# Patient Record
Sex: Female | Born: 1995
Health system: Southern US, Community
[De-identification: ages and names within clinical notes are randomized; demographics above are authoritative.]

## PROBLEM LIST (undated history)

## (undated) DIAGNOSIS — A749 Chlamydial infection, unspecified: Secondary | ICD-10-CM

## (undated) DIAGNOSIS — B009 Herpesviral infection, unspecified: Secondary | ICD-10-CM

## (undated) DIAGNOSIS — B3731 Acute candidiasis of vulva and vagina: Secondary | ICD-10-CM

## (undated) DIAGNOSIS — Z8616 Personal history of COVID-19: Secondary | ICD-10-CM

## (undated) DIAGNOSIS — Z973 Presence of spectacles and contact lenses: Secondary | ICD-10-CM

## (undated) DIAGNOSIS — Z8619 Personal history of other infectious and parasitic diseases: Secondary | ICD-10-CM

## (undated) DIAGNOSIS — A549 Gonococcal infection, unspecified: Secondary | ICD-10-CM

## (undated) DIAGNOSIS — B373 Candidiasis of vulva and vagina: Secondary | ICD-10-CM

## (undated) HISTORY — DX: Candidiasis of vulva and vagina: B37.3

## (undated) HISTORY — DX: Acute candidiasis of vulva and vagina: B37.31

## (undated) HISTORY — DX: Herpesviral infection, unspecified: B00.9

## (undated) HISTORY — DX: Personal history of COVID-19: Z86.16

## (undated) HISTORY — DX: Chlamydial infection, unspecified: A74.9

## (undated) HISTORY — DX: Gonococcal infection, unspecified: A54.9

---
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
## 2017-01-06 DIAGNOSIS — Z3200 Encounter for pregnancy test, result unknown: Secondary | ICD-10-CM | POA: Diagnosis not present

## 2017-01-06 DIAGNOSIS — A084 Viral intestinal infection, unspecified: Secondary | ICD-10-CM | POA: Diagnosis not present

## 2017-01-06 DIAGNOSIS — Z3009 Encounter for other general counseling and advice on contraception: Secondary | ICD-10-CM | POA: Diagnosis not present

## 2017-01-06 DIAGNOSIS — Z1389 Encounter for screening for other disorder: Secondary | ICD-10-CM | POA: Diagnosis not present

## 2017-01-06 DIAGNOSIS — R1084 Generalized abdominal pain: Secondary | ICD-10-CM | POA: Diagnosis not present

## 2017-01-30 DIAGNOSIS — N760 Acute vaginitis: Secondary | ICD-10-CM | POA: Diagnosis not present

## 2017-01-31 DIAGNOSIS — N760 Acute vaginitis: Secondary | ICD-10-CM | POA: Diagnosis not present

## 2017-03-01 ENCOUNTER — Emergency Department (HOSPITAL_COMMUNITY)
Admission: EM | Admit: 2017-03-01 | Discharge: 2017-03-02 | Disposition: A | Discharge status: Home / Self Care | Payer: Federal, State, Local not specified - PPO | Attending: Emergency Medicine | Admitting: Emergency Medicine

## 2017-03-01 ENCOUNTER — Encounter (HOSPITAL_COMMUNITY): Payer: Self-pay | Admitting: Emergency Medicine

## 2017-03-01 DIAGNOSIS — F1010 Alcohol abuse, uncomplicated: Secondary | ICD-10-CM | POA: Diagnosis not present

## 2017-03-01 DIAGNOSIS — E876 Hypokalemia: Secondary | ICD-10-CM | POA: Insufficient documentation

## 2017-03-01 DIAGNOSIS — E860 Dehydration: Secondary | ICD-10-CM | POA: Insufficient documentation

## 2017-03-01 DIAGNOSIS — R1110 Vomiting, unspecified: Secondary | ICD-10-CM | POA: Diagnosis present

## 2017-03-01 MED ORDER — LORAZEPAM 2 MG/ML IJ SOLN: 1.0000 mg | Freq: Once | INTRAMUSCULAR | Status: DC

## 2017-03-01 MED ORDER — SODIUM CHLORIDE 0.9 % IV BOLUS (SEPSIS)
2000.0000 mL | Freq: Once | INTRAVENOUS | Status: AC
  Administered 2017-03-02: 2000 mL via INTRAVENOUS

## 2017-03-01 MED ORDER — ONDANSETRON HCL 4 MG/2ML IJ SOLN: 4.0000 mg | Freq: Once | INTRAMUSCULAR | Status: DC

## 2017-03-01 NOTE — ED Triage Notes (Signed)
Pt presents to ER with vomiting, etoh intoxication; pt states she took 6 shots of hennessy; states she made her own drinks and does not believe anyone tainted with drinks; pt vomiting in triage; pt states she is having CP and cannot breathe, pt hyperventilating;  Sister here to help answer questions and comfort patient

## 2017-03-02 LAB — BASIC METABOLIC PANEL
Anion gap: 17 — ABNORMAL HIGH (ref 5–15)
BUN: 11 mg/dL (ref 6–20)
CHLORIDE: 104 mmol/L (ref 101–111)
CO2: 16 mmol/L — ABNORMAL LOW (ref 22–32)
Calcium: 9.1 mg/dL (ref 8.9–10.3)
Creatinine, Ser: 0.72 mg/dL (ref 0.44–1.00)
GFR calc Af Amer: >60 mL/min (ref >60)
GFR calc non Af Amer: >60 mL/min (ref >60)
GLUCOSE: 127 mg/dL — AB (ref 65–99)
POTASSIUM: 2.6 mmol/L — AB (ref 3.5–5.1)
SODIUM: 137 mmol/L (ref 135–145)

## 2017-03-02 LAB — I-STAT CHEM 8, ED
BUN: 6 mg/dL (ref 6–20)
CALCIUM ION: 1.04 mmol/L — AB (ref 1.15–1.40)
CHLORIDE: 113 mmol/L — AB (ref 101–111)
CREATININE: 0.6 mg/dL (ref 0.44–1.00)
GLUCOSE: 106 mg/dL — AB (ref 65–99)
HCT: 36 % (ref 36.0–46.0)
HEMOGLOBIN: 12.2 g/dL (ref 12.0–15.0)
POTASSIUM: 4.6 mmol/L (ref 3.5–5.1)
Sodium: 143 mmol/L (ref 135–145)
TCO2: 20 mmol/L (ref 0–100)

## 2017-03-02 LAB — CBC
HCT: 40 % (ref 36.0–46.0)
Hemoglobin: 13.9 g/dL (ref 12.0–15.0)
MCH: 30.9 pg (ref 26.0–34.0)
MCHC: 34.8 g/dL (ref 30.0–36.0)
MCV: 88.9 fL (ref 78.0–100.0)
Platelets: 240 10*3/uL (ref 150–400)
RBC: 4.5 MIL/uL (ref 3.87–5.11)
RDW: 12.2 % (ref 11.5–15.5)
WBC: 13.7 10*3/uL — AB (ref 4.0–10.5)

## 2017-03-02 LAB — I-STAT TROPONIN, ED: Troponin i, poc: 0 ng/mL (ref 0.00–0.08)

## 2017-03-02 LAB — ETHANOL: ALCOHOL ETHYL (B): 217 mg/dL — AB (ref <5)

## 2017-03-02 MED ORDER — PROMETHAZINE HCL 25 MG/ML IJ SOLN
25.0000 mg | Freq: Once | INTRAMUSCULAR | Status: AC
  Administered 2017-03-02: 25 mg via INTRAVENOUS
  Filled 2017-03-02: qty 1

## 2017-03-02 MED ORDER — POTASSIUM CHLORIDE 2 MEQ/ML IV SOLN
30.0000 meq | Freq: Once | INTRAVENOUS | Status: AC
  Administered 2017-03-02: 30 meq via INTRAVENOUS
  Filled 2017-03-02: qty 15

## 2017-03-02 MED ORDER — POTASSIUM CHLORIDE 2 MEQ/ML IV SOLN
Freq: Once | INTRAVENOUS | Status: DC
  Filled 2017-03-02: qty 1000

## 2017-03-02 MED ORDER — MAGNESIUM SULFATE 2 GM/50ML IV SOLN
2.0000 g | Freq: Once | INTRAVENOUS | Status: AC
  Administered 2017-03-02: 2 g via INTRAVENOUS
  Filled 2017-03-02: qty 50

## 2017-03-02 NOTE — ED Provider Notes (Signed)
BP 111/67   Pulse 88   Temp 98 F (36.7 C) (Oral)   Resp 17   LMP 01/29/2017   SpO2 100%  Pt stable She is improved Her potassium has normalized She is awake/alert Her father is here to take her home   EKG Interpretation  Date/Time:  Sunday March 02 2017 01:20:02 EDT Ventricular Rate:  100 PR Interval:  142 QRS Duration: 81 QT Interval:  336 QTC Calculation: 434 R Axis:   85 Text Interpretation:  Sinus tachycardia Borderline T abnormalities, anterior leads Confirmed by Christy Gentles  MD, Tyreonna Czaplicki ([id number]) on 03/02/2017 1:27:29 AM         Ripley Fraise, MD 03/02/17 [phone]

## 2017-03-02 NOTE — ED Provider Notes (Signed)
[place] DEPT Provider Note   CSN: [id number] Arrival date & time: 03/01/17  2339     History   Chief Complaint Chief Complaint  Patient presents with  . Alcohol Intoxication  . Emesis  . Chest Pain    HPI Samantha Ramsey is a 21 y.o. female.  HPI Patient presents to the emergency department with alcohol intoxication.  The patient went out with friends tonight and had at least 6 shots of Hennessy.  Patient started vomiting and family brought her to the emergency department.  The patient started hyperventilating on the way to the hospital.  The patient states that she cannot stand and she was carried into the emergency department by family.  I did witness the patient stand up off the wheelchair and get in the bed.  Patient started having pain in her chest, describes it as tightness and she is breathing fast . The patient denies chest pain, shortness of breath, headache,blurred vision, neck pain, fever, cough, weakness, numbness, dizziness, anorexia, edema, abdominal pain, nausea, vomiting, diarrhea, rash, back pain, dysuria, hematemesis, bloody stool, near syncope, or syncope.  The patient made her own shots and no one else came in contact with them History reviewed. No pertinent past medical history.  There are no active problems to display for this patient.   History reviewed. No pertinent surgical history.  OB History    No data available       Home Medications    Prior to Admission medications   Not on File    Family History History reviewed. No pertinent family history.  Social History Social History  Substance Use Topics  . Smoking status: Never Smoker  . Smokeless tobacco: Never Used  . Alcohol use 3.6 oz/week    6 Shots of liquor per week     Comment: hennessy     Allergies   Patient has no known allergies.   Review of Systems Review of Systems Level V caveat applies due to alcohol intoxication  Physical Exam Updated Vital Signs LMP  01/29/2017   Physical Exam  Constitutional: She is oriented to person, place, and time. She appears well-developed and well-nourished. No distress.  HENT:  Head: Normocephalic and atraumatic.  Mouth/Throat: Oropharynx is clear and moist.  Eyes: Pupils are equal, round, and reactive to light.  Neck: Normal range of motion. Neck supple.  Cardiovascular: Normal rate, regular rhythm and normal heart sounds.  Exam reveals no gallop and no friction rub.   No murmur heard. Pulmonary/Chest: Effort normal and breath sounds normal. Tachypnea noted. No respiratory distress. She has no wheezes.  Abdominal: Soft. Bowel sounds are normal. She exhibits no distension. There is no tenderness.  Neurological: She is alert and oriented to person, place, and time. She exhibits normal muscle tone. Coordination normal.  Skin: Skin is warm and dry. Capillary refill takes less than 2 seconds. No rash noted. No erythema.  Psychiatric: Her behavior is normal. Her mood appears anxious.  Nursing note and vitals reviewed.    ED Treatments / Results  Labs (all labs ordered are listed, but only abnormal results are displayed) [place], ED    EKG  EKG Interpretation None       Radiology No results found.  Procedures Procedures (including critical care time)  Medications Ordered in ED Medications  sodium chloride 0.9 % bolus 2,000 mL (not administered)  promethazine (PHENERGAN) injection 25 mg (not administered)  Initial Impression / Assessment and Plan / ED Course  I have reviewed the triage vital signs and the nursing notes.  Pertinent labs & imaging results that were available during my care of the patient were reviewed by me and considered in my medical decision making (see chart for details).     The patient does not appear in any significant distress.  She is in no respiratory distress.  She is crying and apologizing for  getting drunk. Patient be monitored here in the emergency department.  She will have repletion of her potassium.  She is also given IV fluids and antiemetics.  Dr. Christy Gentles will follow along with rest of her care  Final Clinical Impressions(s) / ED Diagnoses   Final diagnoses:  None    New Prescriptions New Prescriptions   No medications on file     Dalia Heading, PA-C 03/03/17 0104    Ripley Fraise, MD 03/04/17 [phone]

## 2017-03-28 DIAGNOSIS — Z113 Encounter for screening for infections with a predominantly sexual mode of transmission: Secondary | ICD-10-CM | POA: Diagnosis not present

## 2017-03-28 DIAGNOSIS — Z1389 Encounter for screening for other disorder: Secondary | ICD-10-CM | POA: Diagnosis not present

## 2017-03-28 DIAGNOSIS — N760 Acute vaginitis: Secondary | ICD-10-CM | POA: Diagnosis not present

## 2017-05-09 DIAGNOSIS — J0100 Acute maxillary sinusitis, unspecified: Secondary | ICD-10-CM | POA: Diagnosis not present

## 2017-05-09 DIAGNOSIS — J309 Allergic rhinitis, unspecified: Secondary | ICD-10-CM | POA: Diagnosis not present

## 2017-05-29 DIAGNOSIS — K080 Exfoliation of teeth due to systemic causes: Secondary | ICD-10-CM | POA: Diagnosis not present

## 2017-05-29 DIAGNOSIS — Z1281 Encounter for screening for malignant neoplasm of oral cavity: Secondary | ICD-10-CM | POA: Diagnosis not present

## 2017-05-29 DIAGNOSIS — K05329 Chronic periodontitis, generalized, unspecified severity: Secondary | ICD-10-CM | POA: Diagnosis not present

## 2017-05-29 DIAGNOSIS — K029 Dental caries, unspecified: Secondary | ICD-10-CM | POA: Diagnosis not present

## 2017-07-09 DIAGNOSIS — B373 Candidiasis of vulva and vagina: Secondary | ICD-10-CM | POA: Diagnosis not present

## 2017-08-04 DIAGNOSIS — N899 Noninflammatory disorder of vagina, unspecified: Secondary | ICD-10-CM | POA: Diagnosis not present

## 2017-08-11 DIAGNOSIS — K080 Exfoliation of teeth due to systemic causes: Secondary | ICD-10-CM | POA: Diagnosis not present

## 2017-08-22 DIAGNOSIS — K080 Exfoliation of teeth due to systemic causes: Secondary | ICD-10-CM | POA: Diagnosis not present

## 2017-08-27 DIAGNOSIS — Z113 Encounter for screening for infections with a predominantly sexual mode of transmission: Secondary | ICD-10-CM | POA: Diagnosis not present

## 2017-09-11 DIAGNOSIS — R102 Pelvic and perineal pain: Secondary | ICD-10-CM | POA: Diagnosis not present

## 2017-09-11 DIAGNOSIS — S3140XA Unspecified open wound of vagina and vulva, initial encounter: Secondary | ICD-10-CM | POA: Diagnosis not present

## 2017-09-11 DIAGNOSIS — Z1389 Encounter for screening for other disorder: Secondary | ICD-10-CM | POA: Diagnosis not present

## 2017-09-23 ENCOUNTER — Encounter: Payer: Self-pay | Admitting: Maternal Newborn

## 2017-09-23 ENCOUNTER — Ambulatory Visit (INDEPENDENT_AMBULATORY_CARE_PROVIDER_SITE_OTHER): Payer: Federal, State, Local not specified - PPO | Admitting: Maternal Newborn

## 2017-09-23 VITALS — BP 100/60 | HR 78 | Ht 59.0 in | Wt 115.0 lb

## 2017-09-23 DIAGNOSIS — Z3041 Encounter for surveillance of contraceptive pills: Secondary | ICD-10-CM | POA: Diagnosis not present

## 2017-09-23 DIAGNOSIS — B373 Candidiasis of vulva and vagina: Secondary | ICD-10-CM

## 2017-09-23 DIAGNOSIS — Z01419 Encounter for gynecological examination (general) (routine) without abnormal findings: Secondary | ICD-10-CM | POA: Insufficient documentation

## 2017-09-23 DIAGNOSIS — Z113 Encounter for screening for infections with a predominantly sexual mode of transmission: Secondary | ICD-10-CM

## 2017-09-23 DIAGNOSIS — B3731 Acute candidiasis of vulva and vagina: Secondary | ICD-10-CM

## 2017-09-23 MED ORDER — LO LOESTRIN FE 1 MG-10 MCG / 10 MCG PO TABS: 1.0000 | ORAL_TABLET | Freq: Every day | ORAL | 3 refills | Status: DC

## 2017-09-23 MED ORDER — FLUCONAZOLE 150 MG PO TABS: ORAL_TABLET | ORAL | 0 refills | Status: DC

## 2017-09-23 NOTE — Progress Notes (Signed)
Gynecology Annual Exam  PCP: Patient, No Pcp Per  Chief Complaint: Needs annual exam, yeast infections, vaginal wall injury.  History of Present Illness: Patient is a 21 y.o. G0P0 presenting for an annual exam. The patient has had recurrent yeast infections for the past eight months, usually following her menstrual cycle, with symptoms of burning, itching, and abnormal thick white or yellow discharge. She recently was told by another provider that she has a fissure in her posterior vaginal wall. She says that this occurred during intercourse.   LMP: Patient's last menstrual period was 09/16/2017 (exact date). Average Interval: regular, 28 days Duration of flow: a few days Heavy Menses: no Clots: no Intermenstrual Bleeding: no Postcoital Bleeding: no Dysmenorrhea: no  The patient is sexually active. She currently uses OCP (estrogen/progesterone) for contraception. She admits to dyspareunia.  The patient does not perform self breast exams.  There is no notable family history of breast or ovarian cancer in her family.  The patient wears seatbelts: yes.  The patient has regular exercise: yes.    The patient denies current symptoms of depression.    Review of Systems  Constitutional: Negative for fever, malaise/fatigue and weight loss.  HENT: Negative for congestion, hearing loss, sinus pain and sore throat.   Eyes: Negative for blurred vision, discharge and redness.  Respiratory: Negative for cough, shortness of breath and wheezing.   Cardiovascular: Negative for chest pain and palpitations.  Gastrointestinal: Positive for constipation and nausea. Negative for abdominal pain, heartburn and vomiting.       Nausea with OCPs  Genitourinary: Negative for dysuria, flank pain, frequency and urgency.       Abnormal discharge, vaginal pain, vulvar burning, itching  Musculoskeletal: Negative.   Skin: Negative for itching and rash.  Neurological: Negative.   Endo/Heme/Allergies: Negative.    Psychiatric/Behavioral: Negative.   All other systems reviewed and are negative.   Past Medical History:  History reviewed. No pertinent past medical history.  Past Surgical History:  Past Surgical History:  Procedure Laterality Date  . WISDOM TOOTH EXTRACTION  10/2016   all four;     Gynecologic History:  Patient's last menstrual period was 09/16/2017 (exact date). Contraception: OCP (estrogen/progesterone) Last Pap: N/A, not indicated due to age  Obstetric History: No obstetric history on file.  Family History:  History reviewed. No pertinent family history.  Social History:  Social History   Socioeconomic History  . Marital status: Single    Spouse name: Not on file  . Number of children: Not on file  . Years of education: Not on file  . Highest education level: Not on file  Social Needs  . Financial resource strain: Not on file  . Food insecurity - worry: Not on file  . Food insecurity - inability: Not on file  . Transportation needs - medical: Not on file  . Transportation needs - non-medical: Not on file  Occupational History  . Not on file  Tobacco Use  . Smoking status: Never Smoker  . Smokeless tobacco: Never Used  Substance and Sexual Activity  . Alcohol use: Yes    Alcohol/week: 3.6 oz    Types: 6 Shots of liquor per week    Comment: hennessy  . Drug use: No  . Sexual activity: Yes    Birth control/protection: Pill  Other Topics Concern  . Not on file  Social History Narrative  . Not on file    Allergies:  No Known Allergies  Medications: Junel Fe 1/20  Physical Exam Vitals: Blood pressure 100/60, pulse 78, height 4\' 11"  (1.499 m), weight 115 lb (52.2 kg), last menstrual period 09/16/2017.  General: NAD HEENT: normocephalic, anicteric Thyroid: no enlargement, no palpable nodules Pulmonary: No increased work of breathing, CTAB Cardiovascular: RRR, distal pulses 2+ Breast: Breasts symmetrical, no tenderness, no palpable nodules or  masses, no skin or nipple retraction present, no nipple discharge. Bilateral nipple piercings without redness or signs of infection. No axillary or supraclavicular lymphadenopathy. Abdomen: NABS, soft, non-tender, non-distended.  Umbilicus without lesions.  No hepatomegaly, splenomegaly or masses palpable. No evidence of hernia  Genitourinary:  External: Normal external female genitalia.  Normal urethral meatus, normal Bartholin's and Skene's  glands.    Vagina: Erythematous vaginal mucosa, approximately 2 cm long healing fissure on posterior vaginal  wall, thick yellowish white discharge present,  no evidence of prolapse.    Cervix: Grossly normal in appearance, no bleeding  Uterus: Non-enlarged, mobile, normal contour.  No CMT  Adnexa: ovaries non-enlarged, no adnexal masses  Rectal: deferred  Lymphatic: no evidence of inguinal lymphadenopathy Extremities: no edema, erythema, or tenderness Neurologic: Grossly intact Psychiatric: mood appropriate, affect full  Assessment: 21 y.o. No obstetric history on file. routine annual exam with recurrent yeast infections and vaginal wall fissure.  Plan: Problem List Items Addressed This Visit    Encounter for annual routine gynecological examination    Other Visit Diagnoses    Screen for STD (sexually transmitted disease)    -  Primary   Relevant Orders   NuSwab VG+, Candida 6sp   Yeast infection involving the vagina and surrounding area       Relevant Medications   fluconazole (DIFLUCAN) 150 MG tablet   Other Relevant Orders   NuSwab VG+, Candida 6sp      1) STI screening was offered and accepted.  2) Cervical cancer screening is not indicated as patient is less than 11 years old.  3) Contraception - patient asks if there are oral contraceptives available with a reduced dose of hormones as she is currently experiencing nausea when she takes her pills. Rx sent for Lo LoEstrin and sample pack given.  4)  Fluconazole maintenance therapy  prescribed for recurrent yeast infections and NuSwab sent. Will advise patient if any change in therapy is indicated based on results.  5) Discussed abstaining from intercourse to give her vaginal fissure time to heal, at least until she has completed the first week of treatment and/or her vaginal pain has decreased to the point where she can tolerate intercourse without pain.  Follow up 1 year for routine annual exam, or sooner if symptoms worsen or fail to improve.  Avel Sensor, CNM 09/23/2017  9:33 PM

## 2017-10-02 LAB — NUSWAB VG+, CANDIDA 6SP
CANDIDA PARAPSILOSIS, NAA: NEGATIVE
CHLAMYDIA TRACHOMATIS, NAA: NEGATIVE
Candida albicans, NAA: POSITIVE — AB
Candida glabrata, NAA: NEGATIVE
Candida krusei, NAA: NEGATIVE
Candida lusitaniae, NAA: NEGATIVE
Candida tropicalis, NAA: NEGATIVE
Neisseria gonorrhoeae, NAA: NEGATIVE
TRICH VAG BY NAA: NEGATIVE

## 2017-11-20 DIAGNOSIS — K080 Exfoliation of teeth due to systemic causes: Secondary | ICD-10-CM | POA: Diagnosis not present

## 2017-12-08 ENCOUNTER — Other Ambulatory Visit: Payer: Self-pay | Admitting: Maternal Newborn

## 2017-12-09 ENCOUNTER — Ambulatory Visit (HOSPITAL_COMMUNITY)
Admission: EM | Admit: 2017-12-09 | Discharge: 2017-12-09 | Disposition: A | Discharge status: Home / Self Care | Payer: Federal, State, Local not specified - PPO | Attending: Family Medicine | Admitting: Family Medicine

## 2017-12-09 ENCOUNTER — Encounter (HOSPITAL_COMMUNITY): Payer: Self-pay | Admitting: Emergency Medicine

## 2017-12-09 DIAGNOSIS — J029 Acute pharyngitis, unspecified: Secondary | ICD-10-CM

## 2017-12-09 MED ORDER — CROMOLYN SODIUM 5.2 MG/ACT NA AERS: 1.0000 | INHALATION_SPRAY | Freq: Four times a day (QID) | NASAL | 12 refills | Status: DC

## 2017-12-09 NOTE — ED Triage Notes (Signed)
PT C/O: sore throat onset 1 week associated w/cough and nasal drainage and HA  DENIES: fevers  TAKING MEDS: acetaminophen and Nyquil  A&O x4... NAD... Ambulatory

## 2017-12-09 NOTE — ED Provider Notes (Signed)
[facility]    CSN: [id number] Arrival date & time: 12/09/17  1527     History   Chief Complaint Chief Complaint  Patient presents with  . Sore Throat    HPI Samantha Ramsey is a 22 y.o. female.   Samantha Ramsey presents with complaints of sore throat, cough, congestion and bilateral ear pain which started in the past week but has worsened in the past 3 days. Denies gi/gu complaints. Has taken tylenol and nyquil which have not helped. No known ill contacts. No known fevers. Pain is with swallowing. Rates pain 5/10. Takes birth control but otherwise no other medications regularly, no other medical history.    ROS per HPI.       History reviewed. No pertinent past medical history.  Patient Active Problem List   Diagnosis Date Noted  . Encounter for annual routine gynecological examination 09/23/2017    Past Surgical History:  Procedure Laterality Date  . WISDOM TOOTH EXTRACTION  10/2016   all four;     OB History    Gravida Para Term Preterm AB Living   0 0 0 0 0 0   SAB TAB Ectopic Multiple Live Births   0 0 0 0 0       Home Medications    Prior to Admission medications   Medication Sig Start Date End Date Taking? Authorizing Provider  LO LOESTRIN FE 1 MG-10 MCG / 10 MCG tablet Take 1 tablet daily by mouth. 09/23/17 12/16/17 Yes Rexene Agent, CNM  cromolyn (NASALCROM) 5.2 MG/ACT nasal spray Place 1 spray into both nostrils 4 (four) times daily. 12/09/17   Zigmund Gottron, NP  fluconazole (DIFLUCAN) 150 MG tablet Take one tablet by mouth (day 1) then one tablet on day 4 and one tablet on day 7. Then take one tablet by mouth weekly for six months. 09/23/17   Rexene Agent, CNM    Family History History reviewed. No pertinent family history.  Social History Social History   Tobacco Use  . Smoking status: Never Smoker  . Smokeless tobacco: Never Used  Substance Use Topics  . Alcohol use: Yes    Alcohol/week: 3.6 oz    Types: 6 Shots of  liquor per week    Comment: hennessy  . Drug use: No     Allergies   Patient has no known allergies.   Review of Systems Review of Systems   Physical Exam Triage Vital Signs ED Triage Vitals  Enc Vitals Group     BP 12/09/17 1555 (!) 106/55     Pulse Rate 12/09/17 1555 88     Resp 12/09/17 1555 20     Temp 12/09/17 1555 98.6 F (37 C)     Temp Source 12/09/17 1555 Oral     SpO2 12/09/17 1555 100 %     Weight --      Height --      Head Circumference --      Peak Flow --      Pain Score 12/09/17 1554 5     Pain Loc --      Pain Edu? --      Excl. in [place]? --    No data found.  Updated Vital Signs BP (!) 106/55 (BP Location: Left Arm)   Pulse 88   Temp 98.6 F (37 C) (Oral)   Resp 20   LMP 12/09/2017   SpO2 100%   Visual Acuity Right Eye Distance:   Left Eye Distance:  Bilateral Distance:    Right Eye Near:   Left Eye Near:    Bilateral Near:     Physical Exam  Constitutional: She is oriented to person, place, and time. She appears well-developed and well-nourished. No distress.  HENT:  Head: Normocephalic and atraumatic.  Right Ear: Tympanic membrane, external ear and ear canal normal.  Left Ear: Tympanic membrane, external ear and ear canal normal.  Nose: Nose normal.  Mouth/Throat: Uvula is midline, oropharynx is clear and moist and mucous membranes are normal. No tonsillar exudate.  Eyes: Conjunctivae and EOM are normal. Pupils are equal, round, and reactive to light.  Cardiovascular: Normal rate, regular rhythm and normal heart sounds.  Pulmonary/Chest: Effort normal and breath sounds normal.  Lymphadenopathy:    She has no cervical adenopathy.  Neurological: She is alert and oriented to person, place, and time.  Skin: Skin is warm and dry.     UC Treatments / Results  Labs (all labs ordered are listed, but only abnormal results are displayed) Labs Reviewed - No data to display  EKG  EKG Interpretation None       Radiology No  results found.  Procedures Procedures (including critical care time)  Medications Ordered in UC Medications - No data to display   Initial Impression / Assessment and Plan / UC Course  I have reviewed the triage vital signs and the nursing notes.  Pertinent labs & imaging results that were available during my care of the patient were reviewed by me and considered in my medical decision making (see chart for details).     Without acute findings on physical exam. Non toxic in appearance. Vitals stable. Consistent with viral illness. Supportive cares recommended. Push fluids to ensure adequate hydration and keep secretions thin. Tylenol and/or ibuprofen as needed for pain or fevers.  If symptoms worsen or do not improve in the next week to return to be seen or to follow up with PCP.  Patient verbalized understanding and agreeable to plan.    Final Clinical Impressions(s) / UC Diagnoses   Final diagnoses:  Viral pharyngitis    ED Discharge Orders        Ordered    cromolyn (NASALCROM) 5.2 MG/ACT nasal spray  4 times daily     12/09/17 1604       Controlled Substance Prescriptions [place] Controlled Substance Registry consulted? Not Applicable   Zigmund Gottron, NP 12/09/17 [phone]

## 2017-12-09 NOTE — Discharge Instructions (Signed)
Push fluids to ensure adequate hydration and keep secretions thin.  Tylenol and/or ibuprofen as needed for pain or fevers.  Nasal spray to help with congestion and sore throat. May continue with nyquil as needed. Throat lozenges, sprays, honey tea, or cold beverages to help with sore throat.  If symptoms worsen or do not improve in the next week to return to be seen or to follow up with your PCP.

## 2018-02-04 DIAGNOSIS — K080 Exfoliation of teeth due to systemic causes: Secondary | ICD-10-CM | POA: Diagnosis not present

## 2018-02-09 DIAGNOSIS — Z113 Encounter for screening for infections with a predominantly sexual mode of transmission: Secondary | ICD-10-CM | POA: Diagnosis not present

## 2018-02-12 ENCOUNTER — Emergency Department (HOSPITAL_COMMUNITY)
Admission: EM | Admit: 2018-02-12 | Discharge: 2018-02-12 | Disposition: A | Discharge status: Home / Self Care | Payer: Federal, State, Local not specified - PPO | Attending: Emergency Medicine | Admitting: Emergency Medicine

## 2018-02-12 ENCOUNTER — Encounter (HOSPITAL_COMMUNITY): Payer: Self-pay | Admitting: Emergency Medicine

## 2018-02-12 ENCOUNTER — Emergency Department
Admission: EM | Admit: 2018-02-12 | Discharge: 2018-02-12 | Disposition: A | Discharge status: Home / Self Care | Payer: Federal, State, Local not specified - PPO | Attending: Emergency Medicine | Admitting: Emergency Medicine

## 2018-02-12 ENCOUNTER — Other Ambulatory Visit: Payer: Self-pay

## 2018-02-12 DIAGNOSIS — R1031 Right lower quadrant pain: Secondary | ICD-10-CM | POA: Diagnosis not present

## 2018-02-12 DIAGNOSIS — R112 Nausea with vomiting, unspecified: Secondary | ICD-10-CM | POA: Diagnosis not present

## 2018-02-12 DIAGNOSIS — Z5321 Procedure and treatment not carried out due to patient leaving prior to being seen by health care provider: Secondary | ICD-10-CM | POA: Diagnosis not present

## 2018-02-12 DIAGNOSIS — K2900 Acute gastritis without bleeding: Secondary | ICD-10-CM | POA: Insufficient documentation

## 2018-02-12 DIAGNOSIS — Z79899 Other long term (current) drug therapy: Secondary | ICD-10-CM | POA: Diagnosis not present

## 2018-02-12 DIAGNOSIS — R1010 Upper abdominal pain, unspecified: Secondary | ICD-10-CM | POA: Diagnosis not present

## 2018-02-12 LAB — COMPREHENSIVE METABOLIC PANEL
ALBUMIN: 3.9 g/dL (ref 3.5–5.0)
ALT: 16 U/L (ref 14–54)
AST: 25 U/L (ref 15–41)
Alkaline Phosphatase: 54 U/L (ref 38–126)
Anion gap: 8 (ref 5–15)
BUN: 12 mg/dL (ref 6–20)
CHLORIDE: 104 mmol/L (ref 101–111)
CO2: 24 mmol/L (ref 22–32)
Calcium: 9 mg/dL (ref 8.9–10.3)
Creatinine, Ser: 0.83 mg/dL (ref 0.44–1.00)
GFR calc Af Amer: >60 mL/min (ref >60)
GFR calc non Af Amer: >60 mL/min (ref >60)
GLUCOSE: 126 mg/dL — AB (ref 65–99)
POTASSIUM: 3.3 mmol/L — AB (ref 3.5–5.1)
Sodium: 136 mmol/L (ref 135–145)
Total Bilirubin: 0.4 mg/dL (ref 0.3–1.2)
Total Protein: 7 g/dL (ref 6.5–8.1)

## 2018-02-12 LAB — I-STAT BETA HCG BLOOD, ED (MC, WL, AP ONLY)

## 2018-02-12 LAB — URINALYSIS, ROUTINE W REFLEX MICROSCOPIC
BILIRUBIN URINE: NEGATIVE
Glucose, UA: NEGATIVE mg/dL
Ketones, ur: 5 mg/dL — AB
Nitrite: NEGATIVE
PROTEIN: NEGATIVE mg/dL
SPECIFIC GRAVITY, URINE: 1.02 (ref 1.005–1.030)
pH: 5 (ref 5.0–8.0)

## 2018-02-12 LAB — CBC
HEMATOCRIT: 38.6 % (ref 36.0–46.0)
Hemoglobin: 12.9 g/dL (ref 12.0–15.0)
MCH: 29.9 pg (ref 26.0–34.0)
MCHC: 33.4 g/dL (ref 30.0–36.0)
MCV: 89.4 fL (ref 78.0–100.0)
Platelets: 214 10*3/uL (ref 150–400)
RBC: 4.32 MIL/uL (ref 3.87–5.11)
RDW: 12.3 % (ref 11.5–15.5)
WBC: 8.6 10*3/uL (ref 4.0–10.5)

## 2018-02-12 LAB — LIPASE, BLOOD: LIPASE: 29 U/L (ref 11–51)

## 2018-02-12 MED ORDER — SODIUM CHLORIDE 0.9 % IV SOLN
1000.0000 mL | Freq: Once | INTRAVENOUS | Status: AC
  Administered 2018-02-12: 1000 mL via INTRAVENOUS

## 2018-02-12 MED ORDER — CLINDAMYCIN HCL 300 MG PO CAPS: 300.0000 mg | ORAL_CAPSULE | Freq: Two times a day (BID) | ORAL | 0 refills | Status: AC

## 2018-02-12 MED ORDER — GI COCKTAIL ~~LOC~~
30.0000 mL | Freq: Once | ORAL | Status: AC
  Administered 2018-02-12: 30 mL via ORAL
  Filled 2018-02-12: qty 30

## 2018-02-12 MED ORDER — ONDANSETRON HCL 4 MG/2ML IJ SOLN
4.0000 mg | Freq: Once | INTRAMUSCULAR | Status: AC
  Administered 2018-02-12: 4 mg via INTRAVENOUS
  Filled 2018-02-12: qty 2

## 2018-02-12 NOTE — ED Triage Notes (Signed)
Reports lower abdominal pain for about a week with nausea.  Endorses with loose stool yesterday.

## 2018-02-12 NOTE — ED Notes (Signed)
Call from [facility] that pt is there trying to check in

## 2018-02-12 NOTE — ED Provider Notes (Signed)
[facility] Emergency Department Provider Note   ____________________________________________    I have reviewed the triage vital signs and the nursing notes.   HISTORY  Chief Complaint Emesis     HPI Samantha Ramsey is a 22 y.o. female who presents with complaints of primarily nausea vomiting and upper abdominal cramping.  She does report that she had diarrhea yesterday however that is resolved.  She reports eating seems to bring on the discomfort and nausea.  She describes moderate cramping after eating.  Currently has mild cramping in the upper abdomen.  No history of abdominal surgeries.  Recently started metronidazole 2 days ago for bacterial vaginosis.  No recent travel.  No blood in her vomitus.  Has not taken anything for her nausea   No past medical history on file.  Patient Active Problem List   Diagnosis Date Noted  . Encounter for annual routine gynecological examination 09/23/2017    Past Surgical History:  Procedure Laterality Date  . WISDOM TOOTH EXTRACTION  10/2016   all four;     Prior to Admission medications   Medication Sig Start Date End Date Taking? Authorizing Provider  LO LOESTRIN FE 1 MG-10 MCG / 10 MCG tablet Take 1 tablet daily by mouth. 09/23/17 02/12/18 Yes Rexene Agent, CNM  clindamycin (CLEOCIN) 300 MG capsule Take 1 capsule (300 mg total) by mouth 2 (two) times daily for 7 days. 02/12/18 02/19/18  Lavonia Drafts, MD  cromolyn (NASALCROM) 5.2 MG/ACT nasal spray Place 1 spray into both nostrils 4 (four) times daily. Patient not taking: Reported on 02/12/2018 12/09/17   Augusto Gamble B, NP  fluconazole (DIFLUCAN) 150 MG tablet TAKE 1 TABLET BY MOUTH ONCE ON DAY 1, THEN 1 TABLET ON DAY 4, AND 1 ON DAY 7. THEN TAKE 1 TABLET WEEKLY FOR SIX MONTHS Patient not taking: Reported on 02/12/2018 12/10/17   Rexene Agent, CNM     Allergies Patient has no known allergies.  No family history on file.  Social  History Social History   Tobacco Use  . Smoking status: Never Smoker  . Smokeless tobacco: Never Used  Substance Use Topics  . Alcohol use: Yes    Alcohol/week: 3.6 oz    Types: 6 Shots of liquor per week    Comment: hennessy  . Drug use: No    Review of Systems  Constitutional: Subjective fever Eyes: No visual changes.  ENT: No sore throat. Cardiovascular: Denies chest pain. Respiratory: Denies shortness of breath. Gastrointestinal: As above Genitourinary: Negative for dysuria.  Currently being treated for bacterial vaginosis Musculoskeletal: Negative for joint swelling. Skin: Negative for rash. Neurological: Negative for headaches   ____________________________________________   PHYSICAL EXAM:  VITAL SIGNS: ED Triage Vitals [02/12/18 0627]  Enc Vitals Group     BP 110/61     Pulse Rate 90     Resp 18     Temp 98.4 F (36.9 C)     Temp Source Oral     SpO2 97 %     Weight 53.1 kg (117 lb)     Height 1.499 m (4\' 11" )     Head Circumference      Peak Flow      Pain Score 9     Pain Loc      Pain Edu?      Excl. in [place]?     Constitutional: Alert and oriented. Pleasant and interactive Eyes: Conjunctivae are normal.   Nose: No congestion/rhinnorhea. Mouth/Throat: Mucous  membranes are moist.   Neck:  Painless ROM Cardiovascular: Normal rate, regular rhythm. Grossly normal heart sounds.  Good peripheral circulation. Respiratory: Normal respiratory effort.  No retractions.  Gastrointestinal: Mild tenderness in the upper abdomen. No distention.  No CVA tenderness. Genitourinary: deferred Musculoskeletal:  Warm and well perfused Neurologic:  Normal speech and language. No gross focal neurologic deficits are appreciated.  Skin:  Skin is warm, dry and intact. No rash noted. Psychiatric: Mood and affect are normal. Speech and behavior are normal.  ____________________________________________   LABS (all labs ordered are listed, but only abnormal results are  displayed)  Labs Reviewed - No data to display ____________________________________________  EKG  None ____________________________________________  RADIOLOGY   ____________________________________________   PROCEDURES  Procedure(s) performed: No  Procedures   Critical Care performed: No ____________________________________________   INITIAL IMPRESSION / ASSESSMENT AND PLAN / ED COURSE  Pertinent labs & imaging results that were available during my care of the patient were reviewed by me and considered in my medical decision making (see chart for details).  Patient presents with gastritis type symptoms, this may be due to metronidazole or possibly viral.  Lab work performed at outside hospital is reassuring.  Abdominal exam is overall unremarkable, will treat with IV fluids, IV Zofran, GI cocktail and reevaluate.  Patient had complete resolution of discomfort with GI cocktail.  Suspect this is related to metronidazole, will DC metronidazole and start clindamycin for bacterial vaginosis    ____________________________________________   FINAL CLINICAL IMPRESSION(S) / ED DIAGNOSES  Final diagnoses:  Acute gastritis without hemorrhage, unspecified gastritis type        Note:  This document was prepared using Dragon voice recognition software and may include unintentional dictation errors.    Lavonia Drafts, MD 02/12/18 [phone]

## 2018-02-12 NOTE — ED Notes (Signed)
Patient reports pain has improved to a 1 out of 10, and she is no longer nauseous post medication administration, MD made aware.

## 2018-02-12 NOTE — ED Triage Notes (Signed)
Pt in with co generalized abd pain that started at 0000 with 4 episodes of vomiting. Had some diarrhea yesterday but none today. Was at [facility] ED prior to coming in but eloped due to wait times. Did have protocols done while there.

## 2018-03-05 ENCOUNTER — Encounter (HOSPITAL_COMMUNITY): Payer: Self-pay | Admitting: Family Medicine

## 2018-03-05 ENCOUNTER — Ambulatory Visit (HOSPITAL_COMMUNITY)
Admission: EM | Admit: 2018-03-05 | Discharge: 2018-03-05 | Disposition: A | Discharge status: Home / Self Care | Payer: Federal, State, Local not specified - PPO | Attending: Family Medicine | Admitting: Family Medicine

## 2018-03-05 DIAGNOSIS — R110 Nausea: Secondary | ICD-10-CM

## 2018-03-05 DIAGNOSIS — R197 Diarrhea, unspecified: Secondary | ICD-10-CM

## 2018-03-05 DIAGNOSIS — R1031 Right lower quadrant pain: Secondary | ICD-10-CM

## 2018-03-05 LAB — POCT URINALYSIS DIP (DEVICE)
Bilirubin Urine: NEGATIVE
GLUCOSE, UA: NEGATIVE mg/dL
Leukocytes, UA: NEGATIVE
Nitrite: NEGATIVE
PROTEIN: NEGATIVE mg/dL
Specific Gravity, Urine: >=1.03 (ref 1.005–1.030)
UROBILINOGEN UA: 0.2 mg/dL (ref 0.0–1.0)
pH: 5.5 (ref 5.0–8.0)

## 2018-03-05 LAB — POCT PREGNANCY, URINE: PREG TEST UR: NEGATIVE

## 2018-03-05 MED ORDER — ONDANSETRON 4 MG PO TBDP: 4.0000 mg | ORAL_TABLET | Freq: Three times a day (TID) | ORAL | 0 refills | Status: DC | PRN

## 2018-03-05 NOTE — ED Provider Notes (Signed)
Samantha Ramsey    CSN: [id number] Arrival date & time: 03/05/18  1420     History   Chief Complaint Chief Complaint  Patient presents with  . Abdominal Pain    HPI Samantha Ramsey is a 22 y.o. female.   22 year old female comes in with mother for 4-day history of right-sided abdominal pain, diarrhea, nausea.  Patient was seen 02/12/2018 at [facility] ED, at that time was diagnosed with gastritis, possibly due to Flagyl, and was switched to clindamycin for BV.  She finished the course of clindamycin without problems 2 weeks ago.  Starting 4 days ago she has had 3-4 episodes of watery/mucousy diarrhea that is foul-smelling.  She has had nausea without vomiting.  Denies fever, chills, night sweats.  Right-sided abdominal pain that is intermittent.  Denies any aggravating or alleviating factor.  Has been eating and drinking without problems.  She has had some urinary symptoms such as frequency, dysuria, denies hematuria.  Denies vaginal discharge.  Currently on her cycle.     History reviewed. No pertinent past medical history.  Patient Active Problem List   Diagnosis Date Noted  . Encounter for annual routine gynecological examination 09/23/2017    Past Surgical History:  Procedure Laterality Date  . WISDOM TOOTH EXTRACTION  10/2016   all four;     OB History    Gravida  0   Para  0   Term  0   Preterm  0   AB  0   Living  0     SAB  0   TAB  0   Ectopic  0   Multiple  0   Live Births  0            Home Medications    Prior to Admission medications   Medication Sig Start Date End Date Taking? Authorizing Provider  cromolyn (NASALCROM) 5.2 MG/ACT nasal spray Place 1 spray into both nostrils 4 (four) times daily. Patient not taking: Reported on 02/12/2018 12/09/17   Augusto Gamble B, NP  fluconazole (DIFLUCAN) 150 MG tablet TAKE 1 TABLET BY MOUTH ONCE ON DAY 1, THEN 1 TABLET ON DAY 4, AND 1 ON DAY 7. THEN TAKE 1 TABLET WEEKLY FOR SIX MONTHS Patient  not taking: Reported on 02/12/2018 12/10/17   Rexene Agent, CNM  LO LOESTRIN FE 1 MG-10 MCG / 10 MCG tablet Take 1 tablet daily by mouth. 09/23/17 02/12/18  Rexene Agent, CNM  ondansetron (ZOFRAN ODT) 4 MG disintegrating tablet Take 1 tablet (4 mg total) by mouth every 8 (eight) hours as needed for nausea or vomiting. 03/05/18   Ok Edwards, PA-C    Family History History reviewed. No pertinent family history.  Social History Social History   Tobacco Use  . Smoking status: Never Smoker  . Smokeless tobacco: Never Used  Substance Use Topics  . Alcohol use: Yes    Alcohol/week: 3.6 oz    Types: 6 Shots of liquor per week    Comment: hennessy  . Drug use: No     Allergies   Patient has no known allergies.   Review of Systems Review of Systems  Reason unable to perform ROS: See HPI as above.     Physical Exam Triage Vital Signs ED Triage Vitals  Enc Vitals Group     BP --      Pulse Rate 03/05/18 1436 86     Resp 03/05/18 1436 18     Temp 03/05/18 1436  98.4 F (36.9 C)     Temp src --      SpO2 03/05/18 1436 96 %     Weight --      Height --      Head Circumference --      Peak Flow --      Pain Score 03/05/18 1432 5     Pain Loc --      Pain Edu? --      Excl. in [place]? --    No data found.  Updated Vital Signs Pulse 86   Temp 98.4 F (36.9 C)   Resp 18   LMP 03/05/2018   SpO2 96%   Physical Exam  Constitutional: She is oriented to person, place, and time. She appears well-developed and well-nourished.  Non-toxic appearance. She does not appear ill. No distress.  HENT:  Head: Normocephalic and atraumatic.  Eyes: Pupils are equal, round, and reactive to light. Conjunctivae are normal.  Cardiovascular: Normal rate, regular rhythm and normal heart sounds. Exam reveals no gallop and no friction rub.  No murmur heard. Pulmonary/Chest: Effort normal and breath sounds normal. She has no wheezes. She has no rales.  Abdominal: Soft. Normal appearance and  bowel sounds are normal. She exhibits no mass. There is no tenderness. There is no rigidity, no rebound, no guarding, no CVA tenderness and no tenderness at McBurney's point.  Negative obturator, psoas sign.  Neurological: She is alert and oriented to person, place, and time.  Skin: Skin is warm and dry.  Psychiatric: She has a normal mood and affect. Her behavior is normal. Judgment normal.     UC Treatments / Results  Labs (all labs ordered are listed, but only abnormal results are displayed) Labs Reviewed  POCT URINALYSIS DIP (DEVICE) - Abnormal; Notable for the following components:      Result Value   Ketones, ur TRACE (*)    Hgb urine dipstick MODERATE (*)    All other components within normal limits  POCT PREGNANCY, URINE    EKG None Radiology No results found.  Procedures Procedures (including critical care time)  Medications Ordered in UC Medications - No data to display   Initial Impression / Assessment and Plan / UC Course  I have reviewed the triage vital signs and the nursing notes.  Pertinent labs & imaging results that were available during my care of the patient were reviewed by me and considered in my medical decision making (see chart for details).    Patient recently finished course of clindamycin, and has had 4-day history of 3-4 loose stools per day.  Obtain stool sample for C. Difficile  testing, appearance does not qualify for testing.  Patient complains of right lower quadrant pain.  Not appreciated on exam.  No tenderness to McBurney's point.  Negative obturator, psoas sign. Given patient has been on multiple antibiotic recently, will monitor symptoms for now. Will provide Zofran for nausea.  Push fluids.  Bland diet, advance as tolerated.  Strict return precautions given.  Mother and patient expresses understanding and agrees to plan.  Final Clinical Impressions(s) / UC Diagnoses   Final diagnoses:  Right lower quadrant abdominal pain  Nausea  without vomiting  Diarrhea, unspecified type    ED Discharge Orders        Ordered    ondansetron (ZOFRAN ODT) 4 MG disintegrating tablet  Every 8 hours PRN     03/05/18 1527        Ok Edwards, PA-C 03/05/18 1538

## 2018-03-05 NOTE — ED Triage Notes (Signed)
Pt here for right sided abd pain and pressure with clear mucous in with BM. She hasn't had a normal BM since last week. Some nausea. Denies blood in mucous.

## 2018-03-05 NOTE — Discharge Instructions (Signed)
As discussed, stool appearance does not qualify for C. difficile testing.  Changes in bowel habits could be due to multiple antibiotic use recently.  Zofran for nausea/vomiting.  Bland diet, advance as tolerated.  Monitor for any worsening symptoms, worsening right lower quadrant pain, nausea/vomiting not controlled by medication, fever, unable to jump up and down due to pain, go to the emergency department for further evaluation needed.

## 2018-03-17 ENCOUNTER — Ambulatory Visit (INDEPENDENT_AMBULATORY_CARE_PROVIDER_SITE_OTHER): Payer: Federal, State, Local not specified - PPO | Admitting: Obstetrics and Gynecology

## 2018-03-17 ENCOUNTER — Encounter: Payer: Self-pay | Admitting: Obstetrics and Gynecology

## 2018-03-17 VITALS — BP 120/80 | Ht 59.0 in | Wt 119.0 lb

## 2018-03-17 DIAGNOSIS — B3731 Acute candidiasis of vulva and vagina: Secondary | ICD-10-CM | POA: Insufficient documentation

## 2018-03-17 DIAGNOSIS — B373 Candidiasis of vulva and vagina: Secondary | ICD-10-CM | POA: Diagnosis not present

## 2018-03-17 LAB — POCT WET PREP WITH KOH
Clue Cells Wet Prep HPF POC: NEGATIVE
KOH PREP POC: NEGATIVE
Trichomonas, UA: NEGATIVE
Yeast Wet Prep HPF POC: POSITIVE

## 2018-03-17 MED ORDER — TERCONAZOLE 0.8 % VA CREA: 1.0000 | TOPICAL_CREAM | Freq: Every day | VAGINAL | 0 refills | Status: AC

## 2018-03-17 NOTE — Progress Notes (Signed)
Samantha Merles, MD   Chief Complaint  Patient presents with  . Vaginitis  . Dysuria    HPI:      Samantha Ramsey is a 22 y.o. G0P0000 who LMP was Patient's last menstrual period was 03/05/2018., presents today for yeast vag sx of increased d/c, irritation, dysuria without odor. Sx for the past 4 days. No urin sx, LBP, belly pain, fevers. She is sex active, no new partners. S/p recent abx use. Hx of recurrent yeast vag last yr; s/p candida albicans on nuswab panel. Treated with diflucan wkly through 12/18 when pt states her Rx ran out. No yeast vag sx since, however. She uses water to wash (no soaps), uses dryer sheets and takes probiotics.   History reviewed. No pertinent past medical history.  Past Surgical History:  Procedure Laterality Date  . WISDOM TOOTH EXTRACTION  10/2016   all four;     History reviewed. No pertinent family history.  Social History   Socioeconomic History  . Marital status: Single    Spouse name: Not on file  . Number of children: Not on file  . Years of education: Not on file  . Highest education level: Not on file  Occupational History  . Not on file  Social Needs  . Financial resource strain: Not on file  . Food insecurity:    Worry: Not on file    Inability: Not on file  . Transportation needs:    Medical: Not on file    Non-medical: Not on file  Tobacco Use  . Smoking status: Never Smoker  . Smokeless tobacco: Never Used  Substance and Sexual Activity  . Alcohol use: Yes    Alcohol/week: 3.6 oz    Types: 6 Shots of liquor per week    Comment: hennessy  . Drug use: No  . Sexual activity: Yes    Birth control/protection: Pill  Lifestyle  . Physical activity:    Days per week: Not on file    Minutes per session: Not on file  . Stress: Not on file  Relationships  . Social connections:    Talks on phone: Not on file    Gets together: Not on file    Attends religious service: Not on file    Active member of club or  organization: Not on file    Attends meetings of clubs or organizations: Not on file    Relationship status: Not on file  . Intimate partner violence:    Fear of current or ex partner: Not on file    Emotionally abused: Not on file    Physically abused: Not on file    Forced sexual activity: Not on file  Other Topics Concern  . Not on file  Social History Narrative  . Not on file    Outpatient Medications Prior to Visit  Medication Sig Dispense Refill  . cromolyn (NASALCROM) 5.2 MG/ACT nasal spray Place 1 spray into both nostrils 4 (four) times daily. (Patient not taking: Reported on 02/12/2018) 26 mL 12  . fluconazole (DIFLUCAN) 150 MG tablet TAKE 1 TABLET BY MOUTH ONCE ON DAY 1, THEN 1 TABLET ON DAY 4, AND 1 ON DAY 7. THEN TAKE 1 TABLET WEEKLY FOR SIX MONTHS (Patient not taking: Reported on 02/12/2018) 9 tablet 0  . JUNEL FE 1/20 1-20 MG-MCG tablet     . LO LOESTRIN FE 1 MG-10 MCG / 10 MCG tablet Take 1 tablet daily by mouth. 84 tablet 3  .  ondansetron (ZOFRAN ODT) 4 MG disintegrating tablet Take 1 tablet (4 mg total) by mouth every 8 (eight) hours as needed for nausea or vomiting. (Patient not taking: Reported on 03/17/2018) 15 tablet 0   No facility-administered medications prior to visit.     ROS:  Review of Systems  Constitutional: Negative for fever.  Gastrointestinal: Negative for blood in stool, constipation, diarrhea, nausea and vomiting.  Genitourinary: Positive for dysuria and vaginal discharge. Negative for dyspareunia, flank pain, frequency, hematuria, urgency, vaginal bleeding and vaginal pain.  Musculoskeletal: Negative for back pain.  Skin: Negative for rash.      OBJECTIVE:   Vitals:  BP 120/80   Ht 4\' 11"  (1.499 m)   Wt 119 lb (54 kg)   LMP 03/05/2018   BMI 24.04 kg/m   Physical Exam  Constitutional: She is oriented to person, place, and time. Vital signs are normal. She appears well-developed.  Pulmonary/Chest: Effort normal.  Genitourinary: Uterus  normal. There is rash on the right labia. There is no tenderness or lesion on the right labia. There is rash on the left labia. There is no tenderness or lesion on the left labia. Uterus is not enlarged and not tender. Cervix exhibits no motion tenderness. Right adnexum displays no mass and no tenderness. Left adnexum displays no mass and no tenderness. No erythema or tenderness in the vagina. Vaginal discharge found.  Musculoskeletal: Normal range of motion.  Neurological: She is alert and oriented to person, place, and time.  Psychiatric: She has a normal mood and affect. Her behavior is normal. Thought content normal.  Vitals reviewed.   Results: Results for orders placed or performed in visit on 03/17/18 (from the past 24 hour(s))  POCT Wet Prep with KOH     Status: Abnormal   Collection Time: 03/17/18  9:14 AM  Result Value Ref Range   Trichomonas, UA Negative    Clue Cells Wet Prep HPF POC neg    Epithelial Wet Prep HPF POC  Few, Moderate, Many, Too numerous to count   Yeast Wet Prep HPF POC pos    Bacteria Wet Prep HPF POC  Few   RBC Wet Prep HPF POC     WBC Wet Prep HPF POC     KOH Prep POC Negative Negative     Assessment/Plan: Candidal vaginitis - After abx use. Pos wet prep/exam. Rx terazol-3. Cont probiotics/line dry underwear. See if recurrent yeast vag sx resume. If so, will RF wkly diflucan tx.  - Plan: POCT Wet Prep with KOH, terconazole (TERAZOL 3) 0.8 % vaginal cream    Meds ordered this encounter  Medications  . terconazole (TERAZOL 3) 0.8 % vaginal cream    Sig: Place 1 applicator vaginally at bedtime for 3 days.    Dispense:  20 g    Refill:  0    Order Specific Question:   Supervising Provider    Answer:   Gae Dry [[id number]]      Return if symptoms worsen or fail to improve.  Samantha Rubey B. Nilani Hugill, PA-C 03/17/2018 9:16 AM

## 2018-03-17 NOTE — Patient Instructions (Signed)
I value your feedback and entrusting us with your care. If you get a [facility] patient survey, I would appreciate you taking the time to let us know about your experience today. Thank you! 

## 2018-03-21 ENCOUNTER — Other Ambulatory Visit: Payer: Self-pay | Admitting: Maternal Newborn

## 2018-03-24 ENCOUNTER — Other Ambulatory Visit: Payer: Self-pay

## 2018-03-24 MED ORDER — LO LOESTRIN FE 1 MG-10 MCG / 10 MCG PO TABS: 1.0000 | ORAL_TABLET | Freq: Every day | ORAL | 3 refills | Status: DC

## 2018-04-18 ENCOUNTER — Other Ambulatory Visit: Payer: Self-pay | Admitting: Maternal Newborn

## 2018-04-20 MED ORDER — FLUCONAZOLE 150 MG PO TABS: ORAL_TABLET | ORAL | 0 refills | Status: DC

## 2018-07-19 DIAGNOSIS — A749 Chlamydial infection, unspecified: Secondary | ICD-10-CM

## 2018-07-19 HISTORY — DX: Chlamydial infection, unspecified: A74.9

## 2018-08-03 ENCOUNTER — Encounter: Payer: Self-pay | Admitting: Obstetrics and Gynecology

## 2018-08-03 ENCOUNTER — Ambulatory Visit (INDEPENDENT_AMBULATORY_CARE_PROVIDER_SITE_OTHER): Payer: Federal, State, Local not specified - PPO | Admitting: Obstetrics and Gynecology

## 2018-08-03 VITALS — BP 98/60 | Ht 59.0 in | Wt 116.0 lb

## 2018-08-03 DIAGNOSIS — Z113 Encounter for screening for infections with a predominantly sexual mode of transmission: Secondary | ICD-10-CM

## 2018-08-03 DIAGNOSIS — N898 Other specified noninflammatory disorders of vagina: Secondary | ICD-10-CM | POA: Diagnosis not present

## 2018-08-03 LAB — POCT WET PREP WITH KOH
CLUE CELLS WET PREP PER HPF POC: NEGATIVE
KOH Prep POC: NEGATIVE
Trichomonas, UA: NEGATIVE
Yeast Wet Prep HPF POC: NEGATIVE

## 2018-08-03 MED ORDER — NORETHIN ACE-ETH ESTRAD-FE 1-20 MG-MCG PO TABS: 1.0000 | ORAL_TABLET | Freq: Every day | ORAL | 0 refills | Status: DC

## 2018-08-03 NOTE — Progress Notes (Signed)
Marguerita Merles, MD   Chief Complaint  Patient presents with  . STD screening    HPI:      [name] is a 22 y.o. G0P0000 who LMP was Patient's last menstrual period was 07/28/2018., presents today for STD testing. No known exposures/sx. Had Neg STD testing 11/18. No hx of STDs. Has had increased vag d/c, with irritation, no odor. Sx usually before menses. Treated for yeast vag several times in the past. Treated with terazol 4/19 and diflucan about a month ago. No dryer sheets. Stopped taking probiotics. Annual due 11/19. Needs OCP RF till then.   Past Medical History:  Diagnosis Date  . Yeast vaginitis     Past Surgical History:  Procedure Laterality Date  . WISDOM TOOTH EXTRACTION  10/2016   all four;     History reviewed. No pertinent family history.  Social History   Socioeconomic History  . Marital status: Single    Spouse name: Not on file  . Number of children: Not on file  . Years of education: Not on file  . Highest education level: Not on file  Occupational History  . Not on file  Social Needs  . Financial resource strain: Not on file  . Food insecurity:    Worry: Not on file    Inability: Not on file  . Transportation needs:    Medical: Not on file    Non-medical: Not on file  Tobacco Use  . Smoking status: Never Smoker  . Smokeless tobacco: Never Used  Substance and Sexual Activity  . Alcohol use: Yes    Alcohol/week: 6.0 standard drinks    Types: 6 Shots of liquor per week    Comment: hennessy  . Drug use: No  . Sexual activity: Yes    Birth control/protection: Pill  Lifestyle  . Physical activity:    Days per week: Not on file    Minutes per session: Not on file  . Stress: Not on file  Relationships  . Social connections:    Talks on phone: Not on file    Gets together: Not on file    Attends religious service: Not on file    Active member of club or organization: Not on file    Attends meetings of clubs or organizations:  Not on file    Relationship status: Not on file  . Intimate partner violence:    Fear of current or ex partner: Not on file    Emotionally abused: Not on file    Physically abused: Not on file    Forced sexual activity: Not on file  Other Topics Concern  . Not on file  Social History Narrative  . Not on file    Outpatient Medications Prior to Visit  Medication Sig Dispense Refill  . JUNEL FE 1/20 1-20 MG-MCG tablet Take 1 tablet by mouth daily. as directed  0  . fluconazole (DIFLUCAN) 150 MG tablet TAKE 1 TABLET BY MOUTH ONCE ON DAY 1, THEN 1 TABLET ON DAY 4, AND 1 ON DAY 7. THEN TAKE 1 TABLET WEEKLY FOR SIX MONTHS (Patient not taking: Reported on 08/03/2018) 9 tablet 0  . cromolyn (NASALCROM) 5.2 MG/ACT nasal spray Place 1 spray into both nostrils 4 (four) times daily. (Patient not taking: Reported on 02/12/2018) 26 mL 12  . LO LOESTRIN FE 1 MG-10 MCG / 10 MCG tablet Take 1 tablet by mouth daily. 84 tablet 3  . ondansetron (ZOFRAN ODT) 4 MG disintegrating  tablet Take 1 tablet (4 mg total) by mouth every 8 (eight) hours as needed for nausea or vomiting. (Patient not taking: Reported on 03/17/2018) 15 tablet 0   No facility-administered medications prior to visit.     ROS:  Review of Systems  Constitutional: Negative for fever.  Gastrointestinal: Negative for blood in stool, constipation, diarrhea, nausea and vomiting.  Genitourinary: Positive for vaginal discharge. Negative for dyspareunia, dysuria, flank pain, frequency, hematuria, urgency, vaginal bleeding and vaginal pain.  Musculoskeletal: Negative for back pain.  Skin: Negative for rash.     OBJECTIVE:   Vitals:  BP 98/60   Ht 4\' 11"  (1.499 m)   Wt 116 lb (52.6 kg)   LMP 07/28/2018   BMI 23.43 kg/m   Physical Exam  Constitutional: She is oriented to person, place, and time. Vital signs are normal. She appears well-developed.  Pulmonary/Chest: Effort normal.  Genitourinary: Vagina normal and uterus normal. There is no  rash, tenderness or lesion on the right labia. There is no rash, tenderness or lesion on the left labia. Uterus is not enlarged and not tender. Cervix exhibits no motion tenderness. Right adnexum displays no mass and no tenderness. Left adnexum displays no mass and no tenderness. No erythema or tenderness in the vagina. No vaginal discharge found.  Musculoskeletal: Normal range of motion.  Neurological: She is alert and oriented to person, place, and time.  Psychiatric: She has a normal mood and affect. Her behavior is normal. Thought content normal.  Vitals reviewed.   Results: Results for orders placed or performed in visit on 08/03/18 (from the past 24 hour(s))  POCT Wet Prep with KOH     Status: Normal   Collection Time: 08/03/18 11:49 AM  Result Value Ref Range   Trichomonas, UA Negative    Clue Cells Wet Prep HPF POC neg    Epithelial Wet Prep HPF POC     Yeast Wet Prep HPF POC neg    Bacteria Wet Prep HPF POC     RBC Wet Prep HPF POC     WBC Wet Prep HPF POC     KOH Prep POC Negative Negative     Assessment/Plan: Screening for STD (sexually transmitted disease) - Nuswab today. Will call with results.  - Plan: NuSwab Vaginitis Plus (VG+)  Vaginal irritation - NEg wet prep. Check nuswab. Resume probiotics. - Plan: NuSwab Vaginitis Plus (VG+), POCT Wet Prep with KOH  Rx RF OCPs till 11/19 annual  Meds ordered this encounter  Medications  . norethindrone-ethinyl estradiol (JUNEL FE 1/20) 1-20 MG-MCG tablet    Sig: Take 1 tablet by mouth daily. as directed    Dispense:  3 Package    Refill:  0    Order Specific Question:   Supervising Provider    Answer:   Gae Dry [[id number]]      Return if symptoms worsen or fail to improve.  Alicia B. Copland, PA-C 08/03/2018 11:51 AM

## 2018-08-03 NOTE — Patient Instructions (Signed)
I value your feedback and entrusting us with your care. If you get a [facility] patient survey, I would appreciate you taking the time to let us know about your experience today. Thank you! 

## 2018-08-06 ENCOUNTER — Telehealth: Payer: Self-pay | Admitting: Obstetrics and Gynecology

## 2018-08-06 LAB — NUSWAB VAGINITIS PLUS (VG+)
CANDIDA GLABRATA, NAA: NEGATIVE
CHLAMYDIA TRACHOMATIS, NAA: POSITIVE — AB
Candida albicans, NAA: NEGATIVE
NEISSERIA GONORRHOEAE, NAA: NEGATIVE
TRICH VAG BY NAA: NEGATIVE

## 2018-08-06 NOTE — Telephone Encounter (Signed)
Pt aware of chlamydia. Rx azithro. Partner needs tx. Condoms. TOC at 11/19 annual. RN to notify [facility]

## 2018-08-07 ENCOUNTER — Encounter: Payer: Self-pay | Admitting: Obstetrics and Gynecology

## 2018-08-08 ENCOUNTER — Other Ambulatory Visit: Payer: Self-pay | Admitting: Obstetrics and Gynecology

## 2018-08-08 DIAGNOSIS — A749 Chlamydial infection, unspecified: Secondary | ICD-10-CM

## 2018-08-08 MED ORDER — AZITHROMYCIN 500 MG PO TABS: 1000.0000 mg | ORAL_TABLET | Freq: Once | ORAL | 0 refills | Status: AC

## 2018-08-08 NOTE — Progress Notes (Signed)
Rx azithro for chlamydia

## 2018-08-10 ENCOUNTER — Ambulatory Visit: Payer: Federal, State, Local not specified - PPO | Admitting: Obstetrics and Gynecology

## 2018-08-11 ENCOUNTER — Ambulatory Visit: Payer: Federal, State, Local not specified - PPO | Admitting: Obstetrics and Gynecology

## 2018-08-19 ENCOUNTER — Telehealth: Payer: Self-pay

## 2018-08-19 NOTE — Telephone Encounter (Signed)
Exie Parody, RN CD Nurse for [facility] Department calling to verify treatment of chlamydia 08/03/18. IT#[phone] Ext [phone]

## 2018-08-20 NOTE — Telephone Encounter (Signed)
LMVM that pt was notified via my chart & has received treatment. Advised to cb with any further questions/concerns.

## 2018-08-31 DIAGNOSIS — K080 Exfoliation of teeth due to systemic causes: Secondary | ICD-10-CM | POA: Diagnosis not present

## 2018-09-18 DIAGNOSIS — A549 Gonococcal infection, unspecified: Secondary | ICD-10-CM

## 2018-09-18 HISTORY — DX: Gonococcal infection, unspecified: A54.9

## 2018-10-02 ENCOUNTER — Other Ambulatory Visit (HOSPITAL_COMMUNITY)
Admission: RE | Admit: 2018-10-02 | Discharge: 2018-10-02 | Disposition: A | Discharge status: Home / Self Care | Payer: Federal, State, Local not specified - PPO | Source: Ambulatory Visit | Attending: Maternal Newborn | Admitting: Maternal Newborn

## 2018-10-02 ENCOUNTER — Ambulatory Visit (INDEPENDENT_AMBULATORY_CARE_PROVIDER_SITE_OTHER): Payer: Federal, State, Local not specified - PPO | Admitting: Maternal Newborn

## 2018-10-02 ENCOUNTER — Encounter: Payer: Self-pay | Admitting: Maternal Newborn

## 2018-10-02 VITALS — BP 96/60 | Ht 59.0 in | Wt 118.0 lb

## 2018-10-02 DIAGNOSIS — Z01419 Encounter for gynecological examination (general) (routine) without abnormal findings: Secondary | ICD-10-CM

## 2018-10-02 DIAGNOSIS — Z3041 Encounter for surveillance of contraceptive pills: Secondary | ICD-10-CM

## 2018-10-02 DIAGNOSIS — Z113 Encounter for screening for infections with a predominantly sexual mode of transmission: Secondary | ICD-10-CM | POA: Diagnosis not present

## 2018-10-02 MED ORDER — NORETHIN ACE-ETH ESTRAD-FE 1-20 MG-MCG PO TABS
1.0000 | ORAL_TABLET | Freq: Every day | ORAL | 3 refills | Status: DC
Start: 2018-10-02 — End: 2019-11-11

## 2018-10-02 NOTE — Progress Notes (Signed)
Gynecology Annual Exam  PCP: Marguerita Merles, MD  Chief Complaint:  Chief Complaint  Patient presents with  . Gynecologic Exam    History of Present Illness: Patient is a 22 y.o. G0P0000 presents for annual exam. The patient has no complaints today.   LMP: Patient's last menstrual period was 09/22/2018 (exact date). Average Interval: regular, 28 days Duration of flow: a few days Heavy Menses: no Clots: no Intermenstrual Bleeding: no Postcoital Bleeding: no Dysmenorrhea: no  The patient is sexually active. She currently uses OCP (estrogen/progesterone) for contraception. She denies dyspareunia.  The patient does not perform self breast exams.  There is no notable family history of breast or ovarian cancer in her family.  The patient wears seatbelts: yes.  The patient has regular exercise: no.    The patient denies current symptoms of depression.    Review of Systems  Constitutional: Negative.   HENT: Negative.   Eyes: Negative.   Respiratory: Negative for cough, shortness of breath and wheezing.   Cardiovascular: Negative for chest pain and palpitations.  Gastrointestinal: Positive for nausea. Negative for abdominal pain.       Occasionally with OCP  Genitourinary: Negative.   Musculoskeletal: Negative.   Skin: Negative.   Neurological: Negative.   Endo/Heme/Allergies: Negative.   Psychiatric/Behavioral: Negative.   All other systems reviewed and are negative.   Past Medical History:  Past Medical History:  Diagnosis Date  . Yeast vaginitis     Past Surgical History:  Past Surgical History:  Procedure Laterality Date  . WISDOM TOOTH EXTRACTION  10/2016   all four;     Gynecologic History:  Patient's last menstrual period was 09/22/2018 (exact date). Contraception: OCP (estrogen/progesterone) Last Pap: N/A, less than 45 years old.  Obstetric History: G0P0000  Family History:  History reviewed. No pertinent family history.  Social History:  Social  History   Socioeconomic History  . Marital status: Single    Spouse name: Not on file  . Number of children: Not on file  . Years of education: Not on file  . Highest education level: Not on file  Occupational History  . Not on file  Social Needs  . Financial resource strain: Not on file  . Food insecurity:    Worry: Not on file    Inability: Not on file  . Transportation needs:    Medical: Not on file    Non-medical: Not on file  Tobacco Use  . Smoking status: Never Smoker  . Smokeless tobacco: Never Used  Substance and Sexual Activity  . Alcohol use: Not Currently    Alcohol/week: 6.0 standard drinks    Types: 6 Shots of liquor per week    Comment: hennessy  . Drug use: No  . Sexual activity: Yes    Birth control/protection: Pill  Lifestyle  . Physical activity:    Days per week: Not on file    Minutes per session: Not on file  . Stress: Not on file  Relationships  . Social connections:    Talks on phone: Not on file    Gets together: Not on file    Attends religious service: Not on file    Active member of club or organization: Not on file    Attends meetings of clubs or organizations: Not on file    Relationship status: Not on file  . Intimate partner violence:    Fear of current or ex partner: Not on file    Emotionally abused: Not on  file    Physically abused: Not on file    Forced sexual activity: Not on file  Other Topics Concern  . Not on file  Social History Narrative  . Not on file    Allergies:  No Known Allergies  Medications: Prior to Admission medications   Medication Sig Start Date End Date Taking? Authorizing Provider  fluconazole (DIFLUCAN) 150 MG tablet TAKE 1 TABLET BY MOUTH ONCE ON DAY 1, THEN 1 TABLET ON DAY 4, AND 1 ON DAY 7. THEN TAKE 1 TABLET WEEKLY FOR SIX MONTHS 04/20/18  Yes Rexene Agent, CNM  norethindrone-ethinyl estradiol (JUNEL FE 1/20) 1-20 MG-MCG tablet Take 1 tablet by mouth daily. as directed 08/03/18  Yes Copland,  Deirdre Evener, PA-C    Physical Exam Vitals: Blood pressure 96/60, height 4\' 11"  (1.499 m), weight 118 lb (53.5 kg), last menstrual period 09/22/2018.  General: NAD HEENT: normocephalic, anicteric Thyroid: no enlargement, no palpable nodules Pulmonary: No increased work of breathing, CTAB Cardiovascular: RRR, no murmurs, rubs, or gallops Breast: Breasts symmetrical, no tenderness, no palpable nodules or masses, no skin or nipple retraction present, no nipple discharge.  No axillary or supraclavicular lymphadenopathy. Abdomen: Soft, non-tender, non-distended.  Umbilicus without lesions.  No hepatomegaly, splenomegaly or masses palpable. No evidence of hernia  Genitourinary:  External: Normal external female genitalia.  Normal urethral  meatus, normal Bartholin's and Skene's glands.    Vagina: Normal vaginal mucosa, no evidence of prolapse.  Moderate amount of white discharge   Cervix: Grossly normal in appearance, no bleeding  Uterus: Non-enlarged, mobile, normal contour.  No CMT  Adnexa: ovaries non-enlarged, no adnexal masses  Rectal: deferred  Lymphatic: no evidence of inguinal lymphadenopathy Extremities: no edema, erythema, or tenderness Neurologic: Grossly intact Psychiatric: mood appropriate, affect full  Assessment: 22 y.o. G0P0000 here for an annual exam.   Plan: Problem List Items Addressed This Visit    None    Visit Diagnoses    Women's annual routine gynecological examination    -  Primary   Relevant Orders   Cervicovaginal ancillary only   HEP, RPR, HIV Panel   Screen for STD (sexually transmitted disease)       Relevant Orders   Cervicovaginal ancillary only   HEP, RPR, HIV Panel      1)  STI screening was offered and accepted.  2) ASCCP guidelines and rationale discussed.  Begin screening next year at annual exam.  3) Contraception -  Patient is satisfied with her current OCP, no side effects except for occasional nausea.  4) Follow up 1 year for routine  annual exam.  Avel Sensor, CNM 10/02/2018  2:01 PM

## 2018-10-03 LAB — HEP, RPR, HIV PANEL
HIV Screen 4th Generation wRfx: NONREACTIVE
Hepatitis B Surface Ag: NEGATIVE
RPR: NONREACTIVE

## 2018-10-06 LAB — CERVICOVAGINAL ANCILLARY ONLY
CANDIDA VAGINITIS: POSITIVE — AB
CHLAMYDIA, DNA PROBE: NEGATIVE
Neisseria Gonorrhea: POSITIVE — AB
TRICH (WINDOWPATH): NEGATIVE

## 2018-10-07 ENCOUNTER — Ambulatory Visit (INDEPENDENT_AMBULATORY_CARE_PROVIDER_SITE_OTHER): Payer: Federal, State, Local not specified - PPO

## 2018-10-07 ENCOUNTER — Other Ambulatory Visit: Payer: Self-pay | Admitting: Maternal Newborn

## 2018-10-07 DIAGNOSIS — A549 Gonococcal infection, unspecified: Secondary | ICD-10-CM

## 2018-10-07 DIAGNOSIS — B3731 Acute candidiasis of vulva and vagina: Secondary | ICD-10-CM

## 2018-10-07 DIAGNOSIS — B373 Candidiasis of vulva and vagina: Secondary | ICD-10-CM

## 2018-10-07 MED ORDER — CEFTRIAXONE SODIUM 250 MG IJ SOLR
250.0000 mg | Freq: Once | INTRAMUSCULAR | Status: AC
  Administered 2018-10-07: 250 mg via INTRAMUSCULAR

## 2018-10-07 MED ORDER — FLUCONAZOLE 150 MG PO TABS: 150.0000 mg | ORAL_TABLET | Freq: Once | ORAL | 0 refills | Status: AC

## 2018-10-07 MED ORDER — AZITHROMYCIN 500 MG PO TABS: 1000.0000 mg | ORAL_TABLET | Freq: Once | ORAL | 0 refills | Status: AC

## 2018-10-07 MED ORDER — CEFTRIAXONE SODIUM 250 MG IJ SOLR
250.0000 mg | Freq: Once | INTRAMUSCULAR | Status: DC
Start: 2018-10-07 — End: 2018-12-15

## 2018-10-07 NOTE — Progress Notes (Signed)
Rx for azithromycin and Diflucan. Patient aware to come to clinic for Rocephin injection.

## 2018-10-08 ENCOUNTER — Encounter: Payer: Self-pay | Admitting: Maternal Newborn

## 2018-10-26 ENCOUNTER — Encounter (HOSPITAL_COMMUNITY): Payer: Self-pay

## 2018-10-26 ENCOUNTER — Ambulatory Visit (HOSPITAL_COMMUNITY)
Admission: EM | Admit: 2018-10-26 | Discharge: 2018-10-26 | Disposition: A | Discharge status: Home / Self Care | Payer: Federal, State, Local not specified - PPO | Attending: Family Medicine | Admitting: Family Medicine

## 2018-10-26 ENCOUNTER — Other Ambulatory Visit: Payer: Self-pay

## 2018-10-26 DIAGNOSIS — N898 Other specified noninflammatory disorders of vagina: Secondary | ICD-10-CM | POA: Insufficient documentation

## 2018-10-26 LAB — POCT URINALYSIS DIP (DEVICE)
Bilirubin Urine: NEGATIVE
GLUCOSE, UA: NEGATIVE mg/dL
Ketones, ur: 80 mg/dL — AB
NITRITE: NEGATIVE
Protein, ur: NEGATIVE mg/dL
Specific Gravity, Urine: 1.025 (ref 1.005–1.030)
UROBILINOGEN UA: 0.2 mg/dL (ref 0.0–1.0)
pH: 6 (ref 5.0–8.0)

## 2018-10-26 LAB — POCT PREGNANCY, URINE: Preg Test, Ur: NEGATIVE

## 2018-10-26 MED ORDER — FLUCONAZOLE 150 MG PO TABS: 150.0000 mg | ORAL_TABLET | Freq: Once | ORAL | 0 refills | Status: AC

## 2018-10-26 NOTE — Discharge Instructions (Signed)
Please take 1 tablet of diflucan to treat yeast. May repeat in 3 days if symptoms still persisting.  We are testing you for Gonorrhea, Chlamydia, Trichomonas, Yeast and Bacterial Vaginosis. We will call you if anything is positive and let you know if you require any further treatment. Please inform partners of any positive results.   Please return if symptoms not improving with treatment, development of fever, nausea, vomiting, abdominal pain.

## 2018-10-26 NOTE — ED Triage Notes (Signed)
Pt cc pt states she has a vaginal discharge x 3 days

## 2018-10-26 NOTE — ED Provider Notes (Signed)
[facility]    CSN: [id number] Arrival date & time: 10/26/18  1332     History   Chief Complaint Chief Complaint  Patient presents with  . Vaginal Discharge    HPI Samantha Ramsey is a 22 y.o. female no significant past medical history presenting today for evaluation of vaginal discharge.  Patient states she has had vaginal discharge over the past 2 to 3 days.  She has had associated burning sensation.  Burning is all the time.  She has had some small amount of itching.  Denies pelvic pain, abdominal pain.  Denies fever, nausea or vomiting.  Last menstrual period was approximately 2 weeks ago.  Patient has been on oral contraceptives.  HPI  Past Medical History:  Diagnosis Date  . Yeast vaginitis     Patient Active Problem List   Diagnosis Date Noted  . Candidal vaginitis 03/17/2018  . Encounter for annual routine gynecological examination 09/23/2017    Past Surgical History:  Procedure Laterality Date  . WISDOM TOOTH EXTRACTION  10/2016   all four;     OB History    Gravida  0   Para  0   Term  0   Preterm  0   AB  0   Living  0     SAB  0   TAB  0   Ectopic  0   Multiple  0   Live Births  0            Home Medications    Prior to Admission medications   Medication Sig Start Date End Date Taking? Authorizing Provider  norethindrone-ethinyl estradiol (JUNEL FE 1/20) 1-20 MG-MCG tablet Take 1 tablet by mouth daily. as directed 10/02/18   Rexene Agent, CNM    Family History History reviewed. No pertinent family history.  Social History Social History   Tobacco Use  . Smoking status: Never Smoker  . Smokeless tobacco: Never Used  Substance Use Topics  . Alcohol use: Not Currently    Alcohol/week: 6.0 standard drinks    Types: 6 Shots of liquor per week    Comment: hennessy  . Drug use: No     Allergies   Patient has no known allergies.   Review of Systems Review of Systems  Constitutional: Negative for  fever.  Respiratory: Negative for shortness of breath.   Cardiovascular: Negative for chest pain.  Gastrointestinal: Negative for abdominal pain, diarrhea, nausea and vomiting.  Genitourinary: Positive for vaginal discharge. Negative for dysuria, flank pain, genital sores, hematuria, menstrual problem, vaginal bleeding and vaginal pain.  Musculoskeletal: Negative for back pain.  Skin: Negative for rash.  Neurological: Negative for dizziness, light-headedness and headaches.     Physical Exam Triage Vital Signs ED Triage Vitals  Enc Vitals Group     BP 10/26/18 1407 121/68     Pulse Rate 10/26/18 1407 83     Resp 10/26/18 1407 18     Temp 10/26/18 1407 98.9 F (37.2 C)     Temp Source 10/26/18 1407 Oral     SpO2 10/26/18 1407 100 %     Weight 10/26/18 1405 117 lb (53.1 kg)     Height --      Head Circumference --      Peak Flow --      Pain Score 10/26/18 1405 8     Pain Loc --      Pain Edu? --      Excl. in [place]? --  No data found.  Updated Vital Signs BP 121/68 (BP Location: Right Arm)   Pulse 83   Temp 98.9 F (37.2 C) (Oral)   Resp 18   Wt 117 lb (53.1 kg)   LMP 10/13/2018   SpO2 100%   BMI 23.63 kg/m   Visual Acuity Right Eye Distance:   Left Eye Distance:   Bilateral Distance:    Right Eye Near:   Left Eye Near:    Bilateral Near:     Physical Exam  Constitutional: She is oriented to person, place, and time. She appears well-developed and well-nourished.  No acute distress  HENT:  Head: Normocephalic and atraumatic.  Nose: Nose normal.  Eyes: Conjunctivae are normal.  Neck: Neck supple.  Cardiovascular: Normal rate.  Pulmonary/Chest: Effort normal. No respiratory distress.  Abdominal: She exhibits no distension.  Soft, nondistended, nontender light deep palpation throughout all 4 quadrants and suprapubic area  Musculoskeletal: Normal range of motion.  Neurological: She is alert and oriented to person, place, and time.  Skin: Skin is warm and  dry.  Psychiatric: She has a normal mood and affect.  Nursing note and vitals reviewed.    UC Treatments / Results  Labs (all labs ordered are listed, but only abnormal results are displayed) Labs Reviewed  POCT URINALYSIS DIP (DEVICE) - Abnormal; Notable for the following components:      Result Value   Ketones, ur 80 (*)    Hgb urine dipstick SMALL (*)    Leukocytes, UA LARGE (*)    All other components within normal limits  CERVICOVAGINAL ANCILLARY ONLY - Abnormal; Notable for the following components:   Candida vaginitis **POSITIVE for Candida species** (*)    All other components within normal limits  URINE CULTURE  POC URINE PREG, ED  POCT PREGNANCY, URINE    EKG None  Radiology No results found.  Procedures Procedures (including critical care time)  Medications Ordered in UC Medications - No data to display  Initial Impression / Assessment and Plan / UC Course  I have reviewed the triage vital signs and the nursing notes.  Pertinent labs & imaging results that were available during my care of the patient were reviewed by me and considered in my medical decision making (see chart for details).     Vaginal discharge, swab obtained will send off to check for STDs as well as yeast and BV.  Will treat for yeast given history of this as well as burning sensation.  Diflucan provided.  Will call patient with results of swab and provide any further treatment if needed.Discussed strict return precautions. Patient verbalized understanding and is agreeable with plan.  Final Clinical Impressions(s) / UC Diagnoses   Final diagnoses:  Vaginal discharge     Discharge Instructions     Please take 1 tablet of diflucan to treat yeast. May repeat in 3 days if symptoms still persisting.  We are testing you for Gonorrhea, Chlamydia, Trichomonas, Yeast and Bacterial Vaginosis. We will call you if anything is positive and let you know if you require any further treatment.  Please inform partners of any positive results.   Please return if symptoms not improving with treatment, development of fever, nausea, vomiting, abdominal pain.     ED Prescriptions    Medication Sig Dispense Auth. Provider   fluconazole (DIFLUCAN) 150 MG tablet Take 1 tablet (150 mg total) by mouth once for 1 dose. 2 tablet Myrta Mercer, [place] C, PA-C     Controlled Substance Prescriptions [place] Controlled  Substance Registry consulted? Not Applicable   Janith Lima, [place] 10/28/18 [id number]

## 2018-10-27 LAB — CERVICOVAGINAL ANCILLARY ONLY
BACTERIAL VAGINITIS: NEGATIVE
CANDIDA VAGINITIS: POSITIVE — AB
CHLAMYDIA, DNA PROBE: NEGATIVE
NEISSERIA GONORRHEA: NEGATIVE
Trichomonas: NEGATIVE

## 2018-10-27 LAB — URINE CULTURE: Culture: NO GROWTH

## 2018-12-01 ENCOUNTER — Encounter: Payer: Self-pay | Admitting: Obstetrics and Gynecology

## 2018-12-01 ENCOUNTER — Ambulatory Visit (INDEPENDENT_AMBULATORY_CARE_PROVIDER_SITE_OTHER): Payer: Federal, State, Local not specified - PPO | Admitting: Obstetrics and Gynecology

## 2018-12-01 ENCOUNTER — Other Ambulatory Visit (HOSPITAL_COMMUNITY)
Admission: RE | Admit: 2018-12-01 | Discharge: 2018-12-01 | Disposition: A | Discharge status: Home / Self Care | Payer: Federal, State, Local not specified - PPO | Source: Ambulatory Visit | Attending: Obstetrics and Gynecology | Admitting: Obstetrics and Gynecology

## 2018-12-01 VITALS — BP 110/78 | HR 93 | Ht 59.0 in | Wt 118.8 lb

## 2018-12-01 DIAGNOSIS — Z113 Encounter for screening for infections with a predominantly sexual mode of transmission: Secondary | ICD-10-CM | POA: Insufficient documentation

## 2018-12-01 DIAGNOSIS — Z7689 Persons encountering health services in other specified circumstances: Secondary | ICD-10-CM | POA: Insufficient documentation

## 2018-12-01 DIAGNOSIS — T887XXA Unspecified adverse effect of drug or medicament, initial encounter: Secondary | ICD-10-CM

## 2018-12-01 NOTE — Progress Notes (Signed)
GYNECOLOGY CLINIC PROGRESS NOTE Subjective:     Samantha Ramsey is a 23 y.o. G0P0 female here to establish care.  Is transitioning care from [facility].  Current complaints: desires STD screening today.  Notes that she was diagnosed with Chlamydia and Gonorrhea in September and November respectively.  Had test of cure in December which was negative except for noted yeast infection for which she was treated.  Notes that she is still with current partner and endorses partner treatment, however intercourse is unprotected as she notes sensitivity to condoms (has tried sensitive and lambskin types). Personal health questionnaire reviewed: yes.   Gynecologic History Patient's last menstrual period was 11/11/2018. Contraception: OCP (estrogen/progesterone) Last Pap: has never had one.    Obstetric History OB History  Gravida Para Term Preterm AB Living  0 0 0 0 0 0  SAB TAB Ectopic Multiple Live Births  0 0 0 0 0     The following portions of the patient's history were reviewed and updated as appropriate: She  has a past medical history of Chlamydia (07/2018), Gonorrhea (09/2018), and Yeast vaginitis.   She  has a past surgical history that includes Wisdom tooth extraction (10/2016).   Her family history includes Cervical cancer in her maternal grandmother; Healthy in her father; Hypertension in her mother.   She  reports that she has never smoked. She has never used smokeless tobacco. She reports previous alcohol use of about 6.0 standard drinks of alcohol per week. She reports that she does not use drugs.   She has a current medication list which includes the following prescription(s): norethindrone-ethinyl estradiol, and the following Facility-Administered Medications: ceftriaxone.   She has No Known Allergies..  Review of Systems A comprehensive review of systems was negative except for: Gastrointestinal: positive for nausea and vomiting during the first week of OCP use.   Notes that she tries taking them at night but still has symptoms    Objective:    BP 110/78   Pulse 93   Ht 4\' 11"  (1.499 m)   Wt 118 lb 12.8 oz (53.9 kg)   LMP 11/11/2018   BMI 23.99 kg/m  General appearance: alert and no distress Head: Normocephalic, without obvious abnormality, atraumatic Neck: no adenopathy, no carotid bruit, no JVD, supple, symmetrical, trachea midline and thyroid not enlarged, symmetric, no tenderness/mass/nodules Lungs: clear to auscultation bilaterally Heart: regular rate and rhythm, S1, S2 normal, no murmur, click, rub or gallop Abdomen: soft, non-tender; bowel sounds normal; no masses,  no organomegaly Pelvic: external genitalia normal, rectovaginal septum normal.  Vagina without discharge.  Cervix normal appearing, no lesions and no motion tenderness.  Uterus mobile, nontender, normal shape and size.  Adnexae non-palpable, nontender bilaterally.  Extremities: extremities normal, atraumatic, no cyanosis or edema Skin: Skin color, texture, turgor normal. No rashes or lesions Neurologic: Grossly normal    Assessment:   Encounter to establish care with new doctor Medication side effect Screen for STD (sexually transmitted disease)  Plan:    Education reviewed: safe sex/STD prevention and self breast exams. Contraception: OCP (estrogen/progesterone).  Offered to change patient to a lower dose OCP such as Lo-Loestrin. Given sample, to begin after next menses (due in 2 weeks).  Will notify MD by phone/Mychart if medication helps and can change prescription. If sample does not work, can call in something for nausea during first week of pills.  STD screening performed again today at patient's request (only desires cervicitis screening, declines serology testing).  Requests excuse  for school today, notice given.  To f/u in 10 months for next annual exam and pap smear.       Rubie Maid, MD [facility]

## 2018-12-01 NOTE — Progress Notes (Signed)
Pt is present today to est care. Pt stated that her mother referred her to [facility] due to her mother being a pt at [facility]. Pt asked if she could get STD screening today.

## 2018-12-01 NOTE — Patient Instructions (Signed)
Preventive Care for Young Adults, Female The transition to life after high school as a young adult can be a stressful time with many changes. You may start seeing a primary care physician instead of a pediatrician. This is the time when your health care becomes your responsibility. Preventive care refers to lifestyle choices and visits with your health care provider that can promote health and wellness. What does preventive care include?  A yearly physical exam. This is also called an annual wellness visit.  Dental exams once or twice a year.  Routine eye exams. Ask your health care provider how often you should have your eyes checked.  Personal lifestyle choices, including: ? Daily care of your teeth and gums. ? Regular physical activity. ? Eating a healthy diet. ? Avoiding tobacco and drug use. ? Avoiding or limiting alcohol use. ? Practicing safe sex. ? Taking vitamin and mineral supplements as recommended by your health care provider. What happens during an annual wellness visit? Preventive care starts with a yearly visit to your primary care physician. The services and screenings done by your health care provider during your annual wellness visit will depend on your overall health, lifestyle risk factors, and family history of disease. Counseling Your health care provider may ask you questions about:  Past medical problems and your family's medical history.  Medicines or supplements you take.  Health insurance and access to health care.  Alcohol, tobacco, and drug use.  Your safety at home, work, or school.  Access to firearms.  Emotional well-being and how you cope with stress.  Relationship well-being.  Diet, exercise, and sleep habits.  Your sexual health and activity.  Your methods of birth control.  Your menstrual cycle.  Your pregnancy history. Screening You may have the following tests or measurements:  Height, weight, and BMI.  Blood pressure.   Lipid and cholesterol levels.  Tuberculosis skin test.  Skin exam.  Vision and hearing tests.  Screening test for hepatitis.  Screening tests for sexually transmitted diseases (STDs), if you are at risk.  BRCA-related cancer screening. This may be done if you have a family history of breast, ovarian, tubal, or peritoneal cancers.  Pelvic exam and Pap test. This may be done every 3 years starting at age 21. Vaccines Your health care provider may recommend certain vaccines, such as:  Influenza vaccine. This is recommended every year.  Tetanus, diphtheria, and acellular pertussis (Tdap, Td) vaccine. You may need a Td booster every 10 years.  Varicella vaccine. You may need this if you have not been vaccinated.  HPV vaccine. If you are 26 or younger, you may need three doses over 6 months.  Measles, mumps, and rubella (MMR) vaccine. You may need at least one dose of MMR. You may also need a second dose.  Pneumococcal 13-valent conjugate (PCV13) vaccine. You may need this if you have certain conditions and were not previously vaccinated.  Pneumococcal polysaccharide (PPSV23) vaccine. You may need one or two doses if you smoke cigarettes or if you have certain conditions.  Meningococcal vaccine. One dose is recommended if you are age 19-21 years and a first-year college student living in a residence hall, or if you have one of several medical conditions. You may also need additional booster doses.  Hepatitis A vaccine. You may need this if you have certain conditions or if you travel or work in places where you may be exposed to hepatitis A.  Hepatitis B vaccine. You may need this if you have   certain conditions or if you travel or work in places where you may be exposed to hepatitis B.  Haemophilus influenzae type b (Hib) vaccine. You may need this if you have certain risk factors. Talk to your health care provider about which screenings and vaccines you need and how  often you need them. What steps can I take to develop healthy behaviors?      Have regular preventive health care visits with your primary care physician and dentist.  Eat a healthy diet.  Drink enough fluid to keep your urine pale yellow.  Stay active. Exercise at least 30 minutes 5 or more days of the week.  Use alcohol responsibly.  Maintain a healthy weight.  Do not use any products that contain nicotine, such as cigarettes, chewing tobacco, and e-cigarettes. If you need help quitting, ask your health care provider.  Do not use drugs.  Practice safe sex.  Use birth control (contraception) to prevent unwanted pregnancy. If you plan to become pregnant, see your health care provider for a pre-conception visit.  Find healthy ways to manage stress. How can I protect myself from injury? Injuries from violence or accidents are the leading cause of death among young adults and can often be prevented. Take these steps to help protect yourself:  Always wear your seat belt while driving or riding in a vehicle.  Do not drive if you have been drinking alcohol. Do not ride with someone who has been drinking.  Do not drive when you are tired or distracted. Do not text while driving.  Wear a helmet and other protective equipment during sports activities.  If you have firearms in your house, make sure you follow all gun safety procedures.  Seek help if you have been bullied, physically abused, or sexually abused.  Use the Internet responsibly to avoid dangers such as online bullying and online sexual predators. What can I do to cope with stress? Young adults may face many new challenges that can be stressful, such as finding a job, going to college, moving away from home, managing money, being in a relationship, getting married, and having children. To manage stress:  Avoid known stressful situations when you can.  Exercise regularly.  Find a stress-reducing activity that works  best for you. Examples include meditation, yoga, listening to music, or reading.  Spend time in nature.  Keep a journal to write about your stress and how you respond.  Talk to your health care provider about stress. He or she may suggest counseling.  Spend time with supportive friends or family.  Do not cope with stress by: ? Drinking alcohol or using drugs. ? Smoking cigarettes. ? Eating. Where can I get more information? Learn more about preventive care and healthy habits from:  [place] and Gynecologists: KaraokeExchange.nl  U.S. Probation officer Task Force: StageSync.si  National Adolescent and [place]: StrategicRoad.nl  American Academy of Pediatrics Bright Futures: https://brightfutures.MemberVerification.co.za  Society for Adolescent Health and Medicine: MoralBlog.co.za.aspx  PodExchange.nl: ToyLending.fr This information is not intended to replace advice given to you by your health care provider. Make sure you discuss any questions you have with your health care provider. Document Released: 03/21/2016 Document Revised: 06/17/2017 Document Reviewed: 03/21/2016 Elsevier Interactive Patient Education  2019 [employer].

## 2018-12-02 LAB — CERVICOVAGINAL ANCILLARY ONLY
Bacterial vaginitis: NEGATIVE
CANDIDA VAGINITIS: NEGATIVE
CHLAMYDIA, DNA PROBE: NEGATIVE
Neisseria Gonorrhea: NEGATIVE
TRICH (WINDOWPATH): NEGATIVE

## 2018-12-15 ENCOUNTER — Encounter: Payer: Self-pay | Admitting: Family Medicine

## 2018-12-15 ENCOUNTER — Ambulatory Visit: Payer: Federal, State, Local not specified - PPO | Admitting: Family Medicine

## 2018-12-15 VITALS — BP 100/80 | HR 97 | Temp 97.9°F | Resp 12 | Ht 59.5 in | Wt 116.6 lb

## 2018-12-15 DIAGNOSIS — Z130 Encounter for screening for diseases of the blood and blood-forming organs and certain disorders involving the immune mechanism: Secondary | ICD-10-CM

## 2018-12-15 DIAGNOSIS — Z8619 Personal history of other infectious and parasitic diseases: Secondary | ICD-10-CM | POA: Insufficient documentation

## 2018-12-15 DIAGNOSIS — Z13220 Encounter for screening for lipoid disorders: Secondary | ICD-10-CM | POA: Diagnosis not present

## 2018-12-15 DIAGNOSIS — Z131 Encounter for screening for diabetes mellitus: Secondary | ICD-10-CM

## 2018-12-15 DIAGNOSIS — Z0000 Encounter for general adult medical examination without abnormal findings: Secondary | ICD-10-CM | POA: Diagnosis not present

## 2018-12-15 DIAGNOSIS — Z1159 Encounter for screening for other viral diseases: Secondary | ICD-10-CM | POA: Diagnosis not present

## 2018-12-15 DIAGNOSIS — Z23 Encounter for immunization: Secondary | ICD-10-CM

## 2018-12-15 NOTE — Progress Notes (Signed)
Name: Samantha Ramsey   MRN: [id number]    DOB: 1996/05/14   Date:12/15/2018       Progress Note  Subjective  Chief Complaint  Chief Complaint  Patient presents with  . Establish Care    HPI   Patient presents for annual CPE   Diet: she avoids fast food, she is not eating vegetables and not enough fruit , she eats yogurt daily  Exercise: not enough   USPSTF grade A and B recommendations    Office Visit from 12/15/2018 in [facility]  AUDIT-C Score  0     Depression:  Depression screen [facility] 2/9 12/15/2018  Decreased Interest 0  Down, Depressed, Hopeless 0  PHQ - 2 Score 0   Hypertension: BP Readings from Last 3 Encounters:  12/15/18 100/80  12/01/18 110/78  10/26/18 121/68   Obesity: Wt Readings from Last 3 Encounters:  12/15/18 116 lb 9.6 oz (52.9 kg)  12/01/18 118 lb 12.8 oz (53.9 kg)  10/26/18 117 lb (53.1 kg)   BMI Readings from Last 3 Encounters:  12/15/18 23.16 kg/m  12/01/18 23.99 kg/m  10/26/18 23.63 kg/m    Hep C Screening: check today  STD testing and prevention (HIV/chl/gon/syphilis): done in Nov and negative  Intimate partner violence:negative  Sexual History/Pain during Intercourse:no pain  Menstrual History/LMP/Abnormal Bleeding: cycles are regular , lasts 3 days  Incontinence Symptoms: no   Advanced Care Planning: A voluntary discussion about advance care planning including the explanation and discussion of advance directives.  Discussed health care proxy and Living will, and the patient was able to identify a health care proxy as mother .    BRCA gene screening: N/A Cervical cancer screening: due at age 71   Glucose:  Glucose, Bld  Date Value Ref Range Status  02/12/2018 126 (H) 65 - 99 mg/dL Final  03/02/2017 106 (H) 65 - 99 mg/dL Final  03/01/2017 127 (H) 65 - 99 mg/dL Final    Skin cancer: discussed atypical lesions    Patient Active Problem List   Diagnosis Date Noted  . History of gonorrhea 12/15/2018     Past Surgical History:  Procedure Laterality Date  . WISDOM TOOTH EXTRACTION  10/2016   all four;     Family History  Problem Relation Age of Onset  . Hypertension Mother   . Healthy Father   . Cervical cancer Maternal Grandmother     Social History   Socioeconomic History  . Marital status: Single    Spouse name: Not on file  . Number of children: 0  . Years of education: Not on file  . Highest education level: Some college, no degree  Occupational History  . Occupation: full time Ship broker   . Occupation: works part time     Comment: Therapist, art as a host   Social Needs  . Financial resource strain: Not hard at all  . Food insecurity:    Worry: Never true    Inability: Never true  . Transportation needs:    Medical: No    Non-medical: No  Tobacco Use  . Smoking status: Never Smoker  . Smokeless tobacco: Never Used  Substance and Sexual Activity  . Alcohol use: Not Currently    Alcohol/week: 6.0 standard drinks    Types: 6 Shots of liquor per week    Comment: hennessy  . Drug use: No  . Sexual activity: Yes    Partners: Male    Birth control/protection: Pill  Lifestyle  . Physical activity:  Days per week: 5 days    Minutes per session: 30 min  . Stress: Not at all  Relationships  . Social connections:    Talks on phone: More than three times a week    Gets together: More than three times a week    Attends religious service: 1 to 4 times per year    Active member of club or organization: Yes    Attends meetings of clubs or organizations: More than 4 times per year    Relationship status: Never married  . Intimate partner violence:    Fear of current or ex partner: No    Emotionally abused: No    Physically abused: No    Forced sexual activity: No  Other Topics Concern  . Not on file  Social History Narrative   She is going [employer] and is getting a degree in Designer, fashion/clothing      Current Outpatient Medications:  .   norethindrone-ethinyl estradiol (JUNEL FE 1/20) 1-20 MG-MCG tablet, Take 1 tablet by mouth daily. as directed, Disp: 3 Package, Rfl: 3  No Known Allergies   ROS  Constitutional: Negative for fever or weight change.  Respiratory: Negative for cough and shortness of breath.   Cardiovascular: Negative for chest pain or palpitations.  Gastrointestinal: Negative for abdominal pain, no bowel changes.  Musculoskeletal: Negative for gait problem or joint swelling.  Skin: Negative for rash.  Neurological: Negative for dizziness or headache.  No other specific complaints in a complete review of systems (except as listed in HPI above).  Objective  Vitals:   12/15/18 1436  BP: 100/80  Pulse: 97  Resp: 12  Temp: 97.9 F (36.6 C)  TempSrc: Oral  SpO2: 98%  Weight: 116 lb 9.6 oz (52.9 kg)  Height: 4' 11.5" (1.511 m)    Body mass index is 23.16 kg/m.  Physical Exam  Constitutional: Patient appears well-developed and well-nourished. No distress.  HENT: Head: Normocephalic and atraumatic. Ears: B TMs ok, no erythema or effusion; Nose: Nose normal. Mouth/Throat: Oropharynx is clear and moist. No oropharyngeal exudate. Geographic tongue Eyes: Conjunctivae and EOM are normal. Pupils are equal, round, and reactive to light. No scleral icterus.  Neck: Normal range of motion. Neck supple. No JVD present. No thyromegaly present.  Cardiovascular: Normal rate, regular rhythm and normal heart sounds.  No murmur heard. No BLE edema. Pulmonary/Chest: Effort normal and breath sounds normal. No respiratory distress. Abdominal: Soft. Bowel sounds are normal, no distension. There is no tenderness. no masses Breast: some pea size lumps, per patient present in the past and had US_ unchanged , no nipple discharge or rashes FEMALE GENITALIA:  Not done RECTAL: not done Musculoskeletal: Normal range of motion, no joint effusions. No gross deformities Neurological: he is alert and oriented to person, place,  and time. No cranial nerve deficit. Coordination, balance, strength, speech and gait are normal.  Skin: Skin is warm and dry. No rash noted. No erythema.  Psychiatric: Patient has a normal mood and affect. behavior is normal. Judgment and thought content normal.   Recent Results (from the past 2160 hour(s))  Cervicovaginal ancillary only     Status: Abnormal   Collection Time: 10/02/18 12:00 AM  Result Value Ref Range   Candida vaginitis **POSITIVE for Candida species** (A)     Comment: Normal Reference Range - Negative   Chlamydia Negative     Comment: Normal Reference Range - Negative   Neisseria gonorrhea **POSITIVE** (A)     Comment: Normal  Reference Range - Negative   Trichomonas Negative     Comment: Normal Reference Range - Negative  HEP, RPR, HIV Panel     Status: None   Collection Time: 10/02/18  2:03 PM  Result Value Ref Range   Hepatitis B Surface Ag Negative Negative   RPR Ser Ql Non Reactive Non Reactive   HIV Screen 4th Generation wRfx Non Reactive Non Reactive  Cervicovaginal ancillary only     Status: Abnormal   Collection Time: 10/26/18 12:00 AM  Result Value Ref Range   Bacterial vaginitis Negative for Bacterial Vaginitis Microorganisms     Comment: Normal Reference Range - Negative   Candida vaginitis **POSITIVE for Candida species** (A)     Comment: Normal Reference Range - Negative   Chlamydia Negative     Comment: Normal Reference Range - Negative   Neisseria gonorrhea Negative     Comment: Normal Reference Range - Negative   Trichomonas Negative     Comment: Normal Reference Range - Negative  POCT urinalysis dip (device)     Status: Abnormal   Collection Time: 10/26/18  2:39 PM  Result Value Ref Range   Glucose, UA NEGATIVE NEGATIVE mg/dL   Bilirubin Urine NEGATIVE NEGATIVE   Ketones, ur 80 (A) NEGATIVE mg/dL   Specific Gravity, Urine 1.025 1.005 - 1.030   Hgb urine dipstick SMALL (A) NEGATIVE   pH 6.0 5.0 - 8.0   Protein, ur NEGATIVE NEGATIVE mg/dL    Urobilinogen, UA 0.2 0.0 - 1.0 mg/dL   Nitrite NEGATIVE NEGATIVE   Leukocytes, UA LARGE (A) NEGATIVE    Comment: Biochemical Testing Only. Please order routine urinalysis from main lab if confirmatory testing is needed.  Pregnancy, urine POC     Status: None   Collection Time: 10/26/18  2:44 PM  Result Value Ref Range   Preg Test, Ur NEGATIVE NEGATIVE    Comment:        THE SENSITIVITY OF THIS METHODOLOGY IS >24 mIU/mL   Urine culture     Status: None   Collection Time: 10/26/18  2:46 PM  Result Value Ref Range   Specimen Description URINE, CLEAN CATCH    Special Requests NONE    Culture      NO GROWTH Performed at [facility], [place] [address] [place], [place] [address]    Report Status 10/27/2018 FINAL   Cervicovaginal ancillary only     Status: None   Collection Time: 12/01/18 12:00 AM  Result Value Ref Range   Bacterial vaginitis Negative for Bacterial Vaginitis Microorganisms     Comment: Normal Reference Range - Negative   Candida vaginitis Negative for Candida species     Comment: Normal Reference Range - Negative   Chlamydia Negative     Comment: Normal Reference Range - Negative   Neisseria gonorrhea Negative     Comment: Normal Reference Range - Negative   Trichomonas Negative     Comment: Normal Reference Range - Negative      PHQ2/9: Depression screen PHQ 2/9 12/15/2018  Decreased Interest 0  Down, Depressed, Hopeless 0  PHQ - 2 Score 0      Fall Risk: Fall Risk  12/15/2018  Falls in the past year? 0  Number falls in past yr: 0  Injury with Fall? 0     Functional Status Survey: Is the patient deaf or have difficulty hearing?: No Does the patient have difficulty seeing, even when wearing glasses/contacts?: No Does the patient have difficulty concentrating, remembering, or  making decisions?: No Does the patient have difficulty walking or climbing stairs?: No Does the patient have difficulty dressing or bathing?: No Does the patient have  difficulty doing errands alone such as visiting a doctor's office or shopping?: No   Assessment & Plan   1. Well woman exam without gynecological exam  - COMPLETE METABOLIC PANEL WITH GFR  2. History of gonorrhea   3. Screening for deficiency anemia  - CBC with Differential/Platelet  4. Lipid screening  - Lipid panel  5. Diabetes mellitus screening  - Hemoglobin A1c  6. Need for hepatitis C screening test  - Hepatitis C Antibody  7. Needs flu shot  Refused   8. Need for Tdap vaccination  Refused   9. Need for HPV vaccination  Spoke to patient and mother, they will do some research about it  -USPSTF grade A and B recommendations reviewed with patient; age-appropriate recommendations, preventive care, screening tests, etc discussed and encouraged; healthy living encouraged; see AVS for patient education given to patient -Discussed importance of 150 minutes of physical activity weekly, eat two servings of fish weekly, eat one serving of tree nuts ( cashews, pistachios, pecans, almonds.Marland Kitchen) every other day, eat 6 servings of fruit/vegetables daily and drink plenty of water and avoid sweet beverages.

## 2018-12-15 NOTE — Patient Instructions (Signed)
Preventive Care 18-39 Years, Female Preventive care refers to lifestyle choices and visits with your health care provider that can promote health and wellness. What does preventive care include?   A yearly physical exam. This is also called an annual well check.  Dental exams once or twice a year.  Routine eye exams. Ask your health care provider how often you should have your eyes checked.  Personal lifestyle choices, including: ? Daily care of your teeth and gums. ? Regular physical activity. ? Eating a healthy diet. ? Avoiding tobacco and drug use. ? Limiting alcohol use. ? Practicing safe sex. ? Taking vitamin and mineral supplements as recommended by your health care provider. What happens during an annual well check? The services and screenings done by your health care provider during your annual well check will depend on your age, overall health, lifestyle risk factors, and family history of disease. Counseling Your health care provider may ask you questions about your:  Alcohol use.  Tobacco use.  Drug use.  Emotional well-being.  Home and relationship well-being.  Sexual activity.  Eating habits.  Work and work environment.  Method of birth control.  Menstrual cycle.  Pregnancy history. Screening You may have the following tests or measurements:  Height, weight, and BMI.  Diabetes screening. This is done by checking your blood sugar (glucose) after you have not eaten for a while (fasting).  Blood pressure.  Lipid and cholesterol levels. These may be checked every 5 years starting at age 20.  Skin check.  Hepatitis C blood test.  Hepatitis B blood test.  Sexually transmitted disease (STD) testing.  BRCA-related cancer screening. This may be done if you have a family history of breast, ovarian, tubal, or peritoneal cancers.  Pelvic exam and Pap test. This may be done every 3 years starting at age 21. Starting at age 30, this may be done every 5  years if you have a Pap test in combination with an HPV test. Discuss your test results, treatment options, and if necessary, the need for more tests with your health care provider. Vaccines Your health care provider may recommend certain vaccines, such as:  Influenza vaccine. This is recommended every year.  Tetanus, diphtheria, and acellular pertussis (Tdap, Td) vaccine. You may need a Td booster every 10 years.  Varicella vaccine. You may need this if you have not been vaccinated.  HPV vaccine. If you are 26 or younger, you may need three doses over 6 months.  Measles, mumps, and rubella (MMR) vaccine. You may need at least one dose of MMR. You may also need a second dose.  Pneumococcal 13-valent conjugate (PCV13) vaccine. You may need this if you have certain conditions and were not previously vaccinated.  Pneumococcal polysaccharide (PPSV23) vaccine. You may need one or two doses if you smoke cigarettes or if you have certain conditions.  Meningococcal vaccine. One dose is recommended if you are age 19-21 years and a first-year college student living in a residence hall, or if you have one of several medical conditions. You may also need additional booster doses.  Hepatitis A vaccine. You may need this if you have certain conditions or if you travel or work in places where you may be exposed to hepatitis A.  Hepatitis B vaccine. You may need this if you have certain conditions or if you travel or work in places where you may be exposed to hepatitis B.  Haemophilus influenzae type b (Hib) vaccine. You may need this if you   have certain risk factors. Talk to your health care provider about which screenings and vaccines you need and how often you need them. This information is not intended to replace advice given to you by your health care provider. Make sure you discuss any questions you have with your health care provider. Document Released: 12/31/2001 Document Revised: 06/17/2017  Document Reviewed: 09/05/2015 Elsevier Interactive Patient Education  2019 [employer].

## 2018-12-16 LAB — CBC WITH DIFFERENTIAL/PLATELET
Absolute Monocytes: 374 cells/uL (ref 200–950)
Basophils Absolute: 22 cells/uL (ref 0–200)
Basophils Relative: 0.4 %
Eosinophils Absolute: 22 cells/uL (ref 15–500)
Eosinophils Relative: 0.4 %
HCT: 40.7 % (ref 35.0–45.0)
Hemoglobin: 14 g/dL (ref 11.7–15.5)
LYMPHS ABS: 2948 {cells}/uL (ref 850–3900)
MCH: 31.1 pg (ref 27.0–33.0)
MCHC: 34.4 g/dL (ref 32.0–36.0)
MCV: 90.4 fL (ref 80.0–100.0)
MPV: 10 fL (ref 7.5–12.5)
Monocytes Relative: 6.8 %
Neutro Abs: 2134 cells/uL (ref 1500–7800)
Neutrophils Relative %: 38.8 %
Platelets: 243 10*3/uL (ref 140–400)
RBC: 4.5 10*6/uL (ref 3.80–5.10)
RDW: 12.2 % (ref 11.0–15.0)
Total Lymphocyte: 53.6 %
WBC: 5.5 10*3/uL (ref 3.8–10.8)

## 2018-12-16 LAB — LIPID PANEL
Cholesterol: 215 mg/dL — ABNORMAL HIGH (ref <200)
HDL: 61 mg/dL (ref >50)
LDL Cholesterol (Calc): 137 mg/dL (calc) — ABNORMAL HIGH
NON-HDL CHOLESTEROL (CALC): 154 mg/dL — AB (ref <130)
TRIGLYCERIDES: 80 mg/dL (ref <150)
Total CHOL/HDL Ratio: 3.5 (calc) (ref <5.0)

## 2018-12-16 LAB — COMPLETE METABOLIC PANEL WITH GFR
AG Ratio: 1.3 (calc) (ref 1.0–2.5)
ALT: 13 U/L (ref 6–29)
AST: 16 U/L (ref 10–30)
Albumin: 4 g/dL (ref 3.6–5.1)
Alkaline phosphatase (APISO): 46 U/L (ref 33–115)
BILIRUBIN TOTAL: 0.6 mg/dL (ref 0.2–1.2)
BUN: 15 mg/dL (ref 7–25)
CO2: 25 mmol/L (ref 20–32)
Calcium: 9.3 mg/dL (ref 8.6–10.2)
Chloride: 107 mmol/L (ref 98–110)
Creat: 0.79 mg/dL (ref 0.50–1.10)
GFR, Est African American: 125 mL/min/{1.73_m2} (ref >=60)
GFR, Est Non African American: 108 mL/min/{1.73_m2} (ref >=60)
Globulin: 3 g/dL (calc) (ref 1.9–3.7)
Glucose, Bld: 88 mg/dL (ref 65–139)
Potassium: 3.8 mmol/L (ref 3.5–5.3)
Sodium: 139 mmol/L (ref 135–146)
Total Protein: 7 g/dL (ref 6.1–8.1)

## 2018-12-16 LAB — HEMOGLOBIN A1C
Hgb A1c MFr Bld: 5.2 % of total Hgb (ref <5.7)
Mean Plasma Glucose: 103 (calc)
eAG (mmol/L): 5.7 (calc)

## 2018-12-16 LAB — HEPATITIS C ANTIBODY
Hepatitis C Ab: NONREACTIVE
SIGNAL TO CUT-OFF: 0.16 (ref <1.00)

## 2018-12-21 ENCOUNTER — Encounter: Payer: Self-pay | Admitting: Family Medicine

## 2019-01-13 ENCOUNTER — Telehealth: Payer: Self-pay | Admitting: Obstetrics and Gynecology

## 2019-01-13 MED ORDER — ONDANSETRON HCL 4 MG PO TABS: 4.0000 mg | ORAL_TABLET | Freq: Three times a day (TID) | ORAL | 0 refills | Status: DC | PRN

## 2019-01-13 NOTE — Telephone Encounter (Signed)
The patient called and stated that her symptoms are not improving and was advised to notify the office so a nausea medication could be sent in to the patients pharmacy. Please advise.

## 2019-01-13 NOTE — Telephone Encounter (Signed)
Pt was called to see what type of issues she was having. Pt stated that when she starts a new BCP she become sick on the stomach and needs something to help with nausea and vomiting.

## 2019-02-19 ENCOUNTER — Telehealth: Payer: Self-pay | Admitting: Obstetrics and Gynecology

## 2019-02-19 MED ORDER — METRONIDAZOLE 500 MG PO TABS: 500.0000 mg | ORAL_TABLET | Freq: Two times a day (BID) | ORAL | 0 refills | Status: DC

## 2019-02-19 NOTE — Telephone Encounter (Signed)
Please inform patient that based on her symptoms it sounds as if she is having a bacterial vaginosis infection.  We can prescribe Flagyl (BID for 7 days, ~ $7-10) or she can be prescribed Solosec (1 time dosing for ~ $20-25), her preference.  If she is not noting any relief after several days of treatment, may need to have an in-office visit to have testing done with vaginal swabs.   Dr. Marcelline Mates

## 2019-02-19 NOTE — Telephone Encounter (Signed)
The patient called and stated that she is experiencing burning, fish odor, and thick white discharge. The patient is hoping to receive a call back today if possible, for something to be sent into her pharmacy or come into the office. Please advise.

## 2019-02-22 ENCOUNTER — Telehealth: Payer: Self-pay

## 2019-02-22 NOTE — Telephone Encounter (Signed)
Pt states her sx are worse on flagyl. Wants to be seen.   Appt made for tomorrow at 2pm. Thanks.

## 2019-02-23 ENCOUNTER — Ambulatory Visit (INDEPENDENT_AMBULATORY_CARE_PROVIDER_SITE_OTHER): Payer: Federal, State, Local not specified - PPO | Admitting: Obstetrics and Gynecology

## 2019-02-23 ENCOUNTER — Other Ambulatory Visit (HOSPITAL_COMMUNITY)
Admission: RE | Admit: 2019-02-23 | Discharge: 2019-02-23 | Disposition: A | Discharge status: Home / Self Care | Payer: Federal, State, Local not specified - PPO | Source: Ambulatory Visit | Attending: Obstetrics and Gynecology | Admitting: Obstetrics and Gynecology

## 2019-02-23 ENCOUNTER — Telehealth: Payer: Self-pay

## 2019-02-23 ENCOUNTER — Other Ambulatory Visit: Payer: Self-pay

## 2019-02-23 ENCOUNTER — Encounter: Payer: Self-pay | Admitting: Obstetrics and Gynecology

## 2019-02-23 VITALS — BP 97/65 | HR 95 | Ht 59.0 in | Wt 119.1 lb

## 2019-02-23 DIAGNOSIS — B373 Candidiasis of vulva and vagina: Secondary | ICD-10-CM | POA: Diagnosis not present

## 2019-02-23 DIAGNOSIS — B3731 Acute candidiasis of vulva and vagina: Secondary | ICD-10-CM

## 2019-02-23 DIAGNOSIS — Z113 Encounter for screening for infections with a predominantly sexual mode of transmission: Secondary | ICD-10-CM | POA: Diagnosis not present

## 2019-02-23 MED ORDER — FLUCONAZOLE 150 MG PO TABS: 150.0000 mg | ORAL_TABLET | Freq: Once | ORAL | 3 refills | Status: DC

## 2019-02-23 NOTE — Telephone Encounter (Signed)
Pt was seen in the office today.  

## 2019-02-23 NOTE — Patient Instructions (Signed)
Vaginal Yeast infection, Adult    Vaginal yeast infection is a condition that causes vaginal discharge as well as soreness, swelling, and redness (inflammation) of the vagina. This is a common condition. Some women get this infection frequently.  What are the causes?  This condition is caused by a change in the normal balance of the yeast (candida) and bacteria that live in the vagina. This change causes an overgrowth of yeast, which causes the inflammation.  What increases the risk?  The condition is more likely to develop in women who:   Take antibiotic medicines.   Have diabetes.   Take birth control pills.   Are pregnant.   Douche often.   Have a weak body defense system (immune system).   Have been taking steroid medicines for a long time.   Frequently wear tight clothing.  What are the signs or symptoms?  Symptoms of this condition include:   White, thick, creamy vaginal discharge.   Swelling, itching, redness, and irritation of the vagina. The lips of the vagina (vulva) may be affected as well.   Pain or a burning feeling while urinating.   Pain during sex.  How is this diagnosed?  This condition is diagnosed based on:   Your medical history.   A physical exam.   A pelvic exam. Your health care provider will examine a sample of your vaginal discharge under a microscope. Your health care provider may send this sample for testing to confirm the diagnosis.  How is this treated?  This condition is treated with medicine. Medicines may be over-the-counter or prescription. You may be told to use one or more of the following:   Medicine that is taken by mouth (orally).   Medicine that is applied as a cream (topically).   Medicine that is inserted directly into the vagina (suppository).  Follow these instructions at home:    Lifestyle   Do not have sex until your health care provider approves. Tell your sex partner that you have a yeast infection. That person should go to his or her health care  provider and ask if they should also be treated.   Do not wear tight clothes, such as pantyhose or tight pants.   Wear breathable cotton underwear.  General instructions   Take or apply over-the-counter and prescription medicines only as told by your health care provider.   Eat more yogurt. This may help to keep your yeast infection from returning.   Do not use tampons until your health care provider approves.   Try taking a sitz bath to help with discomfort. This is a warm water bath that is taken while you are sitting down. The water should only come up to your hips and should cover your buttocks. Do this 3-4 times per day or as told by your health care provider.   Do not douche.   If you have diabetes, keep your blood sugar levels under control.   Keep all follow-up visits as told by your health care provider. This is important.  Contact a health care provider if:   You have a fever.   Your symptoms go away and then return.   Your symptoms do not get better with treatment.   Your symptoms get worse.   You have new symptoms.   You develop blisters in or around your vagina.   You have blood coming from your vagina and it is not your menstrual period.   You develop pain in your abdomen.  Summary     Vaginal yeast infection is a condition that causes discharge as well as soreness, swelling, and redness (inflammation) of the vagina.   This condition is treated with medicine. Medicines may be over-the-counter or prescription.   Take or apply over-the-counter and prescription medicines only as told by your health care provider.   Do not douche. Do not have sex or use tampons until your health care provider approves.   Contact a health care provider if your symptoms do not get better with treatment or your symptoms go away and then return.  This information is not intended to replace advice given to you by your health care provider. Make sure you discuss any questions you have with your health care  provider.  Document Released: 08/14/2005 Document Revised: 03/23/2018 Document Reviewed: 03/23/2018  Elsevier Interactive Patient Education  2019 [employer]

## 2019-02-23 NOTE — Progress Notes (Signed)
Pt is present today due to still having vaginal itching and burning that has increased with the use of Flagyl.  Pt stated that she first noticed the issues a week before she call the office. Pt called last week stating that she was having ithcing and burning in the vaginal area. Pt was given Flagyl 500mg . Pt called stating that the Flagyl was not helping her symptoms and the symptoms were increasing.

## 2019-02-23 NOTE — Progress Notes (Signed)
   GYNECOLOGY CLINIC PROGRESS NOTE Subjective:     Samantha Ramsey is a 23 y.o. G54P0000 female who presents for evaluation of an abnormal vaginal discharge. She was treated for suspected BV symptoms last week with Flagyl, however patient notes that after 5 days of use she had to discontinue as her symptoms seemed to be getting worse.  She states that the discharge has changed from milky with an odor, to thic and clumpy "like cottage cheese". She feels like her vagina is raw and irritated. She denies abnormal bleeding, blisters and bumps.  Sexually transmitted infection risk: unknown risk of STD exposure however she does note that she has recently been sexually active with a new partner.  Does not know his status.. Menstrual flow: regular every 28-30 days.  The following portions of the patient's history were reviewed and updated as appropriate: allergies, current medications, past family history, past medical history, past social history, past surgical history and problem list.   Review of Systems Pertinent items noted in HPI and remainder of comprehensive ROS otherwise negative.    Objective:    BP 97/65   Pulse 95   Ht 4\' 11"  (1.499 m)   Wt 119 lb 1.6 oz (54 kg)   LMP 01/31/2019   BMI 24.06 kg/m  General appearance: alert and no distress Abdomen: soft, non-tender; bowel sounds normal; no masses,  no organomegaly Pelvic: external genitalia normal, rectovaginal septum normal.  Vagina with moderate thick gray curd-like discharge.  Cervix normal appearing, no lesions and no motion tenderness.  Bimanual exam not performed.     Microscopic wet-mount exam shows few clue cells, numerous hyphae, no trichomonads, numerous white blood cells. KOH done.    Assessment:    Monilial vulvo-vaginitis.   STD screening  Plan:    Symptomatic local care discussed. Vaginal antifungal see orders.   Offered STI screening in light of history of new partner (and patient has also had a h/o Chlamydia in  the past).  Patient ok with screening. GC/Cl performed today.  Safe sex practices encouraged.  Follow up if symptoms persist or worsen.    Rubie Maid, MD [facility]

## 2019-02-23 NOTE — Telephone Encounter (Signed)
Called pt see if they had received the pt's medication since the pt had called and stated that it was not at the pharmacy. Pharmacist stated they were filling the medication now. They just had to process and send it to the pt's insurance.

## 2019-02-25 LAB — CERVICOVAGINAL ANCILLARY ONLY
Bacterial vaginitis: NEGATIVE
Candida vaginitis: POSITIVE — AB
Chlamydia: NEGATIVE
Neisseria Gonorrhea: NEGATIVE
Trichomonas: NEGATIVE

## 2019-03-03 ENCOUNTER — Other Ambulatory Visit: Payer: Self-pay

## 2019-03-30 ENCOUNTER — Other Ambulatory Visit: Payer: Self-pay | Admitting: Obstetrics and Gynecology

## 2019-06-03 ENCOUNTER — Other Ambulatory Visit: Payer: Self-pay

## 2019-06-03 ENCOUNTER — Other Ambulatory Visit (HOSPITAL_COMMUNITY)
Admission: RE | Admit: 2019-06-03 | Discharge: 2019-06-03 | Disposition: A | Discharge status: Home / Self Care | Payer: Federal, State, Local not specified - PPO | Source: Ambulatory Visit | Attending: Certified Nurse Midwife | Admitting: Certified Nurse Midwife

## 2019-06-03 ENCOUNTER — Encounter: Payer: Self-pay | Admitting: Certified Nurse Midwife

## 2019-06-03 ENCOUNTER — Ambulatory Visit (INDEPENDENT_AMBULATORY_CARE_PROVIDER_SITE_OTHER): Payer: Federal, State, Local not specified - PPO | Admitting: Certified Nurse Midwife

## 2019-06-03 VITALS — BP 96/57 | HR 84 | Ht 59.0 in | Wt 119.2 lb

## 2019-06-03 DIAGNOSIS — N898 Other specified noninflammatory disorders of vagina: Secondary | ICD-10-CM | POA: Diagnosis not present

## 2019-06-03 DIAGNOSIS — Z113 Encounter for screening for infections with a predominantly sexual mode of transmission: Secondary | ICD-10-CM

## 2019-06-03 NOTE — Patient Instructions (Signed)
Vaginitis  Vaginitis is irritation and swelling (inflammation) of the vagina. It happens when normal bacteria and yeast in the vagina grow too much. There are many types of this condition. Treatment will depend on the type you have. Follow these instructions at home: Lifestyle  Keep your vagina area clean and dry. ? Avoid using soap. ? Rinse the area with water.  Do not do the following until your doctor says it is okay: ? Wash and clean out the vagina (douche). ? Use tampons. ? Have sex.  Wipe from front to back after going to the bathroom.  Let air reach your vagina. ? Wear cotton underwear. ? Do not wear: ? Underwear while you sleep. ? Tight pants. ? Thong underwear. ? Underwear or nylons without a cotton panel. ? Take off any wet clothing, such as bathing suits, as soon as possible.  Use gentle, non-scented products. Do not use things that can irritate the vagina, such as fabric softeners. Avoid the following products if they are scented: ? Feminine sprays. ? Detergents. ? Tampons. ? Feminine hygiene products. ? Soaps or bubble baths.  Practice safe sex and use condoms. General instructions  Take over-the-counter and prescription medicines only as told by your doctor.  If you were prescribed an antibiotic medicine, take or use it as told by your doctor. Do not stop taking or using the antibiotic even if you start to feel better.  Keep all follow-up visits as told by your doctor. This is important. Contact a doctor if:  You have pain in your belly.  You have a fever.  Your symptoms last for more than 2-3 days. Get help right away if:  You have a fever and your symptoms get worse all of a sudden. Summary  Vaginitis is irritation and swelling of the vagina. It can happen when the normal bacteria and yeast in the vagina grow too much. There are many types.  Treatment will depend on the type you have.  Do not douche, use tampons , or have sex until your health  care provider approves. When you can return to sex, practice safe sex and use condoms. This information is not intended to replace advice given to you by your health care provider. Make sure you discuss any questions you have with your health care provider. Document Released: 01/31/2009 Document Revised: 10/17/2017 Document Reviewed: 11/26/2016 Elsevier Patient Education  2020 [employer].

## 2019-06-03 NOTE — Progress Notes (Signed)
GYN ENCOUNTER NOTE  Subjective:       Samantha Ramsey is a 23 y.o. G0P0000 female is here for gynecologic evaluation of the following issues:  1. Thin, white vaginal discharge and irritation for the last week 2. Desires STI screening.   Denies difficulty breathing or respiratory distress, chest pain, abdominal pain, excessive vaginal bleeding, dysuria, and leg pain or swelling.    Gynecologic History  Patient's last menstrual period was 05/19/2019 (exact date).  Contraception: OCP (estrogen/progesterone)  Last Pap: n/A  Obstetric History OB History  Gravida Para Term Preterm AB Living  0 0 0 0 0 0  SAB TAB Ectopic Multiple Live Births  0 0 0 0 0    Past Medical History:  Diagnosis Date  . Chlamydia 07/2018   treated  . Gonorrhea 09/2018   treated  . Yeast vaginitis     Past Surgical History:  Procedure Laterality Date  . WISDOM TOOTH EXTRACTION  10/2016   all four;     Current Outpatient Medications on File Prior to Visit  Medication Sig Dispense Refill  . norethindrone-ethinyl estradiol (JUNEL FE 1/20) 1-20 MG-MCG tablet Take 1 tablet by mouth daily. as directed 3 Package 3  . ondansetron (ZOFRAN) 4 MG tablet Take 1 tablet (4 mg total) by mouth every 8 (eight) hours as needed for nausea or vomiting. 20 tablet 0   No current facility-administered medications on file prior to visit.     No Known Allergies  Social History   Socioeconomic History  . Marital status: Single    Spouse name: Not on file  . Number of children: 0  . Years of education: Not on file  . Highest education level: Some college, no degree  Occupational History  . Occupation: full time Ship broker   . Occupation: works part time     Comment: Therapist, art as a host   Social Needs  . Financial resource strain: Not hard at all  . Food insecurity    Worry: Never true    Inability: Never true  . Transportation needs    Medical: No    Non-medical: No  Tobacco Use  . Smoking status: Never  Smoker  . Smokeless tobacco: Never Used  Substance and Sexual Activity  . Alcohol use: Not Currently  . Drug use: No  . Sexual activity: Yes    Partners: Male    Birth control/protection: Pill, Condom  Lifestyle  . Physical activity    Days per week: 5 days    Minutes per session: 30 min  . Stress: Not at all  Relationships  . Social connections    Talks on phone: More than three times a week    Gets together: More than three times a week    Attends religious service: 1 to 4 times per year    Active member of club or organization: Yes    Attends meetings of clubs or organizations: More than 4 times per year    Relationship status: Never married  . Intimate partner violence    Fear of current or ex partner: No    Emotionally abused: No    Physically abused: No    Forced sexual activity: No  Other Topics Concern  . Not on file  Social History Narrative   She is going [employer] and is getting a degree in Designer, fashion/clothing     Family History  Problem Relation Age of Onset  . Hypertension Mother   . Healthy Father   .  Cervical cancer Maternal Grandmother   . Ovarian cancer Maternal Grandmother   . Breast cancer Neg Hx   . Colon cancer Neg Hx     The following portions of the patient's history were reviewed and updated as appropriate: allergies, current medications, past family history, past medical history, past social history, past surgical history and problem list.  Review of Systems  ROS negative except as noted above. Information obtained from patient.   Objective:   BP (!) 96/57   Pulse 84   Ht 4\' 11"  (1.499 m)   Wt 119 lb 3.2 oz (54.1 kg)   LMP 05/19/2019 (Exact Date)   BMI 24.08 kg/m    CONSTITUTIONAL: Well-developed, well-nourished female in no acute distress.   PELVIC:  External Genitalia: Normal  Vagina: Normal, swab collected  MUSCULOSKELETAL: Normal range of motion. No tenderness.  No cyanosis, clubbing, or edema.  Assessment:   1.  Vaginal discharge  - Cervicovaginal ancillary only  2. Vaginal irritation  - Cervicovaginal ancillary only  3. Screening for STD (sexually transmitted disease)  - Cervicovaginal ancillary only - RPR - HIV Antibody (routine testing w rflx) - HSV 1 AND 2 IGM ABS, INDIRECT - HSV(herpes simplex vrs) 1+2 ab-IgG   Plan:   Labs: see orders, will contact patient with results  Reviewed red flag symptoms and when to call  RTC as needed   Diona Fanti, CNM [facility], [facility] 06/03/19 2:56 PM

## 2019-06-03 NOTE — Progress Notes (Signed)
Patient c/o thin white vaginal discharge and vaginal irritation x1 week, no itching, no odor.

## 2019-06-05 LAB — HSV(HERPES SIMPLEX VRS) I + II AB-IGG
HSV 1 Glycoprotein G Ab, IgG: 51 index — ABNORMAL HIGH (ref 0.00–0.90)
HSV 2 IgG, Type Spec: <0.91 index (ref 0.00–0.90)

## 2019-06-05 LAB — HIV ANTIBODY (ROUTINE TESTING W REFLEX): HIV Screen 4th Generation wRfx: NONREACTIVE

## 2019-06-05 LAB — HSV 1 AND 2 IGM ABS, INDIRECT
HSV 1 IgM: <1:10 {titer}
HSV 2 IgM: <1:10 {titer}

## 2019-06-05 LAB — RPR: RPR Ser Ql: NONREACTIVE

## 2019-06-07 LAB — CERVICOVAGINAL ANCILLARY ONLY
Bacterial vaginitis: NEGATIVE
Candida vaginitis: NEGATIVE
Chlamydia: NEGATIVE
Neisseria Gonorrhea: NEGATIVE
Trichomonas: NEGATIVE

## 2019-06-28 ENCOUNTER — Ambulatory Visit: Payer: Self-pay | Admitting: Family Medicine

## 2019-06-28 NOTE — Telephone Encounter (Signed)
Made appt

## 2019-06-28 NOTE — Telephone Encounter (Signed)
Pt reports burning type chest pain, onset 1 week ago. Rates a 6-7/10, states only occurs at HS when lying down. Reports at times she wakes up during night and "Heart is racing." States resolved throughout day, no CP presently. Also reports SOB with exertion and mild lightheadedness in mornings only. No symptoms presently. HAs not taken anything OTC for symptoms. Call transferred to practice, Suanne Marker, for consideration of appt. Pts email and PH# verified.  Reason for Disposition . [1] Chest pain lasts > 5 minutes AND [2] occurred > 3 days ago (72 hours) AND [3] NO chest pain or cardiac symptoms now  Answer Assessment - Initial Assessment Questions 1. LOCATION: "Where does it hurt?"       Mid chest right below breasts 2. RADIATION: "Does the pain go anywhere else?" (e.g., into neck, jaw, arms, back)     no 3. ONSET: "When did the chest pain begin?" (Minutes, hours or days)      1 week ago 4. PATTERN "Does the pain come and go, or has it been constant since it started?"  "Does it get worse with exertion?"     Mostly at HS when lying down 5. DURATION: "How long does it last" (e.g., seconds, minutes, hours)     Duration "Most of night" Heart pounds also 6. SEVERITY: "How bad is the pain?"  (e.g., Scale 1-10; mild, moderate, or severe)    - MILD (1-3): doesn't interfere with normal activities     - MODERATE (4-7): interferes with normal activities or awakens from sleep    - SEVERE (8-10): excruciating pain, unable to do any normal activities      Burning pain, 6-7/10 7. CARDIAC RISK FACTORS: "Do you have any history of heart problems or risk factors for heart disease?" (e.g., angina, prior heart attack; diabetes, high blood pressure, high cholesterol, smoker, or strong family history of heart disease)    no 8. PULMONARY RISK FACTORS: "Do you have any history of lung disease?"  (e.g., blood clots in lung, asthma, emphysema, birth control pills)     no 9. CAUSE: "What do you think is causing the  chest pain?"     unsure 10. OTHER SYMPTOMS: "Do you have any other symptoms?" (e.g., dizziness, nausea, vomiting, sweating, fever, difficulty breathing, cough)      SOB in AM, with exertion only. Resolves throughout day.Lightheadedness when first gets OOB 11. PREGNANCY: "Is thenoe any chance you are pregnant?" "When was your last menstrual period?" NO  Protocols used: CHEST PAIN-A-AH

## 2019-06-30 ENCOUNTER — Ambulatory Visit: Payer: Federal, State, Local not specified - PPO | Admitting: Nurse Practitioner

## 2019-06-30 ENCOUNTER — Encounter: Payer: Self-pay | Admitting: Nurse Practitioner

## 2019-07-06 DIAGNOSIS — K029 Dental caries, unspecified: Secondary | ICD-10-CM | POA: Diagnosis not present

## 2019-07-06 DIAGNOSIS — K0500 Acute gingivitis, plaque induced: Secondary | ICD-10-CM | POA: Diagnosis not present

## 2019-07-06 DIAGNOSIS — Z1281 Encounter for screening for malignant neoplasm of oral cavity: Secondary | ICD-10-CM | POA: Diagnosis not present

## 2019-07-27 ENCOUNTER — Encounter (HOSPITAL_COMMUNITY): Payer: Self-pay | Admitting: Urgent Care

## 2019-07-27 ENCOUNTER — Ambulatory Visit (HOSPITAL_COMMUNITY)
Admission: EM | Admit: 2019-07-27 | Discharge: 2019-07-27 | Disposition: A | Discharge status: Home / Self Care | Payer: Federal, State, Local not specified - PPO | Attending: Urgent Care | Admitting: Urgent Care

## 2019-07-27 ENCOUNTER — Other Ambulatory Visit: Payer: Self-pay

## 2019-07-27 DIAGNOSIS — R1013 Epigastric pain: Secondary | ICD-10-CM

## 2019-07-27 LAB — POCT PREGNANCY, URINE: Preg Test, Ur: NEGATIVE

## 2019-07-27 MED ORDER — FAMOTIDINE 20 MG PO TABS: 20.0000 mg | ORAL_TABLET | Freq: Two times a day (BID) | ORAL | 0 refills | Status: DC

## 2019-07-27 MED ORDER — ESOMEPRAZOLE MAGNESIUM 20 MG PO CPDR: 20.0000 mg | DELAYED_RELEASE_CAPSULE | Freq: Every day | ORAL | 0 refills | Status: DC

## 2019-07-27 NOTE — ED Provider Notes (Signed)
MRN: [id number] DOB: Apr 06, 1996  Subjective:   Samantha Ramsey is a 23 y.o. female presenting for 3 to 4-week history of progressively worsening intermittent but now constant mid upper abdomen pain that radiates up to the lower part of her sternum.  Symptoms initially started after she ate food, states that she cannot remember what kinds of food she was eating when her pain was aggravated.  In this past week she has tried to eat more fruits and vegetables and less fried foods.  Has tried Nexium on 2 separate occasions.  No known COVID 19 contacts.  Patient reports bowel movements almost every day, has intermittent mild discomfort when defecating but no significant constipation.  Denies history of gallstones but was told by her grandmother that she needs to get checked for this.  LMP was 3 weeks ago, was regular.  Patient is sexually active, uses birth control pills.   No current facility-administered medications for this encounter.   Current Outpatient Medications:  .  norethindrone-ethinyl estradiol (JUNEL FE 1/20) 1-20 MG-MCG tablet, Take 1 tablet by mouth daily. as directed, Disp: 3 Package, Rfl: 3 .  ondansetron (ZOFRAN) 4 MG tablet, Take 1 tablet (4 mg total) by mouth every 8 (eight) hours as needed for nausea or vomiting., Disp: 20 tablet, Rfl: 0    No Known Allergies   Past Medical History:  Diagnosis Date  . Chlamydia 07/2018   treated  . Gonorrhea 09/2018   treated  . History of gonorrhea 12/15/2018  . Yeast vaginitis      Past Surgical History:  Procedure Laterality Date  . WISDOM TOOTH EXTRACTION  10/2016   all four;     Review of Systems  Constitutional: Negative for fever and malaise/fatigue.  HENT: Negative for congestion, ear pain, sinus pain and sore throat.   Eyes: Negative for blurred vision, double vision, discharge and redness.  Respiratory: Negative for cough, hemoptysis, shortness of breath and wheezing.   Cardiovascular: Negative for chest pain.   Gastrointestinal: Positive for abdominal pain. Negative for blood in stool, constipation, diarrhea, nausea and vomiting.  Genitourinary: Negative for dysuria, flank pain and hematuria.  Musculoskeletal: Negative for myalgias.  Skin: Negative for rash.  Neurological: Negative for dizziness, weakness and headaches.  Psychiatric/Behavioral: Negative for depression and substance abuse.   Social History   Tobacco Use  . Smoking status: Never Smoker  . Smokeless tobacco: Never Used  Substance Use Topics  . Alcohol use: Not Currently  . Drug use: No     Objective:   Vitals: BP 106/81   Pulse 82   Temp 97.8 F (36.6 C) (Oral)   Resp 16   LMP 07/12/2019   SpO2 100%   Physical Exam Constitutional:      General: She is not in acute distress.    Appearance: Normal appearance. She is well-developed and normal weight. She is not ill-appearing, toxic-appearing or diaphoretic.  HENT:     Head: Normocephalic and atraumatic.     Right Ear: External ear normal.     Left Ear: External ear normal.     Nose: Nose normal.     Mouth/Throat:     Mouth: Mucous membranes are moist.     Pharynx: Oropharynx is clear.  Eyes:     General: No scleral icterus.    Extraocular Movements: Extraocular movements intact.     Pupils: Pupils are equal, round, and reactive to light.  Cardiovascular:     Rate and Rhythm: Normal rate and regular rhythm.  Pulses: Normal pulses.     Heart sounds: Normal heart sounds. No murmur. No friction rub. No gallop.   Pulmonary:     Effort: Pulmonary effort is normal. No respiratory distress.     Breath sounds: Normal breath sounds. No stridor. No wheezing, rhonchi or rales.  Abdominal:     General: Bowel sounds are normal. There is no distension.     Palpations: Abdomen is soft. There is no mass.     Tenderness: There is abdominal tenderness (Upper abdomen, worse over epigastric region). There is no right CVA tenderness, left CVA tenderness, guarding or rebound.   Skin:    General: Skin is warm and dry.     Coloration: Skin is not pale.     Findings: No rash.  Neurological:     General: No focal deficit present.     Mental Status: She is alert and oriented to person, place, and time.  Psychiatric:        Mood and Affect: Mood normal.        Behavior: Behavior normal.        Thought Content: Thought content normal.        Judgment: Judgment normal.     Results for orders placed or performed during the hospital encounter of 07/27/19 (from the past 24 hour(s))  Pregnancy, urine POC     Status: None   Collection Time: 07/27/19  1:25 PM  Result Value Ref Range   Preg Test, Ur NEGATIVE NEGATIVE     Assessment and Plan :   1. Abdominal pain, epigastric    Counseled patient on need for imaging/ultrasound to rule out gallstones.  She will pursue this through establishing care with a PCP/[facility]. In the meantime, will have patient start combination of Nexium and Pepcid.  Recommended dietary modifications. Counseled patient on potential for adverse effects with medications prescribed/recommended today, ER and return-to-clinic precautions discussed, patient verbalized understanding.    Jaynee Eagles, PA-C 07/27/19 1331

## 2019-07-27 NOTE — Discharge Instructions (Signed)
You can cut back to taking famotidine once daily after 2 weeks. In 4 weeks, you can stop taking famotidine all together. Make sure you contact [facility] to establish care with a PCP for follow up, possible referral to a GI doctor for a consult.

## 2019-07-27 NOTE — ED Triage Notes (Signed)
PT initially signed in for chest pain, but points to epigastric area to designate where pain is. PT reports initially pain was worse in the morning and after eating, but now it is constant. PT took an "indigestion med" that helped short term.

## 2019-08-02 ENCOUNTER — Other Ambulatory Visit: Payer: Self-pay

## 2019-08-02 DIAGNOSIS — R6889 Other general symptoms and signs: Secondary | ICD-10-CM | POA: Diagnosis not present

## 2019-08-02 DIAGNOSIS — Z20822 Contact with and (suspected) exposure to covid-19: Secondary | ICD-10-CM

## 2019-08-03 LAB — NOVEL CORONAVIRUS, NAA: SARS-CoV-2, NAA: NOT DETECTED

## 2019-09-02 ENCOUNTER — Telehealth: Payer: Self-pay | Admitting: Certified Nurse Midwife

## 2019-09-02 ENCOUNTER — Telehealth: Payer: Self-pay | Admitting: Obstetrics and Gynecology

## 2019-09-02 NOTE — Telephone Encounter (Signed)
The patient called and stated she is wanting to know if she is able to just have a prescription sent to her pharmacy instead of coming into the office. Please advise.

## 2019-09-02 NOTE — Telephone Encounter (Signed)
Called and spoke with patient.  Patient c/o "I know I have a yeast infection can you send something in for me".  Patient advised she needs schedule an appointment to make sure it is a yeast infection and nothing else.  Patient aware she may use Monistat 7 day treatment and if it a YI this will treat it.  Patient verbalized understanding and has appointment schedule for 09/07/2019.

## 2019-09-07 ENCOUNTER — Ambulatory Visit (INDEPENDENT_AMBULATORY_CARE_PROVIDER_SITE_OTHER): Payer: Federal, State, Local not specified - PPO | Admitting: Obstetrics and Gynecology

## 2019-09-07 ENCOUNTER — Encounter: Payer: Self-pay | Admitting: Obstetrics and Gynecology

## 2019-09-07 ENCOUNTER — Other Ambulatory Visit: Payer: Self-pay

## 2019-09-07 ENCOUNTER — Other Ambulatory Visit (HOSPITAL_COMMUNITY)
Admission: RE | Admit: 2019-09-07 | Discharge: 2019-09-07 | Disposition: A | Discharge status: Home / Self Care | Payer: Federal, State, Local not specified - PPO | Source: Ambulatory Visit | Attending: Obstetrics and Gynecology | Admitting: Obstetrics and Gynecology

## 2019-09-07 VITALS — BP 119/82 | HR 92 | Ht 59.0 in | Wt 125.8 lb

## 2019-09-07 DIAGNOSIS — N898 Other specified noninflammatory disorders of vagina: Secondary | ICD-10-CM | POA: Insufficient documentation

## 2019-09-07 DIAGNOSIS — Z113 Encounter for screening for infections with a predominantly sexual mode of transmission: Secondary | ICD-10-CM | POA: Diagnosis not present

## 2019-09-07 NOTE — Patient Instructions (Signed)
Vaginitis Vaginitis is a condition in which the vaginal tissue swells and becomes red (inflamed). This condition is most often caused by a change in the normal balance of bacteria and yeast that live in the vagina. This change causes an overgrowth of certain bacteria or yeast, which causes the inflammation. There are different types of vaginitis, but the most common types are:  Bacterial vaginosis.  Yeast infection (candidiasis).  Trichomoniasis vaginitis. This is a sexually transmitted disease (STD).  Viral vaginitis.  Atrophic vaginitis.  Allergic vaginitis. What are the causes? The cause of this condition depends on the type of vaginitis. It can be caused by:  Bacteria (bacterial vaginosis).  Yeast, which is a fungus (yeast infection).  A parasite (trichomoniasis vaginitis).  A virus (viral vaginitis).  Low hormone levels (atrophic vaginitis). Low hormone levels can occur during pregnancy, breastfeeding, or after menopause.  Irritants, such as bubble baths, scented tampons, and feminine sprays (allergic vaginitis). Other factors can change the normal balance of the yeast and bacteria that live in the vagina. These include:  Antibiotic medicines.  Poor hygiene.  Diaphragms, vaginal sponges, spermicides, birth control pills, and intrauterine devices (IUD).  Sex.  Infection.  Uncontrolled diabetes.  A weakened defense (immune) system. What increases the risk? This condition is more likely to develop in women who:  Smoke.  Use vaginal douches, scented tampons, or scented sanitary pads.  Wear tight-fitting pants.  Wear thong underwear.  Use oral birth control pills or an IUD.  Have sex without a condom.  Have multiple sex partners.  Have an STD.  Frequently use the spermicide nonoxynol-9.  Eat lots of foods high in sugar.  Have uncontrolled diabetes.  Have low estrogen levels.  Have a weakened immune system from an immune disorder or medical  treatment.  Are pregnant or breastfeeding. What are the signs or symptoms? Symptoms vary depending on the cause of the vaginitis. Common symptoms include:  Abnormal vaginal discharge. ? The discharge is white, gray, or yellow with bacterial vaginosis. ? The discharge is thick, white, and cheesy with a yeast infection. ? The discharge is frothy and yellow or greenish with trichomoniasis.  A bad vaginal smell. The smell is fishy with bacterial vaginosis.  Vaginal itching, pain, or swelling.  Sex that is painful.  Pain or burning when urinating. Sometimes there are no symptoms. How is this diagnosed? This condition is diagnosed based on your symptoms and medical history. A physical exam, including a pelvic exam, will also be done. You may also have other tests, including:  Tests to determine the pH level (acidity or alkalinity) of your vagina.  A whiff test, to assess the odor that results when a sample of your vaginal discharge is mixed with a potassium hydroxide solution.  Tests of vaginal fluid. A sample will be examined under a microscope. How is this treated? Treatment varies depending on the type of vaginitis you have. Your treatment may include:  Antibiotic creams or pills to treat bacterial vaginosis and trichomoniasis.  Antifungal medicines, such as vaginal creams or suppositories, to treat a yeast infection.  Medicine to ease discomfort if you have viral vaginitis. Your sexual partner should also be treated.  Estrogen delivered in a cream, pill, suppository, or vaginal ring to treat atrophic vaginitis. If vaginal dryness occurs, lubricants and moisturizing creams may help. You may need to avoid scented soaps, sprays, or douches.  Stopping use of a product that is causing allergic vaginitis. Then using a vaginal cream to treat the symptoms. Follow   these instructions at home: Lifestyle  Keep your genital area clean and dry. Avoid soap, and only rinse the area with  water.  Do not douche or use tampons until your health care provider says it is okay to do so. Use sanitary pads, if needed.  Do not have sex until your health care provider approves. When you can return to sex, practice safe sex and use condoms.  Wipe from front to back. This avoids the spread of bacteria from the rectum to the vagina. General instructions  Take over-the-counter and prescription medicines only as told by your health care provider.  If you were prescribed an antibiotic medicine, take or use it as told by your health care provider. Do not stop taking or using the antibiotic even if you start to feel better.  Keep all follow-up visits as told by your health care provider. This is important. How is this prevented?  Use mild, non-scented products. Do not use things that can irritate the vagina, such as fabric softeners. Avoid the following products if they are scented: ? Feminine sprays. ? Detergents. ? Tampons. ? Feminine hygiene products. ? Soaps or bubble baths.  Let air reach your genital area. ? Wear cotton underwear to reduce moisture buildup. ? Avoid wearing underwear while you sleep. ? Avoid wearing tight pants and underwear or nylons without a cotton panel. ? Avoid wearing thong underwear.  Take off any wet clothing, such as bathing suits, as soon as possible.  Practice safe sex and use condoms. Contact a health care provider if:  You have abdominal pain.  You have a fever.  You have symptoms that last for more than 2-3 days. Get help right away if:  You have a fever and your symptoms suddenly get worse. Summary  Vaginitis is a condition in which the vaginal tissue becomes inflamed.This condition is most often caused by a change in the normal balance of bacteria and yeast that live in the vagina.  Treatment varies depending on the type of vaginitis you have.  Do not douche, use tampons , or have sex until your health care provider approves. When  you can return to sex, practice safe sex and use condoms. This information is not intended to replace advice given to you by your health care provider. Make sure you discuss any questions you have with your health care provider. Document Released: 09/01/2007 Document Revised: 10/17/2017 Document Reviewed: 12/10/2016 Elsevier Patient Education  2020 [employer]  

## 2019-09-07 NOTE — Progress Notes (Signed)
    GYNECOLOGY PROGRESS NOTE  Subjective:    Patient ID: Samantha Ramsey, female    DOB: 1995/12/04, 23 y.o.   MRN: [id number]  HPI  Patient is a 23 y.o. G0P0000 female who presents for STD testing. She complains of white vaginal discharge, itching and burning x 1 week. Has not tried anything for relief. Patient reports that she is sexually active, 1 partner. Currently on OCPs for contraception. Reports menstrual cycle started today.   The following portions of the patient's history were reviewed and updated as appropriate: allergies, current medications, past family history, past medical history, past social history, past surgical history and problem list.  Review of Systems Pertinent items noted in HPI and remainder of comprehensive ROS otherwise negative.   Objective:   Blood pressure 119/82, pulse 92, height 4\' 11"  (1.499 m), weight 125 lb 12.8 oz (57.1 kg), last menstrual period 09/07/2019. General appearance: alert and no distress Abdomen: soft, non-tender; bowel sounds normal; no masses,  no organomegaly Pelvic: external genitalia normal, rectovaginal septum normal.  Vagina with no visible discharge, small amount of dark red blood in vaginal vault.  Cervix normal appearing, no lesions and no motion tenderness.  Uterus mobile, nontender, normal shape and size.  Adnexae non-palpable, nontender bilaterally.    Assessment:   1. Vaginal discharge  Plan:   1. No evidence of vaginitis noted on today's exam, no discharge noted or else possibly obscured by blood. Will perform vaginal cultures and also screen for STDs at patient's request. Counseled on safe sex practices. Can use vaginal Tea-Tree oil or coconut oil suppositories as needed. Return to clinic for any scheduled appointments or for any gynecologic concerns as needed. Will notify patient of results by phone.     Rubie Maid, MD [facility]

## 2019-09-07 NOTE — Progress Notes (Signed)
Patient here for STD testing, c/o thick white vaginal discharge, vaginal itching and vaginal burning x1 week.

## 2019-09-13 ENCOUNTER — Telehealth: Payer: Self-pay | Admitting: Obstetrics and Gynecology

## 2019-09-13 NOTE — Telephone Encounter (Signed)
The patient called to check the status of her test results. Pt is requesting a cll back if possible. Please advise.

## 2019-09-15 LAB — CERVICOVAGINAL ANCILLARY ONLY
Bacterial Vaginitis (gardnerella): POSITIVE — AB
Candida Glabrata: NEGATIVE
Candida Vaginitis: POSITIVE — AB
Chlamydia: NEGATIVE
Comment: NEGATIVE
Comment: NEGATIVE
Comment: NEGATIVE
Comment: NEGATIVE
Comment: NEGATIVE
Comment: NORMAL
Neisseria Gonorrhea: NEGATIVE
Trichomonas: NEGATIVE

## 2019-09-15 NOTE — Telephone Encounter (Signed)
Pt called requesting test results. Please return call to pt.

## 2019-09-16 ENCOUNTER — Telehealth: Payer: Self-pay

## 2019-09-16 MED ORDER — FLUCONAZOLE 150 MG PO TABS: 150.0000 mg | ORAL_TABLET | Freq: Once | ORAL | 0 refills | Status: AC

## 2019-09-16 MED ORDER — METRONIDAZOLE 500 MG PO TABS: 500.0000 mg | ORAL_TABLET | Freq: Two times a day (BID) | ORAL | 0 refills | Status: DC

## 2019-09-16 NOTE — Telephone Encounter (Signed)
Please call in BV and Yeast meds to [facility] in [place]. Per pt request.

## 2019-09-16 NOTE — Telephone Encounter (Signed)
Pt called no answer unable to LM due to VM being full.

## 2019-09-17 NOTE — Telephone Encounter (Signed)
Please see another phone encounter.  

## 2019-10-05 ENCOUNTER — Encounter: Payer: Federal, State, Local not specified - PPO | Admitting: Obstetrics and Gynecology

## 2019-11-04 ENCOUNTER — Telehealth: Payer: Self-pay | Admitting: Obstetrics and Gynecology

## 2019-11-04 NOTE — Telephone Encounter (Signed)
Pt called in pt is experiencing an allergic reaction to her feminine hygiene products  ( KOTEX PADS) . Pt is requesting a call from the nurse. Please advise

## 2019-11-08 NOTE — Telephone Encounter (Signed)
Patient called in need of a authorization for birth control.

## 2019-11-10 ENCOUNTER — Telehealth: Payer: Self-pay | Admitting: Obstetrics and Gynecology

## 2019-11-10 DIAGNOSIS — Z3041 Encounter for surveillance of contraceptive pills: Secondary | ICD-10-CM

## 2019-11-10 NOTE — Telephone Encounter (Signed)
Pt is needs a refil on her birth control. Please advise

## 2019-11-11 MED ORDER — NORETHIN ACE-ETH ESTRAD-FE 1-20 MG-MCG PO TABS: 1.0000 | ORAL_TABLET | Freq: Every day | ORAL | 3 refills | Status: DC

## 2019-11-11 NOTE — Telephone Encounter (Signed)
Pt is aware that her medication has been refilled.

## 2019-11-16 ENCOUNTER — Telehealth: Payer: Self-pay

## 2019-11-16 NOTE — Telephone Encounter (Signed)
Patient is experiencing vaginal itching and burning . Would like to be seen on Thurs or Fri. I notified the patient that our office is closed on New Years an Dr. Marcelline Mates is off on Thurs. Please call patient. She'd like to drop off a urine sample.

## 2019-11-17 NOTE — Telephone Encounter (Signed)
Pt called. Pt is aware that her birth control has been refilled. Pt was asking about getting STD check. Pt wanted to come in before her scheduled annual exam. Pt stated having fishy odor. Pt was advised that it was best if she came into the office to be seen. Pt stated that she would go to her school clinic and be checked.

## 2019-11-17 NOTE — Telephone Encounter (Signed)
Please see another phone encounter.  

## 2019-11-22 ENCOUNTER — Other Ambulatory Visit: Payer: Self-pay

## 2019-11-23 ENCOUNTER — Other Ambulatory Visit: Payer: Self-pay

## 2019-12-01 ENCOUNTER — Other Ambulatory Visit: Payer: Self-pay

## 2019-12-01 ENCOUNTER — Encounter: Payer: Self-pay | Admitting: Obstetrics and Gynecology

## 2019-12-01 ENCOUNTER — Other Ambulatory Visit (HOSPITAL_COMMUNITY)
Admission: RE | Admit: 2019-12-01 | Discharge: 2019-12-01 | Disposition: A | Discharge status: Home / Self Care | Payer: Federal, State, Local not specified - PPO | Source: Ambulatory Visit | Attending: Obstetrics and Gynecology | Admitting: Obstetrics and Gynecology

## 2019-12-01 ENCOUNTER — Ambulatory Visit (INDEPENDENT_AMBULATORY_CARE_PROVIDER_SITE_OTHER): Payer: Federal, State, Local not specified - PPO | Admitting: Obstetrics and Gynecology

## 2019-12-01 VITALS — BP 116/74 | HR 88 | Ht 59.0 in | Wt 129.6 lb

## 2019-12-01 DIAGNOSIS — E785 Hyperlipidemia, unspecified: Secondary | ICD-10-CM | POA: Diagnosis not present

## 2019-12-01 DIAGNOSIS — Z113 Encounter for screening for infections with a predominantly sexual mode of transmission: Secondary | ICD-10-CM

## 2019-12-01 DIAGNOSIS — N644 Mastodynia: Secondary | ICD-10-CM

## 2019-12-01 DIAGNOSIS — Z01419 Encounter for gynecological examination (general) (routine) without abnormal findings: Secondary | ICD-10-CM

## 2019-12-01 DIAGNOSIS — R110 Nausea: Secondary | ICD-10-CM

## 2019-12-01 LAB — POCT URINE PREGNANCY: Preg Test, Ur: NEGATIVE

## 2019-12-01 MED ORDER — ONDANSETRON HCL 4 MG PO TABS: 4.0000 mg | ORAL_TABLET | Freq: Three times a day (TID) | ORAL | 0 refills | Status: DC | PRN

## 2019-12-01 NOTE — Patient Instructions (Addendum)
Safe Sex Practicing safe sex means taking steps before and during sex to reduce your risk of:  Getting an STI (sexually transmitted infection).  Giving your partner an STI.  Unwanted or unplanned pregnancy. How can I practice safe sex?     Ways you can practice safe sex  Limit your sexual partners to only one partner who is having sex with only you.  Avoid using alcohol and drugs before having sex. Alcohol and drugs can affect your judgment.  Before having sex with a new partner: ? Talk to your partner about past partners, past STIs, and drug use. ? Get screened for STIs and discuss the results with your partner. Ask your partner to get screened, too.  Check your body regularly for sores, blisters, rashes, or unusual discharge. If you notice any of these problems, visit your health care provider.  Avoid sexual contact if you have symptoms of an infection or you are being treated for an STI.  While having sex, use a condom. Make sure to: ? Use a condom every time you have vaginal, oral, or anal sex. Both females and males should wear condoms during oral sex. ? Keep condoms in place from the beginning to the end of sexual activity. ? Use a latex condom, if possible. Latex condoms offer the best protection. ? Use only water-based lubricants with a condom. Using petroleum-based lubricants or oils will weaken the condom and increase the chance that it will break. Ways your health care provider can help you practice safe sex  See your health care provider for regular screenings, exams, and tests for STIs.  Talk with your health care provider about what kind of birth control (contraception) is best for you.  Get vaccinated against hepatitis B and human papillomavirus (HPV).  If you are at risk of being infected with HIV (human immunodeficiency virus), talk with your health care provider about taking a prescription medicine to prevent HIV infection. You are at risk for HIV if  you: ? Are a man who has sex with other men. ? Are sexually active with more than one partner. ? Take drugs by injection. ? Have a sex partner who has HIV. ? Have unprotected sex. ? Have sex with someone who has sex with both men and women. ? Have had an STI. Follow these instructions at home:  Take over-the-counter and prescription medicines as told by your health care provider.  Keep all follow-up visits as told by your health care provider. This is important. Where to find more information  Centers for Disease Control and Prevention: PinkCheek.be  Planned Parenthood: https://www.plannedparenthood.org/  Office on Women's Health: BasketballVoice.it Summary  Practicing safe sex means taking steps before and during sex to reduce your risk of STIs, giving your partner STIs, and having an unwanted or unplanned pregnancy.  Before having sex with a new partner, talk to your partner about past partners, past STIs, and drug use.  Use a condom every time you have vaginal, oral, or anal sex. Both females and males should wear condoms during oral sex.  Check your body regularly for sores, blisters, rashes, or unusual discharge. If you notice any of these problems, visit your health care provider.  See your health care provider for regular screenings, exams, and tests for STIs. This information is not intended to replace advice given to you by your health care provider. Make sure you discuss any questions you have with your health care provider. Document Revised: 02/26/2019 Document Reviewed: 08/17/2018 Elsevier Patient Education  [place] 22-22 Years Old, Female Preventive care refers to visits with your health care provider and lifestyle choices that can promote health and wellness. This includes:  A yearly physical exam. This may also be called an annual well  check.  Regular dental visits and eye exams.  Immunizations.  Screening for certain conditions.  Healthy lifestyle choices, such as eating a healthy diet, getting regular exercise, not using drugs or products that contain nicotine and tobacco, and limiting alcohol use. What can I expect for my preventive care visit? Physical exam Your health care provider will check your:  Height and weight. This may be used to calculate body mass index (BMI), which tells if you are at a healthy weight.  Heart rate and blood pressure.  Skin for abnormal spots. Counseling Your health care provider may ask you questions about your:  Alcohol, tobacco, and drug use.  Emotional well-being.  Home and relationship well-being.  Sexual activity.  Eating habits.  Work and work Statistician.  Method of birth control.  Menstrual cycle.  Pregnancy history. What immunizations do I need?  Influenza (flu) vaccine  This is recommended every year. Tetanus, diphtheria, and pertussis (Tdap) vaccine  You may need a Td booster every 10 years. Varicella (chickenpox) vaccine  You may need this if you have not been vaccinated. Human papillomavirus (HPV) vaccine  If recommended by your health care provider, you may need three doses over 6 months. Measles, mumps, and rubella (MMR) vaccine  You may need at least one dose of MMR. You may also need a second dose. Meningococcal conjugate (MenACWY) vaccine  One dose is recommended if you are age 46-21 years and a first-year college student living in a residence hall, or if you have one of several medical conditions. You may also need additional booster doses. Pneumococcal conjugate (PCV13) vaccine  You may need this if you have certain conditions and were not previously vaccinated. Pneumococcal polysaccharide (PPSV23) vaccine  You may need one or two doses if you smoke cigarettes or if you have certain conditions. Hepatitis A vaccine  You may need  this if you have certain conditions or if you travel or work in places where you may be exposed to hepatitis A. Hepatitis B vaccine  You may need this if you have certain conditions or if you travel or work in places where you may be exposed to hepatitis B. Haemophilus influenzae type b (Hib) vaccine  You may need this if you have certain conditions. You may receive vaccines as individual doses or as more than one vaccine together in one shot (combination vaccines). Talk with your health care provider about the risks and benefits of combination vaccines. What tests do I need?  Blood tests  Lipid and cholesterol levels. These may be checked every 5 years starting at age 52.  Hepatitis C test.  Hepatitis B test. Screening  Diabetes screening. This is done by checking your blood sugar (glucose) after you have not eaten for a while (fasting).  Sexually transmitted disease (STD) testing.  BRCA-related cancer screening. This may be done if you have a family history of breast, ovarian, tubal, or peritoneal cancers.  Pelvic exam and Pap test. This may be done every 3 years starting at age 58. Starting at age 6, this may be done every 5 years if you have a Pap test in combination with an HPV test. Talk with your health care provider about your test results, treatment options, and if necessary, the  need for more tests. Follow these instructions at home: Eating and drinking   Eat a diet that includes fresh fruits and vegetables, whole grains, lean protein, and low-fat dairy.  Take vitamin and mineral supplements as recommended by your health care provider.  Do not drink alcohol if: ? Your health care provider tells you not to drink. ? You are pregnant, may be pregnant, or are planning to become pregnant.  If you drink alcohol: ? Limit how much you have to 0-1 drink a day. ? Be aware of how much alcohol is in your drink. In the [place] one drink equals one 12 oz bottle of beer (355 mL),  one 5 oz glass of wine (148 mL), or one 1 oz glass of hard liquor (44 mL). Lifestyle  Take daily care of your teeth and gums.  Stay active. Exercise for at least 30 minutes on 5 or more days each week.  Do not use any products that contain nicotine or tobacco, such as cigarettes, e-cigarettes, and chewing tobacco. If you need help quitting, ask your health care provider.  If you are sexually active, practice safe sex. Use a condom or other form of birth control (contraception) in order to prevent pregnancy and STIs (sexually transmitted infections). If you plan to become pregnant, see your health care provider for a preconception visit. What's next?  Visit your health care provider once a year for a well check visit.  Ask your health care provider how often you should have your eyes and teeth checked.  Stay up to date on all vaccines. This information is not intended to replace advice given to you by your health care provider. Make sure you discuss any questions you have with your health care provider. Document Revised: 07/16/2018 Document Reviewed: 07/16/2018 Elsevier Patient Education  2020 [place] Breast self-awareness is knowing how your breasts look and feel. Doing breast self-awareness is important. It allows you to catch a breast problem early while it is still small and can be treated. All women should do breast self-awareness, including women who have had breast implants. Tell your doctor if you notice a change in your breasts. What you need:  A mirror.  A well-lit room. How to do a breast self-exam A breast self-exam is one way to learn what is normal for your breasts and to check for changes. To do a breast self-exam: Look for changes  1. Take off all the clothes above your waist. 2. Stand in front of a mirror in a room with good lighting. 3. Put your hands on your hips. 4. Push your hands down. 5. Look at your breasts and nipples in the  mirror to see if one breast or nipple looks different from the other. Check to see if: ? The shape of one breast is different. ? The size of one breast is different. ? There are wrinkles, dips, and bumps in one breast and not the other. 6. Look at each breast for changes in the skin, such as: ? Redness. ? Scaly areas. 7. Look for changes in your nipples, such as: ? Liquid around the nipples. ? Bleeding. ? Dimpling. ? Redness. ? A change in where the nipples are. Feel for changes  1. Lie on your back on the floor. 2. Feel each breast. To do this, follow these steps: ? Pick a breast to feel. ? Put the arm closest to that breast above your head. ? Use your other arm to feel the nipple area  of your breast. Feel the area with the pads of your three middle fingers by making small circles with your fingers. For the first circle, press lightly. For the second circle, press harder. For the third circle, press even harder. ? Keep making circles with your fingers at the different pressures as you move down your breast. Stop when you feel your ribs. ? Move your fingers a little toward the center of your body. ? Start making circles with your fingers again, this time going up until you reach your collarbone. ? Keep making up-and-down circles until you reach your armpit. Remember to keep using the three pressures. ? Feel the other breast in the same way. 3. Sit or stand in the tub or shower. 4. With soapy water on your skin, feel each breast the same way you did in step 2 when you were lying on the floor. Write down what you find Writing down what you find can help you remember what to tell your doctor. Write down:  What is normal for each breast.  Any changes you find in each breast, including: ? The kind of changes you find. ? Whether you have pain. ? Size and location of any lumps.  When you last had your menstrual period. General tips  Check your breasts every month.  If you are  breastfeeding, the best time to check your breasts is after you feed your baby or after you use a breast pump.  If you get menstrual periods, the best time to check your breasts is 5-7 days after your menstrual period is over.  With time, you will become comfortable with the self-exam, and you will begin to know if there are changes in your breasts. Contact a doctor if you:  See a change in the shape or size of your breasts or nipples.  See a change in the skin of your breast or nipples, such as red or scaly skin.  Have fluid coming from your nipples that is not normal.  Find a lump or thick area that was not there before.  Have pain in your breasts.  Have any concerns about your breast health. Summary  Breast self-awareness includes looking for changes in your breasts, as well as feeling for changes within your breasts.  Breast self-awareness should be done in front of a mirror in a well-lit room.  You should check your breasts every month. If you get menstrual periods, the best time to check your breasts is 5-7 days after your menstrual period is over.  Let your doctor know of any changes you see in your breasts, including changes in size, changes on the skin, pain or tenderness, or fluid from your nipples that is not normal. This information is not intended to replace advice given to you by your health care provider. Make sure you discuss any questions you have with your health care provider. Document Revised: 06/23/2018 Document Reviewed: 06/23/2018 Elsevier Patient Education  [place].    Breast Tenderness Breast tenderness is a common problem for women of all ages, but may also occur in men. Breast tenderness may range from mild discomfort to severe pain. In women, the pain usually comes and goes with the menstrual cycle, but it can also be constant. Breast tenderness has many possible causes, including hormone changes, infections, and taking certain medicines. You  may have tests, such as a mammogram or an ultrasound, to check for any unusual findings. Having breast tenderness usually does not mean that you have  breast cancer. Follow these instructions at home: Managing pain and discomfort   If directed, put ice to the painful area. To do this: ? Put ice in a plastic bag. ? Place a towel between your skin and the bag. ? Leave the ice on for 20 minutes, 2-3 times a day.  Wear a supportive bra, especially during exercise. You may also want to wear a supportive bra while sleeping if your breasts are very tender. Medicines  Take over-the-counter and prescription medicines only as told by your health care provider. If the cause of your pain is infection, you may be prescribed an antibiotic medicine.  If you were prescribed an antibiotic, take it as told by your health care provider. Do not stop taking the antibiotic even if you start to feel better. Eating and drinking  Your health care provider may recommend that you lessen the amount of fat in your diet. You can do this by: ? Limiting fried foods. ? Cooking foods using methods such as baking, boiling, grilling, and broiling.  Decrease the amount of caffeine in your diet. Instead, drink more water and choose caffeine-free drinks. General instructions   Keep a log of the days and times when your breasts are most tender.  Ask your health care provider how to do breast exams at home. This will help you notice if you have an unusual growth or lump.  Keep all follow-up visits as told by your health care provider. This is important. Contact a health care provider if:  Any part of your breast is hard, red, and hot to the touch. This may be a sign of infection.  You are a woman and: ? Not breastfeeding and you have fluid, especially blood or pus, coming out of your nipples. ? Have a new or painful lump in your breast that remains after your menstrual period ends.  You have a fever.  Your pain does  not improve or it gets worse.  Your pain is interfering with your daily activities. Summary  Breast tenderness may range from mild discomfort to severe pain.  Breast tenderness has many possible causes, including hormone changes, infections, and taking certain medicines.  It can be treated with ice, wearing a supportive bra, and medicines.  Make changes to your diet if told to by your health care provider. This information is not intended to replace advice given to you by your health care provider. Make sure you discuss any questions you have with your health care provider. Document Revised: 03/29/2019 Document Reviewed: 03/29/2019 Elsevier Patient Education  [place].

## 2019-12-01 NOTE — Progress Notes (Signed)
Pt is present for annual exam. Pt stated having vaginal burning and breast tenderness. Pt is currently taking Junel for birth control. Pt is unsure about her LMP. pt declined flu vaccine. Pt would like to have an entire STD panel check along with HIV done today.

## 2019-12-01 NOTE — Progress Notes (Signed)
GYNECOLOGY ANNUAL PHYSICAL EXAM PROGRESS NOTE  Subjective:    Samantha Ramsey is a 24 y.o. [place] female who presents for an annual exam.  The patient is sexually active. The patient wears seatbelts: yes. The patient participates in regular exercise: no. Has the patient ever been transfused or tattooed?: no. The patient reports that there is not domestic violence in her life.    The patient has the following complaints today. 1. Notes vaginal burning since Sunday. Does report clear or thin white discharge, no itching or  abnormal bleeding. She does have a history of recurrent yeast infections.  Desires STD screening, however does not think that she is at any risk for exposure. 2. Bilateral breast tenderness x 1 week, nothing makes it better or worse.     Gynecologic History Menarche age: 60 No LMP recorded (lmp unknown), sometime in December, can't remember exact date but expecting her cycle right around now.  Contraception: OCP (estrogen/progesterone) and intermittently uses condoms. Did miss one pill last month.  History of STI's: History of Chlamydia and Gonorrhea in 2019.  Last Pap: Patient has never had one.     OB History  Gravida Para Term Preterm AB Living  0 0 0 0 0 0  SAB TAB Ectopic Multiple Live Births  0 0 0 0 0    Past Medical History:  Diagnosis Date  . Chlamydia 07/2018   treated  . Gonorrhea 09/2018   treated  . History of gonorrhea 12/15/2018  . Yeast vaginitis     Past Surgical History:  Procedure Laterality Date  . WISDOM TOOTH EXTRACTION  10/2016   all four;     Family History  Problem Relation Age of Onset  . Hypertension Mother   . Healthy Father   . Cervical cancer Maternal Grandmother   . Ovarian cancer Maternal Grandmother   . Breast cancer Neg Hx   . Colon cancer Neg Hx     Social History   Socioeconomic History  . Marital status: Single    Spouse name: Not on file  . Number of children: 0  . Years of education: Not on file   . Highest education level: Some college, no degree  Occupational History  . Occupation: full time Ship broker   . Occupation: works part time     Comment: Therapist, art as a host   Tobacco Use  . Smoking status: Never Smoker  . Smokeless tobacco: Never Used  Substance and Sexual Activity  . Alcohol use: Yes    Alcohol/week: 1.0 standard drinks    Types: 1 Standard drinks or equivalent per week  . Drug use: No  . Sexual activity: Yes    Partners: Male    Birth control/protection: Pill, Condom  Other Topics Concern  . Not on file  Social History Narrative   She is going [employer] and is getting a degree in Designer, fashion/clothing    Social Determinants of Health   Financial Resource Strain: Low Risk   . Difficulty of Paying Living Expenses: Not hard at all  Food Insecurity: No Food Insecurity  . Worried About Charity fundraiser in the Last Year: Never true  . Ran Out of Food in the Last Year: Never true  Transportation Needs: No Transportation Needs  . Lack of Transportation (Medical): No  . Lack of Transportation (Non-Medical): No  Physical Activity: Sufficiently Active  . Days of Exercise per Week: 5 days  . Minutes of Exercise per Session: 30 min  Stress: No Stress Concern Present  . Feeling of Stress : Not at all  Social Connections: Slightly Isolated  . Frequency of Communication with Friends and Family: More than three times a week  . Frequency of Social Gatherings with Friends and Family: More than three times a week  . Attends Religious Services: 1 to 4 times per year  . Active Member of Clubs or Organizations: Yes  . Attends Archivist Meetings: More than 4 times per year  . Marital Status: Never married  Intimate Partner Violence: Not At Risk  . Fear of Current or Ex-Partner: No  . Emotionally Abused: No  . Physically Abused: No  . Sexually Abused: No    Current Outpatient Medications on File Prior to Visit  Medication Sig Dispense Refill  .  esomeprazole (NEXIUM) 20 MG capsule Take 1 capsule (20 mg total) by mouth daily before breakfast. 90 capsule 0  . famotidine (PEPCID) 20 MG tablet Take 1 tablet (20 mg total) by mouth 2 (two) times daily. 60 tablet 0  . norethindrone-ethinyl estradiol (JUNEL FE 1/20) 1-20 MG-MCG tablet Take 1 tablet by mouth daily. as directed 3 Package 3  . ondansetron (ZOFRAN) 4 MG tablet Take 1 tablet (4 mg total) by mouth every 8 (eight) hours as needed for nausea or vomiting. 20 tablet 0   No current facility-administered medications on file prior to visit.    No Known Allergies    Review of Systems Constitutional: negative for chills, fatigue, fevers and sweats Eyes: negative for irritation, redness and visual disturbance Ears, nose, mouth, throat, and face: negative for hearing loss, nasal congestion, snoring and tinnitus Respiratory: negative for asthma, cough, sputum Cardiovascular: negative for chest pain, dyspnea, exertional chest pressure/discomfort, irregular heart beat, palpitations and syncope Gastrointestinal: negative for abdominal pain, change in bowel habits, nausea and vomiting Genitourinary: negative for abnormal menstrual periods, genital lesions, sexual problems, dysuria and urinary incontinence.  Positive for vaginal discharge (see HPI) Integument/breast: negative for breast lump, and nipple discharge. Positive for breast tenderness bilaterally x 1 week.  Hematologic/lymphatic: negative for bleeding and easy bruising Musculoskeletal:negative for back pain and muscle weakness Neurological: negative for dizziness, headaches, vertigo and weakness Endocrine: negative for diabetic symptoms including polydipsia, polyuria and skin dryness Allergic/Immunologic: negative for hay fever and urticaria        Objective:  Blood pressure 116/74, pulse 88, height 4\' 11"  (1.499 m), weight 129 lb 9.6 oz (58.8 kg). Body mass index is 26.18 kg/m.   General Appearance:    Alert, cooperative, no  distress, appears stated age, overweight  Head:    Normocephalic, without obvious abnormality, atraumatic  Eyes:    PERRL, conjunctiva/corneas clear, EOM's intact, both eyes  Ears:    Normal external ear canals, both ears  Nose:   Nares normal, septum midline, mucosa normal, no drainage or sinus tenderness  Throat:   Lips, mucosa, and tongue normal; teeth and gums normal  Neck:   Supple, symmetrical, trachea midline, no adenopathy; thyroid: no enlargement/tenderness/nodules; no carotid bruit or JVD  Back:     Symmetric, no curvature, ROM normal, no CVA tenderness  Lungs:     Clear to auscultation bilaterally, respirations unlabored  Chest Wall:    No tenderness or deformity   Heart:    Regular rate and rhythm, S1 and S2 normal, no murmur, rub or gallop  Breast Exam:    Mild tenderness bilaterally, nodular region noted in upper left quadrant of left breast. No nipple abnormality  Abdomen:  Soft, non-tender, bowel sounds active all four quadrants, no masses, no organomegaly.    Genitalia:    Pelvic:external genitalia normal, vagina without lesions, or tenderness. Scant thin white discharge noted.   Rectovaginal septum  normal. Cervix normal in appearance, no cervical motion tenderness, no adnexal masses or tenderness.  Uterus normal size, shape, mobile, regular contours, nontender.  Rectal:    Normal external sphincter.  No hemorrhoids appreciated. Internal exam not done.   Extremities:   Extremities normal, atraumatic, no cyanosis or edema  Pulses:   2+ and symmetric all extremities  Skin:   Skin color, texture, turgor normal, no rashes or lesions  Lymph nodes:   Cervical, supraclavicular, and axillary nodes normal  Neurologic:   CNII-XII intact, normal strength, sensation and reflexes throughout   .  Labs:  Lab Results  Component Value Date   WBC 5.5 12/15/2018   HGB 14.0 12/15/2018   HCT 40.7 12/15/2018   MCV 90.4 12/15/2018   PLT 243 12/15/2018    Lab Results  Component Value  Date   CREATININE 0.79 12/15/2018   BUN 15 12/15/2018   NA 139 12/15/2018   K 3.8 12/15/2018   CL 107 12/15/2018   CO2 25 12/15/2018    Lab Results  Component Value Date   ALT 13 12/15/2018   AST 16 12/15/2018   ALKPHOS 54 02/12/2018   BILITOT 0.6 12/15/2018    No results found for: TSH   Assessment:   1. Encounter for well woman exam with routine gynecological exam   2. Screening examination for STD (sexually transmitted disease)   3. Dyslipidemia   4. Breast tenderness in female   5. Nausea     Plan:    - Blood tests: None ordered.  Labs up-to-date.. - Breast self exam technique reviewed and patient encouraged to perform self-exam monthly. - Contraception: OCP (estrogen/progesterone). - Discussed healthy lifestyle modifications. - Pap smear performed today.   - Nuswab and STD screening performed by his request, including HSV and HIV testing. - Dyslipidemia as noted on labs.  Patient young with no other risk factors, may be possible that mild abnormalities are due to current birth control.  We will continue to follow.  No treatment recommended at this time. - Patient desires refill on Zofran.  Uses for nausea associated with use of contraceptive pills during the first week.  - Breast tenderness, unknown cause.  Patient denies intake of caffeine.  UPT performed in light of breast tenderness and history of missing a pill dose, was negative today.  Advised on cool compresses, OTC pain medications as needed.  If symptoms do not abate over the next several weeks, can return for further evaluation.  On exam there was noted a nodular region in left upper quadrant breast, more consistent with fibrocystic breast changes. -Follow-up in 1 year for annual exam.    Rubie Maid, MD [facility]

## 2019-12-03 ENCOUNTER — Encounter: Payer: Self-pay | Admitting: Obstetrics and Gynecology

## 2019-12-03 DIAGNOSIS — B009 Herpesviral infection, unspecified: Secondary | ICD-10-CM | POA: Insufficient documentation

## 2019-12-03 LAB — COMPREHENSIVE METABOLIC PANEL
ALT: 14 IU/L (ref 0–32)
AST: 21 IU/L (ref 0–40)
Albumin/Globulin Ratio: 1.6 (ref 1.2–2.2)
Albumin: 4.3 g/dL (ref 3.9–5.0)
Alkaline Phosphatase: 54 IU/L (ref 39–117)
BUN/Creatinine Ratio: 20 (ref 9–23)
BUN: 14 mg/dL (ref 6–20)
Bilirubin Total: <0.2 mg/dL (ref 0.0–1.2)
CO2: 21 mmol/L (ref 20–29)
Calcium: 9 mg/dL (ref 8.7–10.2)
Chloride: 104 mmol/L (ref 96–106)
Creatinine, Ser: 0.71 mg/dL (ref 0.57–1.00)
GFR calc Af Amer: 141 mL/min/{1.73_m2} (ref >59)
GFR calc non Af Amer: 122 mL/min/{1.73_m2} (ref >59)
Globulin, Total: 2.7 g/dL (ref 1.5–4.5)
Glucose: 91 mg/dL (ref 65–99)
Potassium: 3.8 mmol/L (ref 3.5–5.2)
Sodium: 138 mmol/L (ref 134–144)
Total Protein: 7 g/dL (ref 6.0–8.5)

## 2019-12-03 LAB — LIPID PANEL
Chol/HDL Ratio: 3.2 ratio (ref 0.0–4.4)
Cholesterol, Total: 212 mg/dL — ABNORMAL HIGH (ref 100–199)
HDL: 67 mg/dL (ref >39)
LDL Chol Calc (NIH): 133 mg/dL — ABNORMAL HIGH (ref 0–99)
Triglycerides: 67 mg/dL (ref 0–149)
VLDL Cholesterol Cal: 12 mg/dL (ref 5–40)

## 2019-12-03 LAB — CBC
Hematocrit: 41.2 % (ref 34.0–46.6)
Hemoglobin: 13.7 g/dL (ref 11.1–15.9)
MCH: 30.7 pg (ref 26.6–33.0)
MCHC: 33.3 g/dL (ref 31.5–35.7)
MCV: 92 fL (ref 79–97)
Platelets: 209 10*3/uL (ref 150–450)
RBC: 4.46 x10E6/uL (ref 3.77–5.28)
RDW: 12.6 % (ref 11.7–15.4)
WBC: 6.2 10*3/uL (ref 3.4–10.8)

## 2019-12-03 LAB — HSV(HERPES SMPLX)ABS-I+II(IGG+IGM)-BLD
HSV 1 Glycoprotein G Ab, IgG: 46.5 index — ABNORMAL HIGH (ref 0.00–0.90)
HSV 2 IgG, Type Spec: <0.91 index (ref 0.00–0.90)
HSVI/II Comb IgM: <0.91 Ratio (ref 0.00–0.90)

## 2019-12-03 LAB — CYTOLOGY - PAP

## 2019-12-03 LAB — HIV ANTIBODY (ROUTINE TESTING W REFLEX): HIV Screen 4th Generation wRfx: NONREACTIVE

## 2019-12-03 LAB — RPR: RPR Ser Ql: NONREACTIVE

## 2019-12-05 LAB — NUSWAB VAGINITIS PLUS (VG+)
Candida albicans, NAA: NEGATIVE
Candida glabrata, NAA: NEGATIVE
Chlamydia trachomatis, NAA: NEGATIVE
Megasphaera 1: HIGH Score — AB
Neisseria gonorrhoeae, NAA: NEGATIVE
Trich vag by NAA: NEGATIVE

## 2019-12-08 DIAGNOSIS — R87612 Low grade squamous intraepithelial lesion on cytologic smear of cervix (LGSIL): Secondary | ICD-10-CM | POA: Insufficient documentation

## 2019-12-23 ENCOUNTER — Encounter: Payer: Federal, State, Local not specified - PPO | Admitting: Obstetrics and Gynecology

## 2020-02-24 ENCOUNTER — Other Ambulatory Visit: Payer: Self-pay

## 2020-02-24 ENCOUNTER — Ambulatory Visit: Payer: Federal, State, Local not specified - PPO | Admitting: Obstetrics and Gynecology

## 2020-02-24 ENCOUNTER — Encounter: Payer: Self-pay | Admitting: Obstetrics and Gynecology

## 2020-02-24 VITALS — BP 100/65 | HR 118 | Ht 59.0 in | Wt 134.3 lb

## 2020-02-24 DIAGNOSIS — N926 Irregular menstruation, unspecified: Secondary | ICD-10-CM

## 2020-02-24 DIAGNOSIS — N898 Other specified noninflammatory disorders of vagina: Secondary | ICD-10-CM | POA: Diagnosis not present

## 2020-02-24 DIAGNOSIS — Z3201 Encounter for pregnancy test, result positive: Secondary | ICD-10-CM | POA: Diagnosis not present

## 2020-02-24 LAB — POCT URINE PREGNANCY: Preg Test, Ur: POSITIVE — AB

## 2020-02-24 NOTE — Progress Notes (Signed)
Pt present for possible yeast infection, vaginal itching, burning and discharge. Pt also present for pregnancy confirmation. UPT-Positive.

## 2020-02-24 NOTE — Patient Instructions (Signed)
First Trimester of Pregnancy  The first trimester of pregnancy is from week 1 until the end of week 13 (months 1 through 3). During this time, your baby will begin to develop inside you. At 6-8 weeks, the eyes and face are formed, and the heartbeat can be seen on ultrasound. At the end of 12 weeks, all the baby's organs are formed. Prenatal care is all the medical care you receive before the birth of your baby. Make sure you get good prenatal care and follow all of your doctor's instructions. Follow these instructions at home: Medicines  Take over-the-counter and prescription medicines only as told by your doctor. Some medicines are safe and some medicines are not safe during pregnancy.  Take a prenatal vitamin that contains at least 600 micrograms (mcg) of folic acid.  If you have trouble pooping (constipation), take medicine that will make your stool soft (stool softener) if your doctor approves. Eating and drinking   Eat regular, healthy meals.  Your doctor will tell you the amount of weight gain that is right for you.  Avoid raw meat and uncooked cheese.  If you feel sick to your stomach (nauseous) or throw up (vomit): ? Eat 4 or 5 small meals a day instead of 3 large meals. ? Try eating a few soda crackers. ? Drink liquids between meals instead of during meals.  To prevent constipation: ? Eat foods that are high in fiber, like fresh fruits and vegetables, whole grains, and beans. ? Drink enough fluids to keep your pee (urine) clear or pale yellow. Activity  Exercise only as told by your doctor. Stop exercising if you have cramps or pain in your lower belly (abdomen) or low back.  Do not exercise if it is too hot, too humid, or if you are in a place of great height (high altitude).  Try to avoid standing for long periods of time. Move your legs often if you must stand in one place for a long time.  Avoid heavy lifting.  Wear low-heeled shoes. Sit and stand up straight.   You can have sex unless your doctor tells you not to. Relieving pain and discomfort  Wear a good support bra if your breasts are sore.  Take warm water baths (sitz baths) to soothe pain or discomfort caused by hemorrhoids. Use hemorrhoid cream if your doctor says it is okay.  Rest with your legs raised if you have leg cramps or low back pain.  If you have puffy, bulging veins (varicose veins) in your legs: ? Wear support hose or compression stockings as told by your doctor. ? Raise (elevate) your feet for 15 minutes, 3-4 times a day. ? Limit salt in your food. Prenatal care  Schedule your prenatal visits by the twelfth week of pregnancy.  Write down your questions. Take them to your prenatal visits.  Keep all your prenatal visits as told by your doctor. This is important. Safety  Wear your seat belt at all times when driving.  Make a list of emergency phone numbers. The list should include numbers for family, friends, the hospital, and police and fire departments. General instructions  Ask your doctor for a referral to a local prenatal class. Begin classes no later than at the start of month 6 of your pregnancy.  Ask for help if you need counseling or if you need help with nutrition. Your doctor can give you advice or tell you where to go for help.  Do not use hot tubs, steam  rooms, or saunas.  Do not douche or use tampons or scented sanitary pads.  Do not cross your legs for long periods of time.  Avoid all herbs and alcohol. Avoid drugs that are not approved by your doctor.  Do not use any tobacco products, including cigarettes, chewing tobacco, and electronic cigarettes. If you need help quitting, ask your doctor. You may get counseling or other support to help you quit.  Avoid cat litter boxes and soil used by cats. These carry germs that can cause birth defects in the baby and can cause a loss of your baby (miscarriage) or stillbirth.  Visit your dentist.  At home, brush your teeth with a soft toothbrush. Be gentle when you floss. Contact a doctor if:  You are dizzy.  You have mild cramps or pressure in your lower belly.  You have a nagging pain in your belly area.  You continue to feel sick to your stomach, you throw up, or you have watery poop (diarrhea).  You have a bad smelling fluid coming from your vagina.  You have pain when you pee (urinate).  You have increased puffiness (swelling) in your face, hands, legs, or ankles. Get help right away if:  You have a fever.  You are leaking fluid from your vagina.  You have spotting or bleeding from your vagina.  You have very bad belly cramping or pain.  You gain or lose weight rapidly.  You throw up blood. It may look like coffee grounds.  You are around people who have German measles, fifth disease, or chickenpox.  You have a very bad headache.  You have shortness of breath.  You have any kind of trauma, such as from a fall or a car accident. Summary  The first trimester of pregnancy is from week 1 until the end of week 13 (months 1 through 3).  To take care of yourself and your unborn baby, you will need to eat healthy meals, take medicines only if your doctor tells you to do so, and do activities that are safe for you and your baby.  Keep all follow-up visits as told by your doctor. This is important as your doctor will have to ensure that your baby is healthy and growing well. This information is not intended to replace advice given to you by your health care provider. Make sure you discuss any questions you have with your health care provider. Document Revised: 02/25/2019 Document Reviewed: 11/12/2016 Elsevier Patient Education  2020 Elsevier Inc. Common Medications Safe in Pregnancy  Acne:      Constipation:  Benzoyl Peroxide     Colace  Clindamycin      Dulcolax Suppository  Topica Erythromycin     Fibercon  Salicylic  Acid      Metamucil         Miralax AVOID:        Senakot   Accutane    Cough:  Retin-A       Cough Drops  Tetracycline      Phenergan w/ Codeine if Rx  Minocycline      Robitussin (Plain & DM)  Antibiotics:     Crabs/Lice:  Ceclor       RID  Cephalosporins    AVOID:  E-Mycins      Kwell  Keflex  Macrobid/Macrodantin   Diarrhea:  Penicillin      Kao-Pectate  Zithromax      Imodium AD         PUSH FLUIDS AVOID:         Cipro     Fever:  Tetracycline      Tylenol (Regular or Extra  Minocycline       Strength)  Levaquin      Extra Strength-Do not          Exceed 8 tabs/24 hrs Caffeine:        <229m/day (equiv. To 1 cup of coffee or  approx. 3 12 oz sodas)         Gas: Cold/Hayfever:       Gas-X  Benadryl      Mylicon  Claritin       Phazyme  **Claritin-D        Chlor-Trimeton    Headaches:  Dimetapp      ASA-Free Excedrin  Drixoral-Non-Drowsy     Cold Compress  Mucinex (Guaifenasin)     Tylenol (Regular or Extra  Sudafed/Sudafed-12 Hour     Strength)  **Sudafed PE Pseudoephedrine   Tylenol Cold & Sinus     Vicks Vapor Rub  Zyrtec  **AVOID if Problems With Blood Pressure         Heartburn: Avoid lying down for at least 1 hour after meals  Aciphex      Maalox     Rash:  Milk of Magnesia     Benadryl    Mylanta       1% Hydrocortisone Cream  Pepcid  Pepcid Complete   Sleep Aids:  Prevacid      Ambien   Prilosec       Benadryl  Rolaids       Chamomile Tea  Tums (Limit 4/day)     Unisom  Zantac       Tylenol PM         Warm milk-add vanilla or  Hemorrhoids:       Sugar for taste  Anusol/Anusol H.C.  (RX: Analapram 2.5%)  Sugar Substitutes:  Hydrocortisone OTC     Ok in moderation  Preparation H      Tucks        Vaseline lotion applied to tissue with wiping    Herpes:     Throat:  Acyclovir      Oragel  Famvir  Valtrex     Vaccines:         Flu Shot Leg Cramps:       *Gardasil  Benadryl      Hepatitis A         Hepatitis B Nasal Spray:       Pneumovax  Saline Nasal Spray     Polio Booster          Tetanus Nausea:       Tuberculosis test or PPD  Vitamin B6 25 mg TID   AVOID:    Dramamine      *Gardasil  Emetrol       Live Poliovirus  Ginger Root 250 mg QID    MMR (measles, mumps &  High Complex Carbs @ Bedtime    rebella)  Sea Bands-Accupressure    Varicella (Chickenpox)  Unisom 1/2 tab TID     *No known complications           If received before Pain:         Known pregnancy;   Darvocet       Resume series after  Lortab        Delivery  Percocet    Yeast:   Tramadol      Femstat  Tylenol 3  Gyne-lotrimin  Ultram       Monistat  Vicodin           MISC:         All Sunscreens           Hair Coloring/highlights          Insect Repellant's          (Including DEET)         Mystic Tans  

## 2020-02-24 NOTE — Progress Notes (Signed)
     GYNECOLOGY CLINIC PROGRESS NOTE Subjective:    Samantha Ramsey is a 24 y.o. [place] female who presents for complaints of vaginal discharge/vaginal itching. Notes symptoms started ~ 4-5 days ago.  She self-treated with OTC Monistat 1, reports that the discharge resolved, but is still noting vulvar itching/irritiation.   Secondarily, she also presents for evaluation of missed menses. She believes she could be pregnant. Pregnancy is desired. Pregnancy is unplanned.  Sexual Activity: single partner, contraception: none. Current symptoms also include: positive home pregnancy test. Last period was normal. Patient's last menstrual period was 01/23/2020.   The following portions of the patient's history were reviewed and updated as appropriate: allergies, current medications, past family history, past medical history, past social history, past surgical history and problem list.  Review of Systems Pertinent items noted in HPI and remainder of comprehensive ROS otherwise negative.     Objective:    BP 100/65   Pulse (!) 118   Ht 4\' 11"  (1.499 m)   Wt 134 lb 4.8 oz (60.9 kg)   LMP 01/23/2020   BMI 27.13 kg/m  General: no acute distress   Abdomen:   Soft, non-tender, no distention.   Pelvis:  external genitalia normal, rectovaginal septum normal.  Vagina with small amount of thin white discharge and mucus.  Cervix normal appearing, no lesions and no motion tenderness.  Uterus mobile, nontender, normal shape and size.  Adnexae non-palpable, nontender bilaterally.      Lab Review Urine HCG: positive    Assessment:   1. Vaginal irritation   2. Missed menses   3. Pregnancy test performed, pregnancy confirmed      Plan:   1. Vaginal irritation - patient recently self-treated with Monistat 1 OTC. Notes discharge has resolved but vaginal/vulvar irritation remains. Nuswab performed today, including STI testing.  Advised on symptomatic relief with coconut oil/Tea-tree oil, Vagisil, or  Monistat 7 cream.  2. Pregnancy Test: Positive: EDC: 10/29/2020, with EGA 4.4 weeks. Briefly discussed pre-natal care options.  Encouraged well-balanced diet, plenty of rest when needed, pre-natal vitamins daily and walking for exercise. Discussed self-help for nausea, avoiding OTC medications until consulting provider or pharmacist, other than Tylenol as needed, minimal caffeine (1-2 cups daily) and avoiding alcohol. She will schedule her NOB intake visit and dating scan in 3-4 weeks.  Feel free to call with any questions.    Rubie Maid, MD [facility]

## 2020-02-25 ENCOUNTER — Telehealth: Payer: Self-pay | Admitting: Obstetrics and Gynecology

## 2020-02-25 DIAGNOSIS — N898 Other specified noninflammatory disorders of vagina: Secondary | ICD-10-CM | POA: Diagnosis not present

## 2020-02-25 NOTE — Telephone Encounter (Signed)
Pt return call back to office. Spoke with pt and she stated that she was taking the blue and red Goli gummies and wanted to know if they were safe to taking while she was pregnancy. Pt was advised to not take the medication until I spoke to [facility] to make sure they were safe. Pt is aware that as soon as I heard something from [facility] I will call her back.

## 2020-02-25 NOTE — Telephone Encounter (Signed)
I would not recommend her taking these in pregnancy as they may contain certain herbal products that may affect a pregnancy (as well as limiting her weight gain which can be problematic in pregnancy). I gave her samples of vitamin options to try at her visit. If she is looking for other types of vitamins that are gluten free/vegan, I can recommend a brand for her.

## 2020-02-25 NOTE — Telephone Encounter (Signed)
Patient called in asking if she could take [place] while pregnant. Could you please advise?

## 2020-02-25 NOTE — Telephone Encounter (Signed)
Pt is wondering if it is okay to take the Goli gummies while pregnant she has the blue ones and the red ones. The blue ones pt stated was for calm, stress relief, weight control and memory and the red ones are for weight control. I informed that pt that I would speak to you first before given an answer. Pt is expecting a call back. Please advise. Thanks [employer]

## 2020-02-25 NOTE — Telephone Encounter (Signed)
Pt called no answer unable to LM via VM due to VM being full.

## 2020-02-28 LAB — NUSWAB VAGINITIS PLUS (VG+)
Candida albicans, NAA: NEGATIVE
Candida glabrata, NAA: NEGATIVE
Chlamydia trachomatis, NAA: NEGATIVE
Megasphaera 1: HIGH Score — AB
Neisseria gonorrhoeae, NAA: NEGATIVE
Trich vag by NAA: NEGATIVE

## 2020-02-28 MED ORDER — METRONIDAZOLE 500 MG PO TABS: 500.0000 mg | ORAL_TABLET | Freq: Two times a day (BID) | ORAL | 0 refills | Status: DC

## 2020-02-28 NOTE — Addendum Note (Signed)
Addended by: Augusto Gamble on: 02/28/2020 11:01 AM   Modules accepted: Orders

## 2020-03-01 ENCOUNTER — Other Ambulatory Visit: Payer: Self-pay

## 2020-03-01 DIAGNOSIS — Z1281 Encounter for screening for malignant neoplasm of oral cavity: Secondary | ICD-10-CM | POA: Diagnosis not present

## 2020-03-01 DIAGNOSIS — K0500 Acute gingivitis, plaque induced: Secondary | ICD-10-CM | POA: Diagnosis not present

## 2020-03-01 NOTE — Telephone Encounter (Signed)
error 

## 2020-03-08 ENCOUNTER — Telehealth: Payer: Self-pay | Admitting: Obstetrics and Gynecology

## 2020-03-08 NOTE — Telephone Encounter (Signed)
Patient states that medication for her BV is making her sick and dizzy. Could you please advise?

## 2020-03-09 NOTE — Telephone Encounter (Signed)
Spoke with concerning her call to the office. Pt stated that the last time she took the medication Flagyl she has an upset stomach, feeling nausea and dizziness. Pt was advised to stop taking the the medication and AC would be informed. Pt stated that she has been feel a little better.

## 2020-03-13 ENCOUNTER — Other Ambulatory Visit: Payer: Self-pay

## 2020-03-13 ENCOUNTER — Ambulatory Visit (INDEPENDENT_AMBULATORY_CARE_PROVIDER_SITE_OTHER): Payer: Federal, State, Local not specified - PPO | Admitting: Obstetrics and Gynecology

## 2020-03-13 VITALS — BP 101/65 | HR 80 | Ht 59.0 in | Wt 133.7 lb

## 2020-03-13 DIAGNOSIS — Z0283 Encounter for blood-alcohol and blood-drug test: Secondary | ICD-10-CM

## 2020-03-13 DIAGNOSIS — Z3401 Encounter for supervision of normal first pregnancy, first trimester: Secondary | ICD-10-CM | POA: Diagnosis not present

## 2020-03-13 DIAGNOSIS — O219 Vomiting of pregnancy, unspecified: Secondary | ICD-10-CM

## 2020-03-13 DIAGNOSIS — Z113 Encounter for screening for infections with a predominantly sexual mode of transmission: Secondary | ICD-10-CM

## 2020-03-13 MED ORDER — PROMETHAZINE HCL 25 MG PO TABS: 25.0000 mg | ORAL_TABLET | Freq: Four times a day (QID) | ORAL | 1 refills | Status: DC | PRN

## 2020-03-13 MED ORDER — DOXYLAMINE-PYRIDOXINE 10-10 MG PO TBEC: DELAYED_RELEASE_TABLET | ORAL | 0 refills | Status: DC

## 2020-03-13 NOTE — Progress Notes (Signed)
      Baker Pierini presents for NOB nurse intake visit. Pregnancy confirmation done at [facility], 02/24/2020 , with Rubie Maid, MD.  [place].  P0.  LMP3/05/2020.  EDD 10/29/2020  [place] [redacted]w[redacted]d. Pregnancy education material explained and given. 0 cats in the home.  NOB labs ordered. BMI less than 30. TSH/HbgA1c not ordered. Sickle cell order due to race. HIV and drug screen explained and ordered. Genetic screening discussed. Genetic testing; Unsure. Pt to discuss genetic testing with provider. PNV encouraged. Pt to follow up with provider in 4 weeks for NOB physical. FMLA form signed and completed. [facility] Financial Policy reviewed with pt.    BP 101/65   Pulse 80   Ht 4\' 11"  (1.499 m)   Wt 133 lb 11.2 oz (60.6 kg)   LMP 01/23/2020   BMI 27.00 kg/m

## 2020-03-13 NOTE — Patient Instructions (Signed)
WHAT OB PATIENTS CAN EXPECT   Confirmation of pregnancy and ultrasound ordered if medically indicated-[redacted] weeks gestation  New OB (NOB) intake with nurse and New OB (NOB) labs- [redacted] weeks gestation  New OB (NOB) physical examination with provider- 11/[redacted] weeks gestation  Flu vaccine-[redacted] weeks gestation  Anatomy scan-[redacted] weeks gestation  Glucose tolerance test, blood work to test for anemia, T-dap vaccine-[redacted] weeks gestation  Vaginal swabs/cultures-STD/Group B strep-[redacted] weeks gestation  Appointments every 4 weeks until 28 weeks  Every 2 weeks from 28 weeks until 36 weeks  Weekly visits from 36 weeks until delivery  Second Trimester of Pregnancy  The second trimester is from week 14 through week 27 (month 4 through 6). This is often the time in pregnancy that you feel your best. Often times, morning sickness has lessened or quit. You may have more energy, and you may get hungry more often. Your unborn baby is growing rapidly. At the end of the sixth month, he or she is about 9 inches long and weighs about 1 pounds. You will likely feel the baby move between 18 and 20 weeks of pregnancy. Follow these instructions at home: Medicines  Take over-the-counter and prescription medicines only as told by your doctor. Some medicines are safe and some medicines are not safe during pregnancy.  Take a prenatal vitamin that contains at least 600 micrograms (mcg) of folic acid.  If you have trouble pooping (constipation), take medicine that will make your stool soft (stool softener) if your doctor approves. Eating and drinking   Eat regular, healthy meals.  Avoid raw meat and uncooked cheese.  If you get low calcium from the food you eat, talk to your doctor about taking a daily calcium supplement.  Avoid foods that are high in fat and sugars, such as fried and sweet foods.  If you feel sick to your stomach (nauseous) or throw up (vomit): ? Eat 4 or 5 small meals a day instead of 3 large  meals. ? Try eating a few soda crackers. ? Drink liquids between meals instead of during meals.  To prevent constipation: ? Eat foods that are high in fiber, like fresh fruits and vegetables, whole grains, and beans. ? Drink enough fluids to keep your pee (urine) clear or pale yellow. Activity  Exercise only as told by your doctor. Stop exercising if you start to have cramps.  Do not exercise if it is too hot, too humid, or if you are in a place of great height (high altitude).  Avoid heavy lifting.  Wear low-heeled shoes. Sit and stand up straight.  You can continue to have sex unless your doctor tells you not to. Relieving pain and discomfort  Wear a good support bra if your breasts are tender.  Take warm water baths (sitz baths) to soothe pain or discomfort caused by hemorrhoids. Use hemorrhoid cream if your doctor approves.  Rest with your legs raised if you have leg cramps or low back pain.  If you develop puffy, bulging veins (varicose veins) in your legs: ? Wear support hose or compression stockings as told by your doctor. ? Raise (elevate) your feet for 15 minutes, 3-4 times a day. ? Limit salt in your food. Prenatal care  Write down your questions. Take them to your prenatal visits.  Keep all your prenatal visits as told by your doctor. This is important. Safety  Wear your seat belt when driving.  Make a list of emergency phone numbers, including numbers for family, friends, the  hospital, and police and fire departments. General instructions  Ask your doctor about the right foods to eat or for help finding a counselor, if you need these services.  Ask your doctor about local prenatal classes. Begin classes before month 6 of your pregnancy.  Do not use hot tubs, steam rooms, or saunas.  Do not douche or use tampons or scented sanitary pads.  Do not cross your legs for long periods of time.  Visit your dentist if you have not done so. Use a soft toothbrush  to brush your teeth. Floss gently.  Avoid all smoking, herbs, and alcohol. Avoid drugs that are not approved by your doctor.  Do not use any products that contain nicotine or tobacco, such as cigarettes and e-cigarettes. If you need help quitting, ask your doctor.  Avoid cat litter boxes and soil used by cats. These carry germs that can cause birth defects in the baby and can cause a loss of your baby (miscarriage) or stillbirth. Contact a doctor if:  You have mild cramps or pressure in your lower belly.  You have pain when you pee (urinate).  You have bad smelling fluid coming from your vagina.  You continue to feel sick to your stomach (nauseous), throw up (vomit), or have watery poop (diarrhea).  You have a nagging pain in your belly area.  You feel dizzy. Get help right away if:  You have a fever.  You are leaking fluid from your vagina.  You have spotting or bleeding from your vagina.  You have severe belly cramping or pain.  You lose or gain weight rapidly.  You have trouble catching your breath and have chest pain.  You notice sudden or extreme puffiness (swelling) of your face, hands, ankles, feet, or legs.  You have not felt the baby move in over an hour.  You have severe headaches that do not go away when you take medicine.  You have trouble seeing. Summary  The second trimester is from week 14 through week 27 (months 4 through 6). This is often the time in pregnancy that you feel your best.  To take care of yourself and your unborn baby, you will need to eat healthy meals, take medicines only if your doctor tells you to do so, and do activities that are safe for you and your baby.  Call your doctor if you get sick or if you notice anything unusual about your pregnancy. Also, call your doctor if you need help with the right food to eat, or if you want to know what activities are safe for you. This information is not intended to replace advice given to you by  your health care provider. Make sure you discuss any questions you have with your health care provider. Document Revised: 02/26/2019 Document Reviewed: 12/10/2016 Elsevier Patient Education  2020 [employer]. How a Baby Grows During Pregnancy  Pregnancy begins when a female's sperm enters a female's egg (fertilization). Fertilization usually happens in one of the tubes (fallopian tubes) that connect the ovaries to the womb (uterus). The fertilized egg moves down the fallopian tube to the uterus. Once it reaches the uterus, it implants into the lining of the uterus and begins to grow. For the first 10 weeks, the fertilized egg is called an embryo. After 10 weeks, it is called a fetus. As the fetus continues to grow, it receives oxygen and nutrients through tissue (placenta) that grows to support the developing baby. The placenta is the life support system  for the baby. It provides oxygen and nutrition and removes waste. Learning as much as you can about your pregnancy and how your baby is developing can help you enjoy the experience. It can also make you aware of when there might be a problem and when to ask questions. How long does a typical pregnancy last? A pregnancy usually lasts 280 days, or about 40 weeks. Pregnancy is divided into three periods of growth, also called trimesters:  First trimester: 0-12 weeks.  Second trimester: 13-27 weeks.  Third trimester: 28-40 weeks. The day when your baby is ready to be born (full term) is your estimated date of delivery. How does my baby develop month by month? First month  The fertilized egg attaches to the inside of the uterus.  Some cells will form the placenta. Others will form the fetus.  The arms, legs, brain, spinal cord, lungs, and heart begin to develop.  At the end of the first month, the heart begins to beat. Second month  The bones, inner ear, eyelids, hands, and feet form.  The genitals develop.  By the end of 8 weeks, all  major organs are developing. Third month  All of the internal organs are forming.  Teeth develop below the gums.  Bones and muscles begin to grow. The spine can flex.  The skin is transparent.  Fingernails and toenails begin to form.  Arms and legs continue to grow longer, and hands and feet develop.  The fetus is about 3 inches (7.6 cm) long. Fourth month  The placenta is completely formed.  The external sex organs, neck, outer ear, eyebrows, eyelids, and fingernails are formed.  The fetus can hear, swallow, and move its arms and legs.  The kidneys begin to produce urine.  The skin is covered with a white, waxy coating (vernix) and very fine hair (lanugo). Fifth month  The fetus moves around more and can be felt for the first time (quickening).  The fetus starts to sleep and wake up and may begin to suck its finger.  The nails grow to the end of the fingers.  The organ in the digestive system that makes bile (gallbladder) functions and helps to digest nutrients.  If your baby is a girl, eggs are present in her ovaries. If your baby is a boy, testicles start to move down into his scrotum. Sixth month  The lungs are formed.  The eyes open. The brain continues to develop.  Your baby has fingerprints and toe prints. Your baby's hair grows thicker.  At the end of the second trimester, the fetus is about 9 inches (22.9 cm) long. Seventh month  The fetus kicks and stretches.  The eyes are developed enough to sense changes in light.  The hands can make a grasping motion.  The fetus responds to sound. Eighth month  All organs and body systems are fully developed and functioning.  Bones harden, and taste buds develop. The fetus may hiccup.  Certain areas of the brain are still developing. The skull remains soft. Ninth month  The fetus gains about  lb (0.23 kg) each week.  The lungs are fully developed.  Patterns of sleep develop.  The fetus's head  typically moves into a head-down position (vertex) in the uterus to prepare for birth.  The fetus weighs 6-9 lb (2.72-4.08 kg) and is 19-20 inches (48.26-50.8 cm) long. What can I do to have a healthy pregnancy and help my baby develop? General instructions  Take prenatal vitamins as  directed by your health care provider. These include vitamins such as folic acid, iron, calcium, and vitamin D. They are important for healthy development.  Take medicines only as directed by your health care provider. Read labels and ask a pharmacist or your health care provider whether over-the-counter medicines, supplements, and prescription drugs are safe to take during pregnancy.  Keep all follow-up visits as directed by your health care provider. This is important. Follow-up visits include prenatal care and screening tests. How do I know if my baby is developing well? At each prenatal visit, your health care provider will do several different tests to check on your health and keep track of your baby's development. These include:  Fundal height and position. ? Your health care provider will measure your growing belly from your pubic bone to the top of the uterus using a tape measure. ? Your health care provider will also feel your belly to determine your baby's position.  Heartbeat. ? An ultrasound in the first trimester can confirm pregnancy and show a heartbeat, depending on how far along you are. ? Your health care provider will check your baby's heart rate at every prenatal visit.  Second trimester ultrasound. ? This ultrasound checks your baby's development. It also may show your baby's gender. What should I do if I have concerns about my baby's development? Always talk with your health care provider about any concerns that you may have about your pregnancy and your baby. Summary  A pregnancy usually lasts 280 days, or about 40 weeks. Pregnancy is divided into three periods of growth, also called  trimesters.  Your health care provider will monitor your baby's growth and development throughout your pregnancy.  Follow your health care provider's recommendations about taking prenatal vitamins and medicines during your pregnancy.  Talk with your health care provider if you have any concerns about your pregnancy or your developing baby. This information is not intended to replace advice given to you by your health care provider. Make sure you discuss any questions you have with your health care provider. Document Revised: 02/25/2019 Document Reviewed: 09/17/2017 Elsevier Patient Education  2020 [employer]. Commonly Asked Questions During Pregnancy  Cats: A parasite can be excreted in cat feces.  To avoid exposure you need to have another person empty the little box.  If you must empty the litter box you will need to wear gloves.  Wash your hands after handling your cat.  This parasite can also be found in raw or undercooked meat so this should also be avoided.  Colds, Sore Throats, Flu: Please check your medication sheet to see what you can take for symptoms.  If your symptoms are unrelieved by these medications please call the office.  Dental Work: Most any dental work Investment banker, corporate recommends is permitted.  X-rays should only be taken during the first trimester if absolutely necessary.  Your abdomen should be shielded with a lead apron during all x-rays.  Please notify your provider prior to receiving any x-rays.  Novocaine is fine; gas is not recommended.  If your dentist requires a note from [place] prior to dental work please call the office and we will provide one for you.  Exercise: Exercise is an important part of staying healthy during your pregnancy.  You may continue most exercises you were accustomed to prior to pregnancy.  Later in your pregnancy you will most likely notice you have difficulty with activities requiring balance like riding a bicycle.  It is important that you  listen to  your body and avoid activities that put you at a higher risk of falling.  Adequate rest and staying well hydrated are a must!  If you have questions about the safety of specific activities ask your provider.    Exposure to Children with illness: Try to avoid obvious exposure; report any symptoms to [place] when noted,  If you have chicken pos, red measles or mumps, you should be immune to these diseases.   Please do not take any vaccines while pregnant unless you have checked with your OB provider.  Fetal Movement: After 28 weeks we recommend you do "kick counts" twice daily.  Lie or sit down in a calm quiet environment and count your baby movements "kicks".  You should feel your baby at least 10 times per hour.  If you have not felt 10 kicks within the first hour get up, walk around and have something sweet to eat or drink then repeat for an additional hour.  If count remains less than 10 per hour notify your provider.  Fumigating: Follow your pest control agent's advice as to how long to stay out of your home.  Ventilate the area well before re-entering.  Hemorrhoids:   Most over-the-counter preparations can be used during pregnancy.  Check your medication to see what is safe to use.  It is important to use a stool softener or fiber in your diet and to drink lots of liquids.  If hemorrhoids seem to be getting worse please call the office.   Hot Tubs:  Hot tubs Jacuzzis and saunas are not recommended while pregnant.  These increase your internal body temperature and should be avoided.  Intercourse:  Sexual intercourse is safe during pregnancy as long as you are comfortable, unless otherwise advised by your provider.  Spotting may occur after intercourse; report any bright red bleeding that is heavier than spotting.  Labor:  If you know that you are in labor, please go to the hospital.  If you are unsure, please call the office and let us help you decide what to do.  Lifting, straining, etc:  If your job  requires heavy lifting or straining please check with your provider for any limitations.  Generally, you should not lift items heavier than that you can lift simply with your hands and arms (no back muscles)  Painting:  Paint fumes do not harm your pregnancy, but may make you ill and should be avoided if possible.  Latex or water based paints have less odor than oils.  Use adequate ventilation while painting.  Permanents & Hair Color:  Chemicals in hair dyes are not recommended as they cause increase hair dryness which can increase hair loss during pregnancy.  " Highlighting" and permanents are allowed.  Dye may be absorbed differently and permanents may not hold as well during pregnancy.  Sunbathing:  Use a sunscreen, as skin burns easily during pregnancy.  Drink plenty of fluids; avoid over heating.  Tanning Beds:  Because their possible side effects are still unknown, tanning beds are not recommended.  Ultrasound Scans:  Routine ultrasounds are performed at approximately 20 weeks.  You will be able to see your baby's general anatomy an if you would like to know the gender this can usually be determined as well.  If it is questionable when you conceived you may also receive an ultrasound early in your pregnancy for dating purposes.  Otherwise ultrasound exams are not routinely performed unless there is a medical necessity.  Although  you can request a scan we ask that you pay for it when conducted because insurance does not cover " patient request" scans.  Work: If your pregnancy proceeds without complications you may work until your due date, unless your physician or employer advises otherwise.  Round Ligament Pain/Pelvic Discomfort:  Sharp, shooting pains not associated with bleeding are fairly common, usually occurring in the second trimester of pregnancy.  They tend to be worse when standing up or when you remain standing for long periods of time.  These are the result of pressure of certain  pelvic ligaments called "round ligaments".  Rest, Tylenol and heat seem to be the most effective relief.  As the womb and fetus grow, they rise out of the pelvis and the discomfort improves.  Please notify the office if your pain seems different than that described.  It may represent a more serious condition.  Common Medications Safe in Pregnancy  Acne:      Constipation:  Benzoyl Peroxide     Colace  Clindamycin      Dulcolax Suppository  Topica Erythromycin     Fibercon  Salicylic Acid      Metamucil         Miralax AVOID:        Senakot   Accutane    Cough:  Retin-A       Cough Drops  Tetracycline      Phenergan w/ Codeine if Rx  Minocycline      Robitussin (Plain & DM)  Antibiotics:     Crabs/Lice:  Ceclor       RID  Cephalosporins    AVOID:  E-Mycins      Kwell  Keflex  Macrobid/Macrodantin   Diarrhea:  Penicillin      Kao-Pectate  Zithromax      Imodium AD         PUSH FLUIDS AVOID:       Cipro     Fever:  Tetracycline      Tylenol (Regular or Extra  Minocycline       Strength)  Levaquin      Extra Strength-Do not          Exceed 8 tabs/24 hrs Caffeine:        <285m/day (equiv. To 1 cup of coffee or  approx. 3 12 oz sodas)         Gas: Cold/Hayfever:       Gas-X  Benadryl      Mylicon  Claritin       Phazyme  **Claritin-D        Chlor-Trimeton    Headaches:  Dimetapp      ASA-Free Excedrin  Drixoral-Non-Drowsy     Cold Compress  Mucinex (Guaifenasin)     Tylenol (Regular or Extra  Sudafed/Sudafed-12 Hour     Strength)  **Sudafed PE Pseudoephedrine   Tylenol Cold & Sinus     Vicks Vapor Rub  Zyrtec  **AVOID if Problems With Blood Pressure         Heartburn: Avoid lying down for at least 1 hour after meals  Aciphex      Maalox     Rash:  Milk of Magnesia     Benadryl    Mylanta       1% Hydrocortisone Cream  Pepcid  Pepcid Complete   Sleep Aids:  Prevacid      Ambien   Prilosec       Benadryl  Rolaids  Chamomile Tea  Tums (Limit  4/day)     Unisom  Zantac       Tylenol PM         Warm milk-add vanilla or  Hemorrhoids:       Sugar for taste  Anusol/Anusol H.C.  (RX: Analapram 2.5%)  Sugar Substitutes:  Hydrocortisone OTC     Ok in moderation  Preparation H      Tucks        Vaseline lotion applied to tissue with wiping    Herpes:     Throat:  Acyclovir      Oragel  Famvir  Valtrex     Vaccines:         Flu Shot Leg Cramps:       *Gardasil  Benadryl      Hepatitis A         Hepatitis B Nasal Spray:       Pneumovax  Saline Nasal Spray     Polio Booster         Tetanus Nausea:       Tuberculosis test or PPD  Vitamin B6 25 mg TID   AVOID:    Dramamine      *Gardasil  Emetrol       Live Poliovirus  Ginger Root 250 mg QID    MMR (measles, mumps &  High Complex Carbs @ Bedtime    rebella)  Sea Bands-Accupressure    Varicella (Chickenpox)  Unisom 1/2 tab TID     *No known complications           If received before Pain:         Known pregnancy;   Darvocet       Resume series after  Lortab        Delivery  Percocet    Yeast:   Tramadol      Femstat  Tylenol 3      Gyne-lotrimin  Ultram       Monistat  Vicodin           MISC:         All Sunscreens           Hair Coloring/highlights          Insect Repellant's          (Including DEET)         Mystic Tans

## 2020-03-14 ENCOUNTER — Ambulatory Visit (INDEPENDENT_AMBULATORY_CARE_PROVIDER_SITE_OTHER): Payer: Federal, State, Local not specified - PPO

## 2020-03-14 DIAGNOSIS — N926 Irregular menstruation, unspecified: Secondary | ICD-10-CM | POA: Diagnosis not present

## 2020-03-14 DIAGNOSIS — Z3A01 Less than 8 weeks gestation of pregnancy: Secondary | ICD-10-CM

## 2020-03-14 DIAGNOSIS — Z3201 Encounter for pregnancy test, result positive: Secondary | ICD-10-CM

## 2020-03-14 LAB — DRUG PROFILE, UR, 9 DRUGS (LABCORP)
Amphetamines, Urine: NEGATIVE ng/mL
Barbiturate Quant, Ur: NEGATIVE ng/mL
Benzodiazepine Quant, Ur: NEGATIVE ng/mL
Cannabinoid Quant, Ur: NEGATIVE ng/mL
Cocaine (Metab.): NEGATIVE ng/mL
Methadone Screen, Urine: NEGATIVE ng/mL
Opiate Quant, Ur: NEGATIVE ng/mL
PCP Quant, Ur: NEGATIVE ng/mL
Propoxyphene: NEGATIVE ng/mL

## 2020-03-14 LAB — TOXOPLASMA ANTIBODIES- IGG AND  IGM
Toxoplasma Antibody- IgM: <3 AU/mL (ref 0.0–7.9)
Toxoplasma IgG Ratio: <3 IU/mL (ref 0.0–7.1)

## 2020-03-14 LAB — ANTIBODY SCREEN: Antibody Screen: NEGATIVE

## 2020-03-14 LAB — GC/CHLAMYDIA PROBE AMP
Chlamydia trachomatis, NAA: NEGATIVE
Neisseria Gonorrhoeae by PCR: NEGATIVE

## 2020-03-14 LAB — NICOTINE SCREEN, URINE: Cotinine Ql Scrn, Ur: NEGATIVE ng/mL

## 2020-03-14 LAB — URINALYSIS, ROUTINE W REFLEX MICROSCOPIC
Bilirubin, UA: NEGATIVE
Glucose, UA: NEGATIVE
Ketones, UA: NEGATIVE
Leukocytes,UA: NEGATIVE
Nitrite, UA: NEGATIVE
Protein,UA: NEGATIVE
RBC, UA: NEGATIVE
Specific Gravity, UA: 1.021 (ref 1.005–1.030)
Urobilinogen, Ur: 0.2 mg/dL (ref 0.2–1.0)
pH, UA: 6 (ref 5.0–7.5)

## 2020-03-14 LAB — HIV ANTIBODY (ROUTINE TESTING W REFLEX): HIV Screen 4th Generation wRfx: NONREACTIVE

## 2020-03-14 LAB — HGB SOLU + RFLX FRAC: Sickle Solubility Test - HGBRFX: NEGATIVE

## 2020-03-14 LAB — RPR: RPR Ser Ql: NONREACTIVE

## 2020-03-14 LAB — ABO AND RH: Rh Factor: POSITIVE

## 2020-03-14 LAB — HEPATITIS B SURFACE ANTIGEN: Hepatitis B Surface Ag: NEGATIVE

## 2020-03-14 LAB — RUBELLA SCREEN: Rubella Antibodies, IGG: 1.37 index (ref >0.99)

## 2020-03-14 LAB — VARICELLA ZOSTER ANTIBODY, IGG: Varicella zoster IgG: 257 index (ref >165)

## 2020-03-14 NOTE — Telephone Encounter (Signed)
Pt is aware of information given by [employer] concerning [place] gummies vitamins.

## 2020-03-15 LAB — URINE CULTURE, OB REFLEX

## 2020-03-15 LAB — CULTURE, OB URINE

## 2020-03-17 ENCOUNTER — Encounter: Payer: Federal, State, Local not specified - PPO | Admitting: Obstetrics and Gynecology

## 2020-03-19 ENCOUNTER — Encounter: Payer: Self-pay | Admitting: Obstetrics and Gynecology

## 2020-04-11 ENCOUNTER — Other Ambulatory Visit: Payer: Self-pay

## 2020-04-11 ENCOUNTER — Telehealth: Payer: Self-pay | Admitting: Obstetrics and Gynecology

## 2020-04-11 ENCOUNTER — Encounter: Payer: Self-pay | Admitting: Obstetrics and Gynecology

## 2020-04-11 ENCOUNTER — Other Ambulatory Visit (INDEPENDENT_AMBULATORY_CARE_PROVIDER_SITE_OTHER): Payer: Federal, State, Local not specified - PPO

## 2020-04-11 ENCOUNTER — Ambulatory Visit (INDEPENDENT_AMBULATORY_CARE_PROVIDER_SITE_OTHER): Payer: Federal, State, Local not specified - PPO | Admitting: Obstetrics and Gynecology

## 2020-04-11 VITALS — BP 111/71 | HR 84 | Ht 59.0 in | Wt 134.6 lb

## 2020-04-11 DIAGNOSIS — O200 Threatened abortion: Secondary | ICD-10-CM

## 2020-04-11 DIAGNOSIS — Z3A11 11 weeks gestation of pregnancy: Secondary | ICD-10-CM

## 2020-04-11 DIAGNOSIS — Z3401 Encounter for supervision of normal first pregnancy, first trimester: Secondary | ICD-10-CM

## 2020-04-11 DIAGNOSIS — R87612 Low grade squamous intraepithelial lesion on cytologic smear of cervix (LGSIL): Secondary | ICD-10-CM

## 2020-04-11 DIAGNOSIS — Z3A08 8 weeks gestation of pregnancy: Secondary | ICD-10-CM | POA: Diagnosis not present

## 2020-04-11 DIAGNOSIS — O039 Complete or unspecified spontaneous abortion without complication: Secondary | ICD-10-CM

## 2020-04-11 LAB — POCT URINALYSIS DIPSTICK OB
Bilirubin, UA: NEGATIVE
Blood, UA: NEGATIVE
Glucose, UA: NEGATIVE
Ketones, UA: NEGATIVE
Leukocytes, UA: NEGATIVE
Nitrite, UA: NEGATIVE
POC,PROTEIN,UA: NEGATIVE
Spec Grav, UA: 1.015 (ref 1.010–1.025)
Urobilinogen, UA: 0.2 E.U./dL
pH, UA: 6.5 (ref 5.0–8.0)

## 2020-04-11 NOTE — Telephone Encounter (Signed)
Ok.  Let me know which day she would prefer.  Can perform Thursday or Friday.

## 2020-04-11 NOTE — Progress Notes (Signed)
ROB-Pt present for initial prenatal care and PE. Pt stated noticing vaginal bleeding after sexually intercourse. Pt stated that she has not noticed anymore vaginal bleeding since then. Pt stated having light cramping off and on in the abd area. [employer] completed today.

## 2020-04-11 NOTE — Telephone Encounter (Signed)
Please advise. Thanks Fikisha 

## 2020-04-11 NOTE — Telephone Encounter (Signed)
Patient called in wanting to inform provider that she would like to go through with the procedure that was discussed today at her appointment.

## 2020-04-11 NOTE — Progress Notes (Signed)
OBSTETRIC INITIAL PRENATAL VISIT  Subjective:    Samantha Ramsey is being seen today for her first obstetrical visit.  This is not a planned pregnancy. She is a 24 y.o. G1P0000 female at [redacted]w[redacted]d gestation, Estimated Date of Delivery: 10/31/20 with Patient's last menstrual period was 01/23/2020.,  consistent with 7 week sono. Her obstetrical history is significant for none. Relationship with FOB: significant other, living together "[place]". Patient does intend to breast feed. Pregnancy history fully reviewed.  Patient reports that she had some bleeding after intercourse and cramping ~ 10 days ago. Denies any further bleeding at this time, but still notes some light cramping on and off.    OB History  Gravida Para Term Preterm AB Living  1 0 0 0 0 0  SAB TAB Ectopic Multiple Live Births  0 0 0 0 0    # Outcome Date GA Lbr Len/2nd Weight Sex Delivery Anes PTL Lv  1 Current             Gynecologic History:  Last pap smear was 12/01/2019.  Results were abnormal: LGSIL.  Needs colposcopy postpartum. Denies h/o abnormal pap smears in the past.  Admits history of STIs, Chlamydia 2 years ago, treated. Remote h/o gonorrhea . Contraception: Was on combined OCPs prior to conception.    Past Medical History:  Diagnosis Date  . Chlamydia 07/2018   treated  . Gonorrhea 09/2018   treated  . History of gonorrhea 12/15/2018  . Yeast vaginitis     Family History  Problem Relation Age of Onset  . Hypertension Mother   . Healthy Father   . Cervical cancer Maternal Grandmother   . Ovarian cancer Maternal Grandmother   . Breast cancer Neg Hx   . Colon cancer Neg Hx     Past Surgical History:  Procedure Laterality Date  . WISDOM TOOTH EXTRACTION  10/2016   all four;     Social History   Socioeconomic History  . Marital status: Single    Spouse name: Not on file  . Number of children: 0  . Years of education: Not on file  . Highest education level: Some college, no degree    Occupational History  . Occupation: full time Ship broker   . Occupation: works part time     Comment: Therapist, art as a host   Tobacco Use  . Smoking status: Never Smoker  . Smokeless tobacco: Never Used  Substance and Sexual Activity  . Alcohol use: Not Currently    Alcohol/week: 1.0 standard drinks    Types: 1 Standard drinks or equivalent per week  . Drug use: No  . Sexual activity: Yes    Partners: Male  Other Topics Concern  . Not on file  Social History Narrative   She is going [employer] and is getting a degree in Designer, fashion/clothing    Social Determinants of Health   Financial Resource Strain:   . Difficulty of Paying Living Expenses:   Food Insecurity:   . Worried About Charity fundraiser in the Last Year:   . Arboriculturist in the Last Year:   Transportation Needs:   . Film/video editor (Medical):   Marland Kitchen Lack of Transportation (Non-Medical):   Physical Activity:   . Days of Exercise per Week:   . Minutes of Exercise per Session:   Stress:   . Feeling of Stress :   Social Connections:   . Frequency of Communication with Friends and Family:   .  Frequency of Social Gatherings with Friends and Family:   . Attends Religious Services:   . Active Member of Clubs or Organizations:   . Attends Archivist Meetings:   Marland Kitchen Marital Status:   Intimate Partner Violence:   . Fear of Current or Ex-Partner:   . Emotionally Abused:   Marland Kitchen Physically Abused:   . Sexually Abused:     Current Outpatient Medications on File Prior to Visit  Medication Sig Dispense Refill  . esomeprazole (NEXIUM) 20 MG capsule Take 1 capsule (20 mg total) by mouth daily before breakfast. 90 capsule 0  . famotidine (PEPCID) 20 MG tablet Take 1 tablet (20 mg total) by mouth 2 (two) times daily. 60 tablet 0  . ondansetron (ZOFRAN) 4 MG tablet Take 1 tablet (4 mg total) by mouth every 8 (eight) hours as needed for nausea or vomiting. 20 tablet 0  . promethazine (PHENERGAN) 25 MG tablet Take  1 tablet (25 mg total) by mouth every 6 (six) hours as needed for nausea or vomiting. 30 tablet 1  . Doxylamine-Pyridoxine 10-10 MG TBEC Day 1: Take 2 tablets, by mouth at bedtime. Day 2: Take 2 tablets at bedtime. If your nausea and vomiting are controlled on Day 2, continue to take 2 tablets every night at bedtime. Day 3: If you still have nausea and vomiting on Day 2, take 3 tablets on Day 3 (1 tablet in the morning and 2 tablets at bedtime). Day 4: If your nausea and vomiting are controlled on Day 3, continue to take the 3 tablets as described. If you still have nausea and vomiting on Day 3, start taking 4 tablets each day (1 tablet in the morning, 1 tablet in the afternoon, and 2 tablets at bedtime). (Patient not taking: Reported on 04/11/2020) 60 tablet 0   No current facility-administered medications on file prior to visit.    Allergies  Allergen Reactions  . Flagyl [Metronidazole] Nausea And Vomiting     Review of Systems General: Not Present- Fever, Weight Loss and Weight Gain. Skin: Not Present- Rash. HEENT: Not Present- Blurred Vision, Headache and Bleeding Gums. Respiratory: Not Present- Difficulty Breathing. Breast: Not Present- Breast Mass. Cardiovascular: Not Present- Chest Pain, Elevated Blood Pressure, Fainting / Blacking Out and Shortness of Breath. Gastrointestinal: Not Present- Abdominal Pain, Constipation, Nausea and Vomiting. Female Genitourinary: Not Present- Frequency, Painful Urination, Pelvic Pain, Vaginal Discharge, Contractions, regular, Fetal Movements Decreased, Urinary Complaints and Vaginal Fluid.  Vaginal Bleeding and cramping (see HPI) Musculoskeletal: Not Present- Back Pain and Leg Cramps. Neurological: Not Present- Dizziness. Psychiatric: Not Present- Depression.     Objective:   Blood pressure 111/71, pulse 84, weight 134 lb 9.6 oz (61.1 kg), last menstrual period 01/23/2020.  Body mass index is 27.19 kg/m.  General Appearance:    Alert, cooperative,  no distress, appears stated age, overweight  Head:    Normocephalic, without obvious abnormality, atraumatic  Eyes:    PERRL, conjunctiva/corneas clear, EOM's intact, both eyes  Ears:    Normal external ear canals, both ears  Nose:   Nares normal, septum midline, mucosa normal, no drainage or sinus tenderness  Throat:   Lips, mucosa, and tongue normal; teeth and gums normal  Neck:   Supple, symmetrical, trachea midline, no adenopathy; thyroid: no enlargement/tenderness/nodules; no carotid bruit or JVD  Back:     Symmetric, no curvature, ROM normal, no CVA tenderness  Lungs:     Clear to auscultation bilaterally, respirations unlabored  Chest Wall:    No  tenderness or deformity   Heart:    Regular rate and rhythm, S1 and S2 normal, no murmur, rub or gallop  Breast Exam:    No tenderness, masses, or nipple abnormality  Abdomen:     Soft, non-tender, bowel sounds active all four quadrants, no masses, no organomegaly.  FHT unable to obtain on Doppler.  Genitalia:    Pelvic:external genitalia normal, vagina without lesions, discharge, or tenderness, rectovaginal septum  normal. Cervix normal in appearance, no cervical motion tenderness, no adnexal masses or tenderness.  Pregnancy positive findings: uterine enlargement: 8-10 wk size, nontender.   Rectal:    Normal external sphincter.  No hemorrhoids appreciated. Internal exam not done.   Extremities:   Extremities normal, atraumatic, no cyanosis or edema  Pulses:   2+ and symmetric all extremities  Skin:   Skin color, texture, turgor normal, no rashes or lesions  Lymph nodes:   Cervical, supraclavicular, and axillary nodes normal  Neurologic:   CNII-XII intact, normal strength, sensation and reflexes throughout      Imaging:  Patient Name: NORMANDY ZITTEL DOB: [birth year] MRN: [id number] ULTRASOUND REPORT  Location: [facility] Date of Service: 04/11/2020   Indications:dating Findings:  Nelda Marseille intrauterine pregnancy is visualized  with a CRL consistent with [redacted]w[redacted]d gestation, giving an (U/S) EDD of 11/19/2020. The (U/S) EDD is not consistent with the clinically established EDD of 10/31/2020.  FHR: - negitive CRL measurement: 17.7 mm Yolk sac is visualized and appears normal and early anatomy is normal. Amnion: not visualized   Right Ovary is normal in appearance. Left Ovary is normal appearance. Corpus luteal cyst:  is not visualized Survey of the adnexa demonstrates no adnexal masses. There is no free peritoneal fluid in the cul de sac.  Impression: 1. [redacted]w[redacted]d Viable Singleton Intrauterine pregnancy by U/S. 2. (U/S) EDD is consistent with Clinically established EDD of 11/19/2020. 3. No FHT.  Recommendations: 1.Clinical correlation with the patient's History and Physical Exam.  Jenine M. Albertine Grates     RDMS      Assessment:   1. Encounter for supervision of normal first pregnancy in first trimester   2. [redacted] weeks gestation of pregnancy   3. LGSIL on Pap smear of cervix   4. Miscarriage     Plan:   - Patient with postcoital bleeding. Unable to obtain FHT by dopplers.  US performed noting missed AB at [redacted] weeks gestation.  - Discussed management of missed abortion: expectant management vs misoprostol vs D&E.  Risks and benefits of all modalities discussed; all questions answered.  As patient has been 3 weeks already without symptoms of miscarriage, would not encourage to wait too much longer due to risk of infection. If patient opts for D&E, she can call the office and this will be scheduled for her sometime in the subsequent few days depending on surgeon and OR availability. Risks of surgery including bleeding, infection, injury to surrounding organs, need for additional procedures, possibility of intrauterine scarring which may impair future fertility, risk of retained products which may require further management and other postoperative/anesthesia complications will be explained to patient.  If she changes her  mind and wants misoprostol administration, a prescription for the Misoprostol and pain medication will be sent to her pharmacy.  Risks and benefits of medical management were carefully explained, including a success rate of 80-90%, the need for another person to be at home with her, and to call/come in if she had heavy bleeding, dizziness, or severe pain not relieved by medication.  She would follow up in one week after misoprostol administration; if there has been no passage of pregnancy, would consider repeat misoprostol vs D&E.  Bleeding precautions should be strictly emphasized.  - LGSIL pap smear.  As this is patient's first abnormal pap smear and based on age, can repeat pap smear in 1 year.   50% of 30 min visit spent on counseling and coordination of care.   Rubie Maid, MD [facility]

## 2020-04-11 NOTE — H&P (Signed)
@[place] @   GYNECOLOGY PREOPERATIVE HISTORY AND PHYSICAL   Subjective:  Samantha Ramsey is a 24 y.o. G1P0000 here for surgical management of missed abortion at [redacted] weeks gestation. No significant preoperative concerns.  Proposed surgery: Dilation and Evacuation   Pertinent Gynecological History: Menses: none. Patient is currently pregnant Contraception: OCPs prior to conception Last pap: abnormal: LGSIL Date: 12/01/2019   Past Medical History:  Diagnosis Date  . Chlamydia 07/2018   treated  . Gonorrhea 09/2018   treated  . History of gonorrhea 12/15/2018  . Yeast vaginitis    Past Surgical History:  Procedure Laterality Date  . WISDOM TOOTH EXTRACTION  10/2016   all four;     OB History  Gravida Para Term Preterm AB Living  1 0 0 0 0 0  SAB TAB Ectopic Multiple Live Births  0 0 0 0 0    # Outcome Date GA Lbr Len/2nd Weight Sex Delivery Anes PTL Lv  1 Current             Family History  Problem Relation Age of Onset  . Hypertension Mother   . Healthy Father   . Cervical cancer Maternal Grandmother   . Ovarian cancer Maternal Grandmother   . Breast cancer Neg Hx   . Colon cancer Neg Hx     Social History   Socioeconomic History  . Marital status: Single    Spouse name: Not on file  . Number of children: 0  . Years of education: Not on file  . Highest education level: Some college, no degree  Occupational History  . Occupation: full time Ship broker   . Occupation: works part time     Comment: Therapist, art as a host   Tobacco Use  . Smoking status: Never Smoker  . Smokeless tobacco: Never Used  Substance and Sexual Activity  . Alcohol use: Not Currently    Alcohol/week: 1.0 standard drinks    Types: 1 Standard drinks or equivalent per week  . Drug use: No  . Sexual activity: Yes    Partners: Male  Other Topics Concern  . Not on file  Social History Narrative   She is going [employer] and is getting a degree in Designer, fashion/clothing    Social  Determinants of Health   Financial Resource Strain:   . Difficulty of Paying Living Expenses:   Food Insecurity:   . Worried About Charity fundraiser in the Last Year:   . Arboriculturist in the Last Year:   Transportation Needs:   . Film/video editor (Medical):   Marland Kitchen Lack of Transportation (Non-Medical):   Physical Activity:   . Days of Exercise per Week:   . Minutes of Exercise per Session:   Stress:   . Feeling of Stress :   Social Connections:   . Frequency of Communication with Friends and Family:   . Frequency of Social Gatherings with Friends and Family:   . Attends Religious Services:   . Active Member of Clubs or Organizations:   . Attends Archivist Meetings:   Marland Kitchen Marital Status:   Intimate Partner Violence:   . Fear of Current or Ex-Partner:   . Emotionally Abused:   Marland Kitchen Physically Abused:   . Sexually Abused:     Current Outpatient Medications on File Prior to Visit  Medication Sig Dispense Refill  . esomeprazole (NEXIUM) 20 MG capsule Take 1 capsule (20 mg total) by mouth daily before breakfast. 90 capsule 0  .  famotidine (PEPCID) 20 MG tablet Take 1 tablet (20 mg total) by mouth 2 (two) times daily. 60 tablet 0  . ondansetron (ZOFRAN) 4 MG tablet Take 1 tablet (4 mg total) by mouth every 8 (eight) hours as needed for nausea or vomiting. 20 tablet 0  . promethazine (PHENERGAN) 25 MG tablet Take 1 tablet (25 mg total) by mouth every 6 (six) hours as needed for nausea or vomiting. 30 tablet 1  . Doxylamine-Pyridoxine 10-10 MG TBEC Day 1: Take 2 tablets, by mouth at bedtime. Day 2: Take 2 tablets at bedtime. If your nausea and vomiting are controlled on Day 2, continue to take 2 tablets every night at bedtime. Day 3: If you still have nausea and vomiting on Day 2, take 3 tablets on Day 3 (1 tablet in the morning and 2 tablets at bedtime). Day 4: If your nausea and vomiting are controlled on Day 3, continue to take the 3 tablets as described. If you still have  nausea and vomiting on Day 3, start taking 4 tablets each day (1 tablet in the morning, 1 tablet in the afternoon, and 2 tablets at bedtime). (Patient not taking: Reported on 04/11/2020) 60 tablet 0   No current facility-administered medications on file prior to visit.    Allergies  Allergen Reactions  . Flagyl [Metronidazole] Nausea And Vomiting     Review of Systems Constitutional: No recent fever/chills/sweats Respiratory: No recent cough/bronchitis Cardiovascular: No chest pain Gastrointestinal: No recent nausea/vomiting/diarrhea Genitourinary: No UTI symptoms Hematologic/lymphatic:No history of coagulopathy or recent blood thinner use    Objective:   Blood pressure 111/71, pulse 84, weight 134 lb 9.6 oz (61.1 kg), last menstrual period 01/23/2020. CONSTITUTIONAL: Well-developed, well-nourished female in no acute distress.  HENT:  Normocephalic, atraumatic, External right and left ear normal. Oropharynx is clear and moist EYES: Conjunctivae and EOM are normal. Pupils are equal, round, and reactive to light. No scleral icterus.  NECK: Normal range of motion, supple, no masses SKIN: Skin is warm and dry. No rash noted. Not diaphoretic. No erythema. No pallor. NEUROLOGIC: Alert and oriented to person, place, and time. Normal reflexes, muscle tone coordination. No cranial nerve deficit noted. PSYCHIATRIC: Normal mood and affect. Normal behavior. Normal judgment and thought content. CARDIOVASCULAR: Normal heart rate noted, regular rhythm RESPIRATORY: Effort and breath sounds normal, no problems with respiration noted ABDOMEN: Soft, nontender, nondistended. PELVIC: Deferred MUSCULOSKELETAL: Normal range of motion. No edema and no tenderness. 2+ distal pulses.    Labs: Lab Results  Component Value Date   WBC 6.2 12/01/2019   HGB 13.7 12/01/2019   HCT 41.2 12/01/2019   MCV 92 12/01/2019   PLT 209 12/01/2019    Results for SHARDI, DAUPHINAIS I (MRN [id number]) as of 04/11/2020  23:18  Ref. Range 03/13/2020 10:29  ABO Grouping Unknown O  RH Type  Unknown Positive  Antibody Screen Latest Ref Range: Negative  Negative    Imaging Studies: Patient Name: QUINTELLA BROZOWSKI DOB: 1996/10/26 MRN: [id number] ULTRASOUND REPORT  Location: [facility] Date of Service: 04/11/2020   Indications:dating Findings:  Nelda Marseille intrauterine pregnancy is visualized with a CRL consistent with [redacted]w[redacted]d gestation, giving an (U/S) EDD of 11/19/2020. The (U/S) EDD is not consistent with the clinically established EDD of 10/31/2020.  FHR: - negitive CRL measurement: 17.7 mm Yolk sac is visualized and appears normal and early anatomy is normal. Amnion: not visualized   Right Ovary is normal in appearance. Left Ovary is normal appearance. Corpus luteal cyst:  is not visualized  Survey of the adnexa demonstrates no adnexal masses. There is no free peritoneal fluid in the cul de sac.  Impression: 1. [redacted]w[redacted]d Viable Singleton Intrauterine pregnancy by U/S. 2. (U/S) EDD is consistent with Clinically established EDD of 11/19/2020. 3. No FHT.  Recommendations: 1.Clinical correlation with the patient's History and Physical Exam.  Jenine M. Albertine Grates     RDMS       Assessment:    Missed abortion    Plan:    Counseling:Risks of surgery including bleeding, infection, injury to surrounding organs, need for additional procedures, possibility of intrauterine scarring which may impair future fertility, risk of retained products which may require further management and other postoperative/anesthesia complications will be explained to patient.  The patient concurred with the proposed plan, giving informed written consent for the surgery.   Preop testing ordered. Instructions reviewed, including NPO after midnight.     Rubie Maid, MD [facility]

## 2020-04-11 NOTE — Patient Instructions (Signed)

## 2020-04-12 ENCOUNTER — Other Ambulatory Visit
Admission: RE | Admit: 2020-04-12 | Discharge: 2020-04-12 | Disposition: A | Discharge status: Home / Self Care | Payer: Federal, State, Local not specified - PPO | Source: Ambulatory Visit | Attending: Obstetrics and Gynecology | Admitting: Obstetrics and Gynecology

## 2020-04-12 DIAGNOSIS — Z20822 Contact with and (suspected) exposure to covid-19: Secondary | ICD-10-CM | POA: Insufficient documentation

## 2020-04-12 DIAGNOSIS — Z01812 Encounter for preprocedural laboratory examination: Secondary | ICD-10-CM | POA: Diagnosis not present

## 2020-04-12 LAB — SARS CORONAVIRUS 2 (TAT 6-24 HRS): SARS Coronavirus 2: NEGATIVE

## 2020-04-12 NOTE — Telephone Encounter (Signed)
Spoke with pt. She would like to have it done on Thursday, May 27th at [place]

## 2020-04-12 NOTE — Telephone Encounter (Signed)
Spoke with pt and she stated that she would to have it done on Thursday, May 27th at 11am.

## 2020-04-13 ENCOUNTER — Other Ambulatory Visit: Payer: Self-pay

## 2020-04-13 ENCOUNTER — Ambulatory Visit: Payer: Federal, State, Local not specified - PPO | Admitting: Registered Nurse

## 2020-04-13 ENCOUNTER — Ambulatory Visit
Admission: RE | Admit: 2020-04-13 | Discharge: 2020-04-13 | Disposition: A | Discharge status: Home / Self Care | Payer: Federal, State, Local not specified - PPO | Attending: Obstetrics and Gynecology | Admitting: Obstetrics and Gynecology

## 2020-04-13 ENCOUNTER — Encounter: Payer: Self-pay | Admitting: Obstetrics and Gynecology

## 2020-04-13 ENCOUNTER — Encounter
Admission: RE | Disposition: A | Discharge status: Home / Self Care | Payer: Self-pay | Source: Home / Self Care | Attending: Obstetrics and Gynecology

## 2020-04-13 DIAGNOSIS — O99611 Diseases of the digestive system complicating pregnancy, first trimester: Secondary | ICD-10-CM | POA: Insufficient documentation

## 2020-04-13 DIAGNOSIS — K219 Gastro-esophageal reflux disease without esophagitis: Secondary | ICD-10-CM | POA: Insufficient documentation

## 2020-04-13 DIAGNOSIS — Z79899 Other long term (current) drug therapy: Secondary | ICD-10-CM | POA: Diagnosis not present

## 2020-04-13 DIAGNOSIS — Z881 Allergy status to other antibiotic agents status: Secondary | ICD-10-CM | POA: Insufficient documentation

## 2020-04-13 DIAGNOSIS — O021 Missed abortion: Secondary | ICD-10-CM | POA: Diagnosis not present

## 2020-04-13 DIAGNOSIS — Z9889 Other specified postprocedural states: Secondary | ICD-10-CM

## 2020-04-13 HISTORY — PX: DILATION AND EVACUATION: SHX1459

## 2020-04-13 LAB — CBC
HCT: 38.5 % (ref 36.0–46.0)
Hemoglobin: 13.2 g/dL (ref 12.0–15.0)
MCH: 31.1 pg (ref 26.0–34.0)
MCHC: 34.3 g/dL (ref 30.0–36.0)
MCV: 90.8 fL (ref 80.0–100.0)
Platelets: 185 10*3/uL (ref 150–400)
RBC: 4.24 MIL/uL (ref 3.87–5.11)
RDW: 11.9 % (ref 11.5–15.5)
WBC: 6.8 10*3/uL (ref 4.0–10.5)
nRBC: 0 % (ref 0.0–0.2)

## 2020-04-13 SURGERY — DILATION AND EVACUATION, UTERUS
Anesthesia: General

## 2020-04-13 MED ORDER — CHLORHEXIDINE GLUCONATE 0.12 % MT SOLN: 15.0000 mL | Freq: Once | OROMUCOSAL | Status: AC

## 2020-04-13 MED ORDER — PROMETHAZINE HCL 25 MG/ML IJ SOLN: 6.2500 mg | INTRAMUSCULAR | Status: DC | PRN

## 2020-04-13 MED ORDER — IBUPROFEN 800 MG PO TABS
800.0000 mg | ORAL_TABLET | Freq: Three times a day (TID) | ORAL | 0 refills | Status: DC | PRN
Start: 2020-04-13 — End: 2020-04-20

## 2020-04-13 MED ORDER — DOXYCYCLINE HYCLATE 100 MG IV SOLR
200.0000 mg | INTRAVENOUS | Status: DC
  Filled 2020-04-13: qty 200

## 2020-04-13 MED ORDER — HYDROCODONE-ACETAMINOPHEN 7.5-325 MG PO TABS
1.0000 | ORAL_TABLET | Freq: Once | ORAL | Status: DC | PRN
  Filled 2020-04-13: qty 1

## 2020-04-13 MED ORDER — KETOROLAC TROMETHAMINE 30 MG/ML IJ SOLN: 30.0000 mg | Freq: Once | INTRAMUSCULAR | Status: DC | PRN

## 2020-04-13 MED ORDER — ACETAMINOPHEN 160 MG/5ML PO SOLN
325.0000 mg | ORAL | Status: DC | PRN
  Filled 2020-04-13: qty 20.3

## 2020-04-13 MED ORDER — ORAL CARE MOUTH RINSE: 15.0000 mL | Freq: Once | OROMUCOSAL | Status: AC

## 2020-04-13 MED ORDER — MIDAZOLAM HCL 2 MG/2ML IJ SOLN
INTRAMUSCULAR | Status: AC
  Filled 2020-04-13: qty 2

## 2020-04-13 MED ORDER — FENTANYL CITRATE (PF) 100 MCG/2ML IJ SOLN: 25.0000 ug | INTRAMUSCULAR | Status: DC | PRN

## 2020-04-13 MED ORDER — LIDOCAINE HCL (PF) 1 % IJ SOLN
INTRAMUSCULAR | Status: AC
  Filled 2020-04-13: qty 30

## 2020-04-13 MED ORDER — PROPOFOL 10 MG/ML IV BOLUS
INTRAVENOUS | Status: AC
  Filled 2020-04-13: qty 20

## 2020-04-13 MED ORDER — LACTATED RINGERS IV SOLN: INTRAVENOUS | Status: DC

## 2020-04-13 MED ORDER — CHLORHEXIDINE GLUCONATE 0.12 % MT SOLN
OROMUCOSAL | Status: AC
  Administered 2020-04-13: 15 mL via OROMUCOSAL
  Filled 2020-04-13: qty 15

## 2020-04-13 MED ORDER — ACETAMINOPHEN 325 MG PO TABS: 325.0000 mg | ORAL_TABLET | ORAL | Status: DC | PRN

## 2020-04-13 MED ORDER — FENTANYL CITRATE (PF) 100 MCG/2ML IJ SOLN
INTRAMUSCULAR | Status: AC
  Filled 2020-04-13: qty 2

## 2020-04-13 SURGICAL SUPPLY — 24 items
CATH ROBINSON RED A/P 16FR (CATHETERS) ×2 IMPLANT
COVER WAND RF STERILE (DRAPES) IMPLANT
DRSG TELFA 3X8 NADH (GAUZE/BANDAGES/DRESSINGS) ×2 IMPLANT
FILTER UTR ASPR SPEC (MISCELLANEOUS) ×1 IMPLANT
FLTR UTR ASPR SPEC (MISCELLANEOUS) ×2
GAUZE 4X4 16PLY RFD (DISPOSABLE) ×2 IMPLANT
GLOVE BIO SURGEON STRL SZ 6.5 (GLOVE) ×2 IMPLANT
GLOVE INDICATOR 7.0 STRL GRN (GLOVE) ×2 IMPLANT
GOWN STRL REUS W/ TWL LRG LVL3 (GOWN DISPOSABLE) ×1 IMPLANT
GOWN STRL REUS W/TWL LRG LVL3 (GOWN DISPOSABLE) ×1
KIT BERKELEY 1ST TRIMESTER 3/8 (MISCELLANEOUS) ×2 IMPLANT
KIT TURNOVER KIT A (KITS) ×2 IMPLANT
NEEDLE HYPO 25X1 1.5 SAFETY (NEEDLE) ×2 IMPLANT
NEEDLE SPNL 22GX5 LNG QUINC BK (NEEDLE) ×2 IMPLANT
PACK DNC HYST (MISCELLANEOUS) ×2 IMPLANT
PAD OB MATERNITY 4.3X12.25 (PERSONAL CARE ITEMS) ×2 IMPLANT
PAD PREP 24X41 OB/GYN DISP (PERSONAL CARE ITEMS) ×2 IMPLANT
SET BERKELEY SUCTION TUBING (SUCTIONS) ×2 IMPLANT
SOL PREP PVP 2OZ (MISCELLANEOUS) ×2
SOLUTION PREP PVP 2OZ (MISCELLANEOUS) ×1 IMPLANT
SYR 10ML LL (SYRINGE) ×2 IMPLANT
VACURETTE 10 RIGID CVD (CANNULA) IMPLANT
VACURETTE 12 RIGID CVD (CANNULA) IMPLANT
VACURETTE 8 RIGID CVD (CANNULA) ×2 IMPLANT

## 2020-04-13 NOTE — Op Note (Signed)
Procedure(s): DILATATION AND EVACUATION Procedure Note  Samantha Ramsey female 25 y.o. 04/13/2020  Indications: The patient is a 24 y.o. G27P0000 female with missed abortion at [redacted] weeks gestation.  Desires surgical management.   Pre-operative Diagnosis: Missed abortion  Post-operative Diagnosis: Same  Surgeon: Rubie Maid, MD  Assistants:  None  Anesthesia: General endotracheal anesthesia  Procedure Details: The patient was seen in the Holding Room. The risks, benefits, complications, treatment options, and expected outcomes were discussed with the patient.  The patient concurred with the proposed plan, giving informed consent.  The site of surgery properly noted/marked. The patient was taken to the Operating Room, identified as Samantha Ramsey and the procedure verified as Procedure(s) (LRB): DILATATION AND EVACUATION (N/A).  The patient was brought to the operating room where she was placed into the supine position.  General LMA anesthesia was induced without incident. A Time out was performed.  She was placed in the dorsal lithotomy position using candy cane stirrups, and was examined.  A Betadine prep and drape was performed in sterile fashion.   A  Red Robinson catheter was used to drain the bladder of urine. A weighted speculum was placed into  the vagina and a single tooth tenaculum was applied to the anterior lip of the cervix. The cervix was gently dilated using Hank's Dilators to accommodate a 8 mm suction curette, that was gently advanced to the uterine fundus.  The suction device was then activated and the curette was slowly rotated to clear the uterine cavity of products of conception.  A sharp curettage with a serrated curette  was then performed to confirm complete emptying of the uterus. The tenaculum was removed along with all instruments  from the  vagina.   The patient was awakened, mobilized and taken to the recovery room in satisfactory condition.  The procedure was  well-tolerated.  The patient will be discharged to home as per PACU criteria.  Routine postoperative instructions were given along with a prescription for analgesics.  She will follow up in the clinic in 1-2 weeks for postoperative evaluation.   Findings: The uterus was sounded to 8.5 cm POCs present in suction curette   Estimated Blood Loss:  200 ml      Drains: straight catheterization prior to procedure with 20 ml of clear urine         Total IV Fluids: 300 ml  Specimens: Products of conception; sent to Pathology         Implants: None         Complications:  None; patient tolerated the procedure well.         Disposition: PACU - hemodynamically stable.         Condition: stable   Rubie Maid, MD [facility]

## 2020-04-13 NOTE — Discharge Instructions (Signed)
AMBULATORY SURGERY  DISCHARGE INSTRUCTIONS   1) The drugs that you were given will stay in your system until tomorrow so for the next 24 hours you should not:  A) Drive an automobile B) Make any legal decisions C) Drink any alcoholic beverage   2) You may resume regular meals tomorrow.  Today it is better to start with liquids and gradually work up to solid foods.  You may eat anything you prefer, but it is better to start with liquids, then soup and crackers, and gradually work up to solid foods.   3) Please notify your doctor immediately if you have any unusual bleeding, trouble breathing, redness and pain at the surgery site, drainage, fever, or pain not relieved by medication.    4) Additional Instructions:        Please contact your physician with any problems or Same Day Surgery at [phone], Monday through Friday 6 am to 4 pm, or [place] at [facility] number at [phone].Managing Pregnancy Loss Pregnancy loss can happen any time during a pregnancy. Often the cause is not known. It is rarely because of anything you did. Pregnancy loss in early pregnancy (during the first trimester) is called a miscarriage. This type of pregnancy loss is the most common. Pregnancy loss that happens after 20 weeks of pregnancy is called fetal demise if the baby's heart stops beating before birth. Fetal demise is much less common. Some women experience spontaneous labor shortly after fetal demise resulting in a stillborn birth (stillbirth). Any pregnancy loss can be devastating. You will need to recover both physically and emotionally. Most women are able to get pregnant again after a pregnancy loss and deliver a healthy baby. How to manage emotional recovery  Pregnancy loss is very hard emotionally. You may feel many different emotions while you grieve. You may feel sad and angry. You may also feel guilty. It is normal to have periods of crying. Emotional recovery can take  longer than physical recovery. It is different for everyone. Taking these steps can help you in managing this loss:  Remember that it is unlikely you did anything to cause the pregnancy loss.  Share your thoughts and feelings with friends, family, and your partner. Remember that your partner is also recovering emotionally.  Make sure you have a good support system. Do not spend too much time alone.  Meet with a pregnancy loss counselor or join a pregnancy loss support group.  Get enough sleep and eat a healthy diet. Return to regular exercise when you have recovered physically.  Do not use drugs or alcohol to manage your emotions.  Consider seeing a mental health professional to help you recover emotionally.  Ask a friend or loved one to help you decide what to do with any clothing and nursery items you received for your baby. In the case of a stillbirth, many women benefit from taking additional steps in the grieving process. You may want to:  Hold your baby after the birth.  Name your baby.  Request a birth certificate.  Create a keepsake such as handprints or footprints.  Dress your baby and have a picture taken.  Make funeral arrangements.  Ask for a baptism or blessing. Hospitals have staff members who can help you with all these arrangements. How to recognize emotional stress It is normal to have emotional stress after a pregnancy loss. But emotional stress that lasts a long time or becomes severe requires treatment. Watch out for these  signs of severe emotional stress:  Sadness, anger, or guilt that is not going away and is interfering with your normal activities.  Relationship problems that have occurred or gotten worse since the pregnancy loss.  Signs of depression that last longer than 2 weeks. These may include: ? Sadness. ? Anxiety. ? Hopelessness. ? Loss of interest in activities you enjoy. ? Inability to concentrate. ? Trouble sleeping or sleeping too  much. ? Loss of appetite or overeating. ? Thoughts of death or of hurting yourself. Follow these instructions at home:  Take over-the-counter and prescription medicines only as told by your health care provider.  Rest at home until your energy level returns. Return to your normal activities as told by your health care provider. Ask your health care provider what activities are safe for you.  When you are ready, meet with your health care provider to discuss steps to take for a future pregnancy.  Keep all follow-up visits as told by your health care provider. This is important. Where to find support  To help you and your partner with the process of grieving, talk with your health care provider or seek counseling.  Consider meeting with others who have experienced pregnancy loss. Ask your health care provider about support groups and resources. Where to find more information  U.S. Department of Health and Programmer, systems on Women's Health: [place]  American Pregnancy Association: www.americanpregnancy.org Contact a health care provider if:  You continue to experience grief, sadness, or lack of motivation for everyday activities, and those feelings do not improve over time.  You are struggling to recover emotionally, especially if you are using alcohol or substances to help. Get help right away if:  You have thoughts of hurting yourself or others. If you ever feel like you may hurt yourself or others, or have thoughts about taking your own life, get help right away. You can go to your nearest emergency department or call:  Your local emergency services (911 in the [place]  A suicide crisis helpline, such as the [facility] at 602 266 4336. This is open 24 hours a day. Summary  Any pregnancy loss can be difficult physically and emotionally.  You may experience many different emotions while you grieve. Emotional recovery can last longer  than physical recovery.  It is normal to have emotional stress after a pregnancy loss. But emotional stress that lasts a long time or becomes severe requires treatment.  See your health care provider if you are struggling emotionally after a pregnancy loss. This information is not intended to replace advice given to you by your health care provider. Make sure you discuss any questions you have with your health care provider. Document Revised: 02/24/2019 Document Reviewed: 01/15/2018 Elsevier Patient Education  2020 [employer].

## 2020-04-13 NOTE — Anesthesia Preprocedure Evaluation (Addendum)
Anesthesia Evaluation  Patient identified by MRN, date of birth, ID band Patient awake    Reviewed: Allergy & Precautions, H&P , NPO status , Patient's Chart, lab work & pertinent test results, reviewed documented beta blocker date and time   Airway Mallampati: III  TM Distance: >3 FB Neck ROM: full    Dental  (+) Teeth Intact   Pulmonary neg pulmonary ROS,    breath sounds clear to auscultation       Cardiovascular negative cardio ROS   Rhythm:regular Rate:Normal     Neuro/Psych negative neurological ROS  negative psych ROS   GI/Hepatic Neg liver ROS, GERD  Medicated and Controlled,  Endo/Other  negative endocrine ROS  Renal/GU negative Renal ROS     Musculoskeletal   Abdominal   Peds  Hematology   Anesthesia Other Findings Past Medical History: 07/2018: Chlamydia     Comment:  treated 09/2018: Gonorrhea     Comment:  treated 12/15/2018: History of gonorrhea No date: Yeast vaginitis Past Surgical History: 10/2016: WISDOM TOOTH EXTRACTION     Comment:  all four;    Reproductive/Obstetrics negative OB ROS                            Anesthesia Physical Anesthesia Plan  ASA: II  Anesthesia Plan: General ETT and Modified Rapid Sequence   Post-op Pain Management:    Induction: Intravenous  PONV Risk Score and Plan: Ondansetron and Treatment may vary due to age or medical condition  Airway Management Planned: Oral ETT  Additional Equipment:   Intra-op Plan:   Post-operative Plan: Extubation in OR  Informed Consent: I have reviewed the patients History and Physical, chart, labs and discussed the procedure including the risks, benefits and alternatives for the proposed anesthesia with the patient or authorized representative who has indicated his/her understanding and acceptance.     Dental Advisory Given  Plan Discussed with: CRNA  Anesthesia Plan Comments:         Anesthesia Quick Evaluation

## 2020-04-13 NOTE — H&P (Signed)
UPDATE TO PREVIOUS HISTORY AND PHYSICAL  The patient has been seen and examined. She is scheduled for suction Dilation and Curettt H&P is up to date, no changes noted.  Patient can proceed to the OR for scheduled procedure.   Rubie Maid, MD 04/13/2020 11:17 PM

## 2020-04-14 ENCOUNTER — Telehealth: Payer: Self-pay | Admitting: Obstetrics and Gynecology

## 2020-04-14 MED ORDER — LIDOCAINE HCL 1 % IJ SOLN
INTRAMUSCULAR | Status: DC | PRN
  Administered 2020-04-13: 8 mL

## 2020-04-14 NOTE — Telephone Encounter (Signed)
Pt called no answer and no VM picked up. 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 Work note completed and sent to pt via mychart and also printed and placed at front desk for pick up.

## 2020-04-14 NOTE — Telephone Encounter (Signed)
Pt called in and stated that they need a  Letter to get out of work for a week. The pt stated it can be sent thur mychart. Please Samantha Ramsey

## 2020-04-18 LAB — SURGICAL PATHOLOGY

## 2020-04-20 ENCOUNTER — Ambulatory Visit (INDEPENDENT_AMBULATORY_CARE_PROVIDER_SITE_OTHER): Payer: Federal, State, Local not specified - PPO | Admitting: Obstetrics and Gynecology

## 2020-04-20 ENCOUNTER — Other Ambulatory Visit: Payer: Self-pay

## 2020-04-20 ENCOUNTER — Encounter: Payer: Self-pay | Admitting: Obstetrics and Gynecology

## 2020-04-20 VITALS — BP 123/84 | HR 77 | Ht 59.0 in | Wt 133.5 lb

## 2020-04-20 DIAGNOSIS — O039 Complete or unspecified spontaneous abortion without complication: Secondary | ICD-10-CM

## 2020-04-20 MED ORDER — NORETHIN ACE-ETH ESTRAD-FE 1-20 MG-MCG PO TABS
1.0000 | ORAL_TABLET | Freq: Every day | ORAL | 3 refills | Status: DC
Start: 2020-04-20 — End: 2020-09-29

## 2020-04-20 NOTE — Progress Notes (Signed)
Pt present for follow up from [facility] for miscarriage. Pt stated that she was doing well no problems.

## 2020-04-20 NOTE — Progress Notes (Signed)
    OBSTETRICS/GYNECOLOGY POST-OPERATIVE CLINIC VISIT  Subjective:     Samantha Ramsey is a 24 y.o. G35P0010 female who presents to the clinic 1 week status post suction D&C for missed abortion at [redacted] weeks gestation. Eating a regular diet without difficulty. Bowel movements are normal. The patient is not having any pain.  The following portions of the patient's history were reviewed and updated as appropriate: allergies, current medications, past family history, past medical history, past social history, past surgical history and problem list.  Review of Systems Pertinent items noted in HPI and remainder of comprehensive ROS otherwise negative.    Objective:    BP 123/84   Pulse 77   Ht 4\' 11"  (1.499 m)   Wt 133 lb 8 oz (60.6 kg)   LMP 01/23/2020   BMI 26.96 kg/m  General:  alert and no distress  Abdomen: soft, bowel sounds active, non-tender  Pelvis:   external genitalia normal, rectovaginal septum normal.  Vagina with small amount of blood in vaginal vault.  Cervix normal appearing, no lesions and no motion tenderness.  Uterus mobile, nontender, normal shape and size.  Adnexae non-palpable, nontender bilaterally.     Pathology:  Admission on 04/13/2020, Discharged on 04/13/2020   Performed at [facility], [address] [place], [place] [address]  . SURGICAL PATHOLOGY 04/13/2020    Final-Edited                   Value:Surgical Pathology CASE: [id number] PATIENT: Samantha Ramsey Surgical Pathology Report     Specimen Submitted: A. Products of conception  Clinical History: Missed abortion.      DIAGNOSIS: A.  PRODUCTS OF CONCEPTION; DILATATION AND EVACUATION: - DECIDUA, CHORIONIC VILLI, AND EMBRYONIC TISSUE, CONSISTENT WITH PRODUCTS OF CONCEPTION.  GROSS DESCRIPTION: A. Labeled: Products of conception Received: In formalin Tissue fragment(s): Multiple Size: 8.5 x 4.5 x 1.5 cm Villous tissue: Identified Fetal tissue: Not identified Comment:  Multiple fragments of pink-red soft tissue and blood clot  Block summary: 1 -representative sections   Final Diagnosis performed by Quay Burow, MD.   Electronically signed 04/18/2020 2:18:31PM The electronic signature indicates that the named Attending Pathologist has evaluated the specimen Technical component performed at [facility], [address] [place], [place] [address] Lab: [phone] Dir: Rush Farmer, MD, MMM  Profess                         ional component performed at [facility], [facility], [address], [place], [place] [address] Lab: [phone] Dir: Dellia Nims. Reuel Derby, MD      Assessment:    Doing well postoperatively. S/p miscarriage managed surgically.    Plan:   1. Continue any current medications as needed.  Discussion had on plans for the future (delaying childbearing vs actively attempting to conceive again soon). Desires to delay child bearing.  Would like to return back to OCPs. Will prescribe, to begin on June 13th. Use back up method x 1 month.  2. Continue pelvic rest x 1 additional week.  3. Operative findings reviewed. Pathology report discussed. 4. Activity restrictions: none 5. Anticipated return to work: now. 6. Follow up: as needed. Patient notes she is planning on relocating to [place] for a job next month.      Rubie Maid, MD [facility]

## 2020-06-16 ENCOUNTER — Telehealth: Payer: Self-pay | Admitting: Obstetrics and Gynecology

## 2020-06-16 NOTE — Telephone Encounter (Signed)
Patients Aunt called in stating that her niece Samantha Ramsey) is passing vaginal blood clots for a week and that she is having some swelling/numbness in her left leg. Ms. Nyoka Cowden was wanting to see if her niece could come in today, informed her that her provider didn't have any openings and that she would need to contact her PCP or go to the ED if her symptoms were severe. I stated that I would send a message to her provider anyway's to see what they could do for patient. Informed patients Aunt that she is not on the patient's DPR and that we would not be able to discuss anything regarding the patient with her.

## 2020-06-16 NOTE — Telephone Encounter (Signed)
Spoke to pt concerning her irregular bleeding. Pt stated that she has noticed heavy bleeding every 2 weeks with a lot of clots and changing her pad every 2-3 hours. Pt stated that she would go to Urgent Care to be seen.

## 2020-06-27 ENCOUNTER — Encounter: Payer: Self-pay | Admitting: Obstetrics and Gynecology

## 2020-06-27 ENCOUNTER — Ambulatory Visit (INDEPENDENT_AMBULATORY_CARE_PROVIDER_SITE_OTHER): Payer: Federal, State, Local not specified - PPO | Admitting: Obstetrics and Gynecology

## 2020-06-27 DIAGNOSIS — Z8759 Personal history of other complications of pregnancy, childbirth and the puerperium: Secondary | ICD-10-CM

## 2020-06-27 DIAGNOSIS — N926 Irregular menstruation, unspecified: Secondary | ICD-10-CM | POA: Diagnosis not present

## 2020-06-27 NOTE — Progress Notes (Signed)
TELEVISIT-Pt called and transferred from front office. Pt c/o of irregular cycles. Pt currently not taking birth control.

## 2020-06-27 NOTE — Progress Notes (Signed)
Virtual Visit via Telephone Note  I connected with Samantha Ramsey on 06/27/20 at 11:00 AM EDT by telephone and verified that I am speaking with the correct person using two identifiers.  Patient Location: Out of town [facility]) Provider Location: Office  I discussed the limitations, risks, security and privacy concerns of performing an evaluation and management service by telephone and the availability of in person appointments. I also discussed with the patient that there may be a patient responsible charge related to this service. The patient expressed understanding and agreed to proceed.   History of Present Illness: Patient complains of a cycle every 2 weeks since her miscarriage in June. Notes it starts off like spotting, then becomes heavy for 1-2 days, then slows down again.  Has not started on her contraception that was prescribed (OCPs). She is currently sexually active, not using any barrier method. Notes taking a pregnancy test ~ 2 weeks ago which was negative, but has had unprotected sex since then.    Observations/Objective: Last menstrual period 06/15/2020. Patient has not recently weighed herself.    Assessment:   Irregular menses H/o miscarriage (s/p D&C)  Plan: Discussed that irregular bleeding may be secondary to her history of miscarriage and persistent fluctuation of hormones. Encouraged patient to initiate her OCPs. As previously prescribed. Advised on taking a pregnancy test prior to initiation of pills.  Notes she will not return from [place] until 8/21.  Will pick up prescription for pills then.  Can take for 1-2 months to regulate cycles, and then can decide to continue or discontinue use.   Follow Up Instructions:  I discussed the assessment and treatment plan with the patient. The patient was provided an opportunity to ask questions and all were answered. The patient agreed with the plan and demonstrated an understanding of the instructions.    The patient was  advised to call back or seek an in-person evaluation if the symptoms worsen or if the condition fails to improve as anticipated.  I provided 8 minutes of non-face-to-face time during this encounter.   Rubie Maid, MD  [facility]

## 2020-06-27 NOTE — Patient Instructions (Signed)
Dysfunctional Uterine Bleeding Dysfunctional uterine bleeding is abnormal bleeding from the uterus. Dysfunctional uterine bleeding includes:  A menstrual period that comes earlier or later than usual.  A menstrual period that is lighter or heavier than usual, or has large blood clots.  Vaginal bleeding between menstrual periods.  Skipping one or more menstrual periods.  Vaginal bleeding after sex.  Vaginal bleeding after menopause. Follow these instructions at home: Eating and drinking   Eat well-balanced meals. Include foods that are high in iron, such as liver, meat, shellfish, green leafy vegetables, and eggs.  To prevent or treat constipation, your health care provider may recommend that you: ? Drink enough fluid to keep your urine pale yellow. ? Take over-the-counter or prescription medicines. ? Eat foods that are high in fiber, such as beans, whole grains, and fresh fruits and vegetables. ? Limit foods that are high in fat and processed sugars, such as fried or sweet foods. Medicines  Take over-the-counter and prescription medicines only as told by your health care provider.  Do not change medicines without talking with your health care provider.  Aspirin or medicines that contain aspirin may make the bleeding worse. Do not take those medicines: ? During the week before your menstrual period. ? During your menstrual period.  If you were prescribed iron pills, take them as told by your health care provider. Iron pills help to replace iron that your body loses because of this condition. Activity  If you need to change your sanitary pad or tampon more than one time every 2 hours: ? Lie in bed with your feet raised (elevated). ? Place a cold pack on your lower abdomen. ? Rest as much as possible until the bleeding stops or slows down.  Do not try to lose weight until the bleeding has stopped and your blood iron level is back to normal. General instructions   For two  months, write down: ? When your menstrual period starts. ? When your menstrual period ends. ? When any abnormal vaginal bleeding occurs. ? What problems you notice.  Keep all follow up visits as told by your health care provider. This is important. Contact a health care provider if you:  Feel light-headed or weak.  Have nausea and vomiting.  Cannot eat or drink without vomiting.  Feel dizzy or have diarrhea while you are taking medicines.  Are taking birth control pills or hormones, and you want to change them or stop taking them. Get help right away if:  You develop a fever or chills.  You need to change your sanitary pad or tampon more than one time per hour.  Your vaginal bleeding becomes heavier, or your flow contains clots more often.  You develop pain in your abdomen.  You lose consciousness.  You develop a rash. Summary  Dysfunctional uterine bleeding is abnormal bleeding from the uterus.  It includes menstrual bleeding of abnormal duration, volume, or regularity.  Bleeding after sex and after menopause are also considered dysfunctional uterine bleeding. This information is not intended to replace advice given to you by your health care provider. Make sure you discuss any questions you have with your health care provider. Document Revised: 04/15/2018 Document Reviewed: 04/15/2018 Elsevier Patient Education  2020 Elsevier Inc.  

## 2020-09-27 ENCOUNTER — Telehealth: Payer: Self-pay

## 2020-09-27 NOTE — Telephone Encounter (Signed)
Spoke with patient and scheduled her an appointment with Dr. Ancil Boozer for 09/29/20 @ 10 AM.  I also informed patient that since we do not have a DRP on file we were unable to speak with her mother.  Patient verbalized understanding.

## 2020-09-27 NOTE — Telephone Encounter (Signed)
Copied from [place] [id number]. Topic: Quick Communication - See Telephone Encounter >> Sep 27, 2020  3:41 PM Loma [place] wrote: CRM for notification. See Telephone encounter for: 09/27/20.Please call pt mother Drema Balzarine, states that pt tested positive on Oct 26 for covid and also at an unspecfied date, states that ithen an addition alanother test in [place] while traveling and it was negative so she is now back in school. She is wanting to make an appointment as she can not wear her mask in school as she can not breathe with it on. Since it as been only 14 days since testing the first time, office staff said to forward [place] for a dr decision. Mother wants CB at [phone] in regards to her lungs being checked by Dr Ancil Boozer.

## 2020-09-27 NOTE — Telephone Encounter (Signed)
Copied from [place] [id number]. Topic: Quick Communication - See Telephone Encounter >> Sep 27, 2020  3:41 PM Loma Boston wrote: CRM for notification. See Telephone encounter for: 09/27/20.Please call pt mother Drema Balzarine, states that pt tested positive on Oct 26 for covid and also at an unspecfied date, states that ithen an addition alanother test in [place] while traveling and it was negative so she is now back in school. She is wanting to make an appointment as she can not wear her mask in school as she can not breathe with it on. Since it as been only 14 days since testing the first time, office staff said to forward [place] for a dr decision. Mother wants CB at [phone] in regards to her lungs being checked by Dr Ancil Boozer.

## 2020-09-28 NOTE — Progress Notes (Signed)
Name: Samantha Ramsey   MRN: [id number]    DOB: 09-12-1996   Date:09/29/2020       Progress Note  Subjective  Chief Complaint  Post Covid check-up  HPI  SOB: she lives in [place] and is 7 weeks pregnancy . On October 27 th she developed a headache and fever and was sent home from work, she took a nap and when she woke up she could not smell, she took a home test for COVID-19 and it was positive, she got progressively worse with cough, fatigue, body aches, fever , dizziness and vomiting so she went to urgent care on 09/15/2020 and from there sent to [facility] because they could not get her pulse ox. At [facility] she had a pelvic US and pregnancy was viable, CBC and comp panel that were unremarkable and she was sent home without any therapy because of lack of research on pregnant patient with COVID-19. She saw Dr. Marcelline Mates today - OB here in town and had repeat US and fetus is fine. She states she is feeling better since last week, but continues to have SOB with activity. No longer has fever, or chills, nausea, vomiting, she can taste and smell again. No wheezing or cough. She states sometimes has a sharp pain on substernal area , no tightness or pressure   Patient Active Problem List   Diagnosis Date Noted  . History of miscarriage 06/27/2020  . Human herpes simplex virus type 1 (HSV-1) DNA detected 12/03/2019    Past Surgical History:  Procedure Laterality Date  . DILATION AND EVACUATION N/A 04/13/2020   Procedure: DILATATION AND EVACUATION;  Surgeon: Rubie Maid, MD;  Location: [facility] ORS;  Service: Gynecology;  Laterality: N/A;  . WISDOM TOOTH EXTRACTION  10/2016   all four;     Family History  Problem Relation Age of Onset  . Hypertension Mother   . Healthy Father   . Cervical cancer Maternal Grandmother   . Ovarian cancer Maternal Grandmother   . Breast cancer Neg Hx   . Colon cancer Neg Hx     Social History   Tobacco Use  . Smoking status: Never Smoker  . Smokeless tobacco: Never Used   Substance Use Topics  . Alcohol use: Not Currently    Alcohol/week: 1.0 standard drink    Types: 1 Standard drinks or equivalent per week    Comment: occass     Current Outpatient Medications:  .  Doxylamine-Pyridoxine (DICLEGIS) 10-10 MG TBEC, Take 2 tablets by mouth at bedtime. If symptoms persist, add one tablet in the morning and one in the afternoon, Disp: 100 tablet, Rfl: 5 .  ondansetron (ZOFRAN) 4 MG tablet, Take 1 tablet (4 mg total) by mouth every 8 (eight) hours as needed for nausea or vomiting., Disp: 20 tablet, Rfl: 0 .  Prenatal Vit-Fe Fumarate-FA (PRENATAL VITAMINS PO), Take by mouth. , Disp: , Rfl:   Allergies  Allergen Reactions  . Flagyl [Metronidazole] Nausea And Vomiting    I personally reviewed active problem list, medication list, allergies, family history, social history, health maintenance, notes from last encounter with the patient/caregiver today.   ROS  Constitutional: Negative for fever or weight change.  Respiratory: Negative for cough but positive for shortness of breath.   Cardiovascular: Negative for chest pain or palpitations.  Gastrointestinal: Negative for abdominal pain, no bowel changes.  Musculoskeletal: Negative for gait problem or joint swelling.  Skin: Negative for rash.  Neurological: Negative for dizziness or headache.  No other specific complaints  in a complete review of systems (except as listed in HPI above).  Objective  Vitals:   09/29/20 1001  BP: 124/82  Pulse: 99  Resp: 17  Temp: 98.2 F (36.8 C)  TempSrc: Oral  SpO2: 100%  Weight: 145 lb (65.8 kg)  Height: 4\' 11"  (1.499 m)    Body mass index is 29.29 kg/m.  Physical Exam  Constitutional: Patient appears well-developed and well-nourished. Overweight.  No distress.  HEENT: head atraumatic, normocephalic, pupils equal and reactive to light, neck supple Cardiovascular: Normal rate, regular rhythm and normal heart sounds.  No murmur heard. No BLE  edema. Pulmonary/Chest: Effort normal and breath sounds normal. No respiratory distress. Abdominal: Soft.  There is no tenderness. Psychiatric: Patient has a normal mood and affect. behavior is normal. Judgment and thought content normal.  Recent Results (from the past 2160 hour(s))  POCT urine pregnancy     Status: Abnormal   Collection Time: 09/29/20  8:50 AM  Result Value Ref Range   Preg Test, Ur Positive (A) Negative     PHQ2/9: Depression screen [facility] 2/9 09/29/2020 12/15/2018  Decreased Interest 0 0  Down, Depressed, Hopeless 0 0  PHQ - 2 Score 0 0    phq 9 is negative   Fall Risk: Fall Risk  09/29/2020 12/15/2018  Falls in the past year? 0 0  Number falls in past yr: 0 0  Injury with Fall? 0 0     Functional Status Survey: Is the patient deaf or have difficulty hearing?: No Does the patient have difficulty seeing, even when wearing glasses/contacts?: No Does the patient have difficulty concentrating, remembering, or making decisions?: No Does the patient have difficulty walking or climbing stairs?: No Does the patient have difficulty dressing or bathing?: No Does the patient have difficulty doing errands alone such as visiting a doctor's office or shopping?: No    Assessment & Plan  1. SOB (shortness of breath) on exertion  - DG Chest 2 View; Future - albuterol (VENTOLIN HFA) 108 (90 Base) MCG/ACT inhaler; Inhale 2 puffs into the lungs every 6 (six) hours as needed for wheezing or shortness of breath.  Dispense: 8 g; Refill: 0  Try Ventolin to see if improves symptoms and if not better go for CXR next week   2. Less than [redacted] weeks gestation of pregnancy   3. History of COVID-19  - DG Chest 2 View; Future

## 2020-09-28 NOTE — Progress Notes (Signed)
Pt present for pregnancy confirmation. Pt's LMP 08/05/2020.  UPT- Positive. Pt stated that she was doing well and denies any issues at this time.

## 2020-09-29 ENCOUNTER — Encounter: Payer: Self-pay | Admitting: Family Medicine

## 2020-09-29 ENCOUNTER — Ambulatory Visit: Payer: Federal, State, Local not specified - PPO | Admitting: Family Medicine

## 2020-09-29 ENCOUNTER — Ambulatory Visit (INDEPENDENT_AMBULATORY_CARE_PROVIDER_SITE_OTHER): Payer: Federal, State, Local not specified - PPO | Admitting: Obstetrics and Gynecology

## 2020-09-29 ENCOUNTER — Other Ambulatory Visit: Payer: Self-pay

## 2020-09-29 ENCOUNTER — Encounter: Payer: Self-pay | Admitting: Obstetrics and Gynecology

## 2020-09-29 VITALS — BP 124/82 | HR 99 | Temp 98.2°F | Resp 17 | Ht 59.0 in | Wt 145.0 lb

## 2020-09-29 VITALS — BP 117/77 | HR 90 | Ht 59.0 in | Wt 145.0 lb

## 2020-09-29 DIAGNOSIS — Z8616 Personal history of COVID-19: Secondary | ICD-10-CM | POA: Diagnosis not present

## 2020-09-29 DIAGNOSIS — R0602 Shortness of breath: Secondary | ICD-10-CM

## 2020-09-29 DIAGNOSIS — Z3A01 Less than 8 weeks gestation of pregnancy: Secondary | ICD-10-CM | POA: Diagnosis not present

## 2020-09-29 DIAGNOSIS — N926 Irregular menstruation, unspecified: Secondary | ICD-10-CM

## 2020-09-29 DIAGNOSIS — O219 Vomiting of pregnancy, unspecified: Secondary | ICD-10-CM

## 2020-09-29 DIAGNOSIS — O09291 Supervision of pregnancy with other poor reproductive or obstetric history, first trimester: Secondary | ICD-10-CM

## 2020-09-29 LAB — POCT URINE PREGNANCY: Preg Test, Ur: POSITIVE — AB

## 2020-09-29 MED ORDER — ONDANSETRON HCL 4 MG PO TABS: 4.0000 mg | ORAL_TABLET | Freq: Three times a day (TID) | ORAL | 0 refills | Status: DC | PRN

## 2020-09-29 MED ORDER — DOXYLAMINE-PYRIDOXINE 10-10 MG PO TBEC: 2.0000 | DELAYED_RELEASE_TABLET | Freq: Every day | ORAL | 5 refills | Status: DC

## 2020-09-29 MED ORDER — ALBUTEROL SULFATE HFA 108 (90 BASE) MCG/ACT IN AERS: 2.0000 | INHALATION_SPRAY | Freq: Four times a day (QID) | RESPIRATORY_TRACT | 0 refills | Status: DC | PRN

## 2020-09-29 NOTE — Patient Instructions (Signed)

## 2020-09-29 NOTE — Progress Notes (Signed)
GYNECOLOGY CLINIC PROGRESS NOTE Subjective:    Samantha Ramsey is a 24 y.o. G20P0010 female who presents for evaluation of amenorrhea. She believes she could be pregnant. Pregnancy is desired. Sexual Activity: single partner, contraception: none. Current symptoms also include: breast tenderness, morning sickness, nausea and positive home pregnancy test. Last period was normal, 08/05/2020. She does have a previous history of miscarriage x 1 earlier this year in May at [redacted] weeks gestation, treated surgically with D&C.   Of note, patient recently diagnosed with COVID-19 infection ~ on 09/15/2020 (presented to Urgent Care with complaints of dizziness, body aches, cough, fatigue, and vomiting). Notes she is doing well, no lingering symptoms at this time.   The following portions of the patient's history were reviewed and updated as appropriate: allergies, current medications, past family history, past medical history, past social history, past surgical history and problem list.  Review of Systems Pertinent items noted in HPI and remainder of comprehensive ROS otherwise negative.     Objective:    BP 117/77   Pulse 90   Ht 4\' 11"  (1.499 m)   Wt 145 lb (65.8 kg)   LMP 08/05/2020   BMI 29.29 kg/m  General: alert, no distress and no acute distress    Lab Review Urine HCG: positive   09/15/2020: HCG quantitative level 17644.    Imaging Review (Outside facility):  Procedure(s): [place] PREGNANCY 1ST TRIMESTER W ENDOVAG  Date of service: 09/15/2020 6:20 PM   Provided clinical information: 89 years, Female, "Pregnant, cervix or  uterus assessment"  Procedure and materials: Transabdominal and transvaginal pelvic  ultrasound was performed.  Comparison studies: None.   Observations:   There is a 1.17 cm gestational sac within the endometrial cavity.  No fetal pole was visualized, but this is not unusual this early in  pregnancy.  Yolk sac: Present.   Perigestational hemorrhage: No  significant perigestational  hemorrhage.   Uterus: Normal. No significant leiomyomata identified.   Cervix: Normal and closed.  Maternal ovaries and adnexa: Normal echotexture and appearance with  normal vascular flow. No adnexal masses are seen.  Pelvic free fluid: None.    Bedside unofficial sono (performed by me today):  - Gestational sac with SIUP noted. CRL 10.44 mm, EGA 7.0 weeks, with EDD 05/18/2021. FHR 163 bpm.   Assessment:   1. Missed menses   2. History of COVID-19   3. History of miscarriage, currently pregnant, first trimester   4. Nausea/vomiting in pregnancy     Plan:   1. Missed Menses -  Pregnancy Test: Positive: Briefly discussed pre-natal care options.  Encouraged well-balanced diet, plenty of rest when needed, pre-natal vitamins daily and walking for exercise. Discussed self-help for nausea, avoiding OTC medications until consulting provider or pharmacist, other than Tylenol as needed, minimal caffeine (1-2 cups daily) and avoiding alcohol. She will schedule her initial OB visit in the next month.  Feel free to call with any questions. Patient currently resides in [place], but frequently returns to [facility].  Asked if she will initiate care in [place] or f/u in [place] for her visits. She states that she return to [facility] for her visits.  2. History of COVID-19 infection - Patient currently denies any residual symptoms of COVID. SOB and cough have resolved. Would still encourage vaccination later in the pregnancy if she has not already received.  3. History of miscarriage  - Patient with missed AB at [redacted] weeks gestation in May. Most recent ultrasound noting gestational sac but  no fetal pole at last ultrasound on 10/29.  Bedside sono today noting viable IUP at 7.0 weeks, FHR 163 bpm. Patient to f/u in 2 weeks for official sono and NOB intake visit.  4. Nausea and vomiting  - Patient reports taking some nausea gummies that offer some relief. Will prescribe Diclegis, and  also can prescribe Zofran for more severe nausea/vomiting.     A total of 25 minutes were spent on today's visit, with over half of that time spent face-to-face with the patient during this encounter in addition to review of previous records, counseling,  and coordination of care.   Rubie Maid, MD [facility]

## 2020-10-18 LAB — OB RESULTS CONSOLE HIV ANTIBODY (ROUTINE TESTING): HIV: NONREACTIVE

## 2020-10-19 ENCOUNTER — Other Ambulatory Visit: Payer: Federal, State, Local not specified - PPO

## 2020-10-20 LAB — OB RESULTS CONSOLE RPR: RPR: NONREACTIVE

## 2020-11-18 NOTE — L&D Delivery Note (Signed)
Delivery Summary for Samantha Ramsey  Labor Events:   Preterm labor: No data found  Rupture date: 05/15/2021  Rupture time: 12:00 PM  Rupture type: Spontaneous  Fluid Color: Clear  Induction: No data found  Augmentation: No data found  Complications: No data found  Cervical ripening: No data found No data found   No data found     Delivery:   Episiotomy: No data found  Lacerations: No data found  Repair suture: No data found  Repair # of packets: No data found  Blood loss (ml): 150   Information for the patient's newborn:  Javia, Dillow [[id number]]   Delivery 05/15/2021 9:36 PM by  Vaginal, Spontaneous Sex:  female Gestational Age: [redacted]w[redacted]d Delivery Clinician:   Living?:         APGARS  One minute Five minutes Ten minutes  Skin color:        Heart rate:        Grimace:        Muscle tone:        Breathing:        Totals: 8  9      Presentation/position:      Resuscitation:   Cord information:    Disposition of cord blood:     Blood gases sent?  Complications:   Placenta: Delivered:       appearance Newborn Measurements: Weight: 7 lb 2.6 oz (3250 g)  Height: 20.47"  Head circumference:    Chest circumference:    Other providers:    Additional  information: Forceps:   Vacuum:   Breech:   Observed anomalies      Delivery Note At 9:36 PM a viable and healthy female "Akai" was delivered via Vaginal, Spontaneous (Presentation: Vertex; Left Occiput Anterior).  APGAR: 8, 9; weight  3250 grams.   Placenta status: Spontaneous, Intact.  Cord: 3 vessels with the following complications: None. Nuchal cord x 1 reduced after delivery of fetal body. Cord pH: not obtained.  Delayed cord clamping performed until cessation of pulsation.  Anesthesia: Epidural Episiotomy: None Lacerations:  Labial abrasion, hemostatic Suture Repair:  N/A Est. Blood Loss (mL): 150  Mom to postpartum.  Baby to Couplet care / Skin to Skin.  Samantha Ramsey 05/15/2021, 10:16 PM

## 2020-12-11 ENCOUNTER — Encounter: Payer: Federal, State, Local not specified - PPO | Admitting: Family Medicine

## 2020-12-12 ENCOUNTER — Encounter: Payer: Federal, State, Local not specified - PPO | Admitting: Obstetrics and Gynecology

## 2020-12-22 ENCOUNTER — Ambulatory Visit: Payer: Federal, State, Local not specified - PPO | Admitting: Family Medicine

## 2021-01-08 DIAGNOSIS — Z3492 Encounter for supervision of normal pregnancy, unspecified, second trimester: Secondary | ICD-10-CM | POA: Insufficient documentation

## 2021-01-23 ENCOUNTER — Other Ambulatory Visit: Payer: Self-pay

## 2021-01-23 ENCOUNTER — Ambulatory Visit (INDEPENDENT_AMBULATORY_CARE_PROVIDER_SITE_OTHER): Payer: Federal, State, Local not specified - PPO | Admitting: Obstetrics and Gynecology

## 2021-01-23 ENCOUNTER — Encounter: Payer: Self-pay | Admitting: Obstetrics and Gynecology

## 2021-01-23 VITALS — BP 115/79 | HR 112 | Wt 164.2 lb

## 2021-01-23 DIAGNOSIS — Z3A24 24 weeks gestation of pregnancy: Secondary | ICD-10-CM

## 2021-01-23 DIAGNOSIS — Z8616 Personal history of COVID-19: Secondary | ICD-10-CM

## 2021-01-23 DIAGNOSIS — B009 Herpesviral infection, unspecified: Secondary | ICD-10-CM

## 2021-01-23 DIAGNOSIS — Z3482 Encounter for supervision of other normal pregnancy, second trimester: Secondary | ICD-10-CM

## 2021-01-23 DIAGNOSIS — O09292 Supervision of pregnancy with other poor reproductive or obstetric history, second trimester: Secondary | ICD-10-CM

## 2021-01-23 LAB — POCT URINALYSIS DIPSTICK OB
Blood, UA: NEGATIVE
Ketones, UA: NEGATIVE
Nitrite, UA: NEGATIVE
Spec Grav, UA: 1.025 (ref 1.010–1.025)
Urobilinogen, UA: 0.2 E.U./dL
pH, UA: 7 (ref 5.0–8.0)

## 2021-01-23 NOTE — Progress Notes (Signed)
TRANSFER IN OB HISTORY AND PHYSICAL  SUBJECTIVE:       Samantha Ramsey is a 25 y.o. G2P0010 female at [redacted]w[redacted]d, Estimated Date of Delivery: 05/10/21, by last menstrual period 08/05/2020, consistent with 12 week ultrasound. Pregnancy was unplanned. She presents today for Transition of Prenatal Care. EPIC data migration from outside records is accomplished today.  She was previously a patient of [facility], but relocated towards the end of the year (after establishment of the pregnancy) to [place] due to her job. Now has returned as her job allows for her to work remotely, and she would prefer to have her baby in [place] where she she previously resided. Reports that pregnancy has been uneventful thus far.  She does have a history of prior miscarriage (at [redacted] weeks gestation) in May 2021.   Complaints today include: None      Gynecologic History Patient's last menstrual period was 08/05/2020. Normal Contraception: none Last Pap: 10/31/2020. Results were: abnormal, ASCUS, no HPV typing.  H/o prior abnormal pap 12/01/2019, LGSIL. History of STI's: HSV-1 (serology only), no history of lesions. H/o gonorrhea and chlamydia.   Obstetric History OB History  Gravida Para Term Preterm AB Living  2 0 0 0 1 0  SAB IAB Ectopic Multiple Live Births  1 0 0 0 0    # Outcome Date GA Lbr Len/2nd Weight Sex Delivery Anes PTL Lv  2 Current           1 SAB 03/2020 [redacted]w[redacted]d       FD    Past Medical History:  Diagnosis Date   Chlamydia 07/2018   treated   Gonorrhea    12/15/2018, 09/2018.  Treated   HSV-1 (herpes simplex virus 1) infection    Serology postiive only. Has never had outbreak   Yeast vaginitis     Past Surgical History:  Procedure Laterality Date   DILATION AND EVACUATION N/A 04/13/2020   Procedure: DILATATION AND EVACUATION;  Surgeon: Rubie Maid, MD;  Location: [facility] ORS;  Service: Gynecology;  Laterality: N/A;   WISDOM TOOTH EXTRACTION  10/2016   all four;     Current  Outpatient Medications on File Prior to Visit  Medication Sig Dispense Refill   albuterol (VENTOLIN HFA) 108 (90 Base) MCG/ACT inhaler Inhale 2 puffs into the lungs every 6 (six) hours as needed for wheezing or shortness of breath. 8 g 0   Doxylamine-Pyridoxine (DICLEGIS) 10-10 MG TBEC Take 2 tablets by mouth at bedtime. If symptoms persist, add one tablet in the morning and one in the afternoon 100 tablet 5   ondansetron (ZOFRAN) 4 MG tablet Take 1 tablet (4 mg total) by mouth every 8 (eight) hours as needed for nausea or vomiting. 20 tablet 0   Prenatal Vit-Fe Fumarate-FA (PRENATAL VITAMINS PO) Take by mouth.      No current facility-administered medications on file prior to visit.    Allergies  Allergen Reactions   Flagyl [Metronidazole] Nausea And Vomiting    Social History   Socioeconomic History   Marital status: Single    Spouse name: Not on file   Number of children: 0   Years of education: Not on file   Highest education level: Some college, no degree  Occupational History   Occupation: full time Ship broker    Occupation: works part time     Comment: Therapist, art as a host   Tobacco Use   Smoking status: Never Smoker   Smokeless tobacco: Never Used  Media planner  Vaping Use: Never used  Substance and Sexual Activity   Alcohol use: Not Currently    Alcohol/week: 1.0 standard drink    Types: 1 Standard drinks or equivalent per week    Comment: occass   Drug use: No   Sexual activity: Yes    Partners: Male  Other Topics Concern   Not on file  Social History Narrative   She is going [employer] and is getting a degree in Designer, fashion/clothing    Social Determinants of Radio broadcast assistant Strain: Not on file  Food Insecurity: Not on file  Transportation Needs: Not on file  Physical Activity: Not on file  Stress: Not on file  Social Connections: Not on file  Intimate Partner Violence: Not on file    Family History  Problem Relation Age of  Onset   Hypertension Mother    Healthy Father    Cervical cancer Maternal Grandmother    Ovarian cancer Maternal Grandmother    Breast cancer Neg Hx    Colon cancer Neg Hx     The following portions of the patient's history were reviewed and updated as appropriate: allergies, current medications, past OB history, past medical history, past surgical history, past family history, past social history, and problem list.    OBJECTIVE: Initial Physical Exam (New OB)  GENERAL APPEARANCE: alert, well appearing, in no apparent distress HEAD: normocephalic, atraumatic LUNGS: clear to auscultation, no wheezes, rales or rhonchi, symmetric air entry HEART: regular rate and rhythm, no murmurs ABDOMEN: soft, nontender, nondistended, no abnormal masses, no epigastric pain. FHT 156 bpm. FH 25 cm.  EXTREMITIES: no redness or tenderness in the calves or thighs SKIN: normal coloration and turgor, no rashes NEUROLOGIC: alert, oriented, normal speech, no focal findings or movement disorder noted  PELVIC EXAM:  Exam deferred. Reviewed exam from previous GYN in records, normal.  ASSESSMENT: Normal pregnancy History of miscarraige History of HSV-1 H/o COVID infection  PLAN: - Continue routine prenatal care. For 28 week labs next visit. See orders - Patient declined genetic testing.  - Patient declined flu vaccine.  - Will need HSV prophylaxis at [redacted] weeks gestation.  - Has questions regarding other vaccines given in pregnancy and for her newborn. - - Notes that FOB does not desire for patient or fetus to receive any vaccinations. - Discussed reasons for the vaccines, advised that she did have the right to decline if desired. Also discussed purpose and benefits of vaccination.  - Reviewed records, normal labs. Anatomy scan normal with excepetion of accessory lobe of placenta (small, on the right).  - Follow up in 4 weeks.      Rubie Maid, MD [facility]

## 2021-01-23 NOTE — Patient Instructions (Addendum)
WHAT OB PATIENTS CAN EXPECT   Confirmation of pregnancy and ultrasound ordered if medically indicated-[redacted] weeks gestation  New OB (NOB) intake with nurse and New OB (NOB) labs- [redacted] weeks gestation  New OB (NOB) physical examination with provider- 11/[redacted] weeks gestation  Flu vaccine-[redacted] weeks gestation  Anatomy scan-[redacted] weeks gestation  Glucose tolerance test, blood work to test for anemia, T-dap vaccine-[redacted] weeks gestation  Vaginal swabs/cultures-STD/Group B strep-[redacted] weeks gestation  Appointments every 4 weeks until 28 weeks  Every 2 weeks from 28 weeks until 36 weeks  Weekly visits from 36 weeks until delivery  Second Trimester of Pregnancy  The second trimester of pregnancy is from week 13 through week 27. This is also called months 4 through 6 of pregnancy. This is often the time when you feel your best. During the second trimester:  Morning sickness is less or has stopped.  You may have more energy.  You may feel hungry more often. At this time, your unborn baby (fetus) is growing very fast. At the end of the sixth month, the unborn baby may be up to 12 inches long and weigh about 1 pounds. You will likely start to feel the baby move between 16 and 20 weeks of pregnancy. Body changes during your second trimester Your body continues to go through many changes during this time. The changes vary and generally return to normal after the baby is born. Physical changes  You will gain more weight.  You may start to get stretch marks on your hips, belly (abdomen), and breasts.  Your breasts will grow and may hurt.  Dark spots or blotches may develop on your face.  A dark line from your belly button to the pubic area (linea nigra) may appear.  You may have changes in your hair. Health changes  You may have headaches.  You may have heartburn.  You may have trouble pooping (constipation).  You may have hemorrhoids or swollen, bulging veins (varicose veins).  Your gums may  bleed.  You may pee (urinate) more often.  You may have back pain. Follow these instructions at home: Medicines  Take over-the-counter and prescription medicines only as told by your doctor. Some medicines are not safe during pregnancy.  Take a prenatal vitamin that contains at least 600 micrograms (mcg) of folic acid. Eating and drinking  Eat healthy meals that include: ? Fresh fruits and vegetables. ? Whole grains. ? Good sources of protein, such as meat, eggs, or tofu. ? Low-fat dairy products.  Avoid raw meat and unpasteurized juice, milk, and cheese.  You may need to take these actions to prevent or treat trouble pooping: ? Drink enough fluids to keep your pee (urine) pale yellow. ? Eat foods that are high in fiber. These include beans, whole grains, and fresh fruits and vegetables. ? Limit foods that are high in fat and sugar. These include fried or sweet foods. Activity  Exercise only as told by your doctor. Most people can do their usual exercise during pregnancy. Try to exercise for 30 minutes at least 5 days a week.  Stop exercising if you have pain or cramps in your belly or lower back.  Do not exercise if it is too hot or too humid, or if you are in a place of great height (high altitude).  Avoid heavy lifting.  If you choose to, you may have sex unless your doctor tells you not to. Relieving pain and discomfort  Wear a good support bra if your breasts   are sore.  Take warm water baths (sitz baths) to soothe pain or discomfort caused by hemorrhoids. Use hemorrhoid cream if your doctor approves.  Rest with your legs raised (elevated) if you have leg cramps or low back pain.  If you develop bulging veins in your legs: ? Wear support hose as told by your doctor. ? Raise your feet for 15 minutes, 3-4 times a day. ? Limit salt in your food. Safety  Wear your seat belt at all times when you are in a car.  Talk with your doctor if someone is hurting you or  yelling at you a lot. Lifestyle  Do not use hot tubs, steam rooms, or saunas.  Do not douche. Do not use tampons or scented sanitary pads.  Avoid cat litter boxes and soil used by cats. These carry germs that can harm your baby and can cause a loss of your baby by miscarriage or stillbirth.  Do not use herbal medicines, illegal drugs, or medicines that are not approved by your doctor. Do not drink alcohol.  Do not smoke or use any products that contain nicotine or tobacco. If you need help quitting, ask your doctor. General instructions  Keep all follow-up visits. This is important.  Ask your doctor about local prenatal classes.  Ask your doctor about the right foods to eat or for help finding a counselor. Where to find more information  American Pregnancy Association: americanpregnancy.org  American College of Obstetricians and Gynecologists: www.acog.org  Office on Women's Health: womenshealth.gov/pregnancy Contact a doctor if:  You have a headache that does not go away when you take medicine.  You have changes in how you see, or you see spots in front of your eyes.  You have mild cramps, pressure, or pain in your lower belly.  You continue to feel like you may vomit (nauseous), you vomit, or you have watery poop (diarrhea).  You have bad-smelling fluid coming from your vagina.  You have pain when you pee or your pee smells bad.  You have very bad swelling of your face, hands, ankles, feet, or legs.  You have a fever. Get help right away if:  You are leaking fluid from your vagina.  You have spotting or bleeding from your vagina.  You have very bad belly cramping or pain.  You have trouble breathing.  You have chest pain.  You faint.  You have not felt your baby move for the time period told by your doctor.  You have new or increased pain, swelling, or redness in an arm or leg. Summary  The second trimester of pregnancy is from week 13 through week 27  (months 4 through 6).  Eat healthy meals.  Exercise as told by your doctor. Most people can do their usual exercise during pregnancy.  Do not use herbal medicines, illegal drugs, or medicines that are not approved by your doctor. Do not drink alcohol.  Call your doctor if you get sick or if you notice anything unusual about your pregnancy. This information is not intended to replace advice given to you by your health care provider. Make sure you discuss any questions you have with your health care provider. Document Revised: 04/12/2020 Document Reviewed: 02/17/2020 Elsevier Patient Education  2021 [employer]. Common Medications Safe in Pregnancy  Acne:      Constipation:  Benzoyl Peroxide     Colace  Clindamycin      Dulcolax Suppository  Topica Erythromycin     Fibercon  Salicylic Acid        Metamucil         Miralax AVOID:        Senakot   Accutane    Cough:  Retin-A       Cough Drops  Tetracycline      Phenergan w/ Codeine if Rx  Minocycline      Robitussin (Plain & DM)  Antibiotics:     Crabs/Lice:  Ceclor       RID  Cephalosporins    AVOID:  E-Mycins      Kwell  Keflex  Macrobid/Macrodantin   Diarrhea:  Penicillin      Kao-Pectate  Zithromax      Imodium AD         PUSH FLUIDS AVOID:       Cipro     Fever:  Tetracycline      Tylenol (Regular or Extra  Minocycline       Strength)  Levaquin      Extra Strength-Do not          Exceed 8 tabs/24 hrs Caffeine:        <200mg/day (equiv. To 1 cup of coffee or  approx. 3 12 oz sodas)         Gas: Cold/Hayfever:       Gas-X  Benadryl      Mylicon  Claritin       Phazyme  **Claritin-D        Chlor-Trimeton    Headaches:  Dimetapp      ASA-Free Excedrin  Drixoral-Non-Drowsy     Cold Compress  Mucinex (Guaifenasin)     Tylenol (Regular or Extra  Sudafed/Sudafed-12 Hour     Strength)  **Sudafed PE Pseudoephedrine   Tylenol Cold & Sinus     Vicks Vapor Rub  Zyrtec  **AVOID if Problems With Blood  Pressure         Heartburn: Avoid lying down for at least 1 hour after meals  Aciphex      Maalox     Rash:  Milk of Magnesia     Benadryl    Mylanta       1% Hydrocortisone Cream  Pepcid  Pepcid Complete   Sleep Aids:  Prevacid      Ambien   Prilosec       Benadryl  Rolaids       Chamomile Tea  Tums (Limit 4/day)     Unisom         Tylenol PM         Warm milk-add vanilla or  Hemorrhoids:       Sugar for taste  Anusol/Anusol H.C.  (RX: Analapram 2.5%)  Sugar Substitutes:  Hydrocortisone OTC     Ok in moderation  Preparation H      Tucks        Vaseline lotion applied to tissue with wiping    Herpes:     Throat:  Acyclovir      Oragel  Famvir  Valtrex     Vaccines:         Flu Shot Leg Cramps:       *Gardasil  Benadryl      Hepatitis A         Hepatitis B Nasal Spray:       Pneumovax  Saline Nasal Spray     Polio Booster         Tetanus Nausea:       Tuberculosis test or PPD  Vitamin B6 25 mg TID   AVOID:      Dramamine      *Gardasil  Emetrol       Live Poliovirus  Ginger Root 250 mg QID    MMR (measles, mumps &  High Complex Carbs @ Bedtime    rebella)  Sea Bands-Accupressure    Varicella (Chickenpox)  Unisom 1/2 tab TID     *No known complications           If received before Pain:         Known pregnancy;   Darvocet       Resume series after  Lortab        Delivery  Percocet    Yeast:   Tramadol      Femstat  Tylenol 3      Gyne-lotrimin  Ultram       Monistat  Vicodin           MISC:         All Sunscreens           Hair Coloring/highlights          Insect Repellant's          (Including DEET)         Mystic Tans  

## 2021-01-23 NOTE — Progress Notes (Signed)
OB-pt present for routine prenatal visit. Pt stated that she was doing well.

## 2021-01-24 ENCOUNTER — Encounter: Payer: Self-pay | Admitting: Obstetrics and Gynecology

## 2021-02-22 ENCOUNTER — Encounter: Payer: Self-pay | Admitting: Obstetrics and Gynecology

## 2021-02-22 ENCOUNTER — Other Ambulatory Visit: Payer: Federal, State, Local not specified - PPO

## 2021-02-22 ENCOUNTER — Other Ambulatory Visit: Payer: Self-pay

## 2021-02-22 ENCOUNTER — Ambulatory Visit (INDEPENDENT_AMBULATORY_CARE_PROVIDER_SITE_OTHER): Payer: Federal, State, Local not specified - PPO | Admitting: Obstetrics and Gynecology

## 2021-02-22 VITALS — BP 110/68 | HR 84 | Wt 175.6 lb

## 2021-02-22 DIAGNOSIS — Z3A29 29 weeks gestation of pregnancy: Secondary | ICD-10-CM

## 2021-02-22 DIAGNOSIS — Z3483 Encounter for supervision of other normal pregnancy, third trimester: Secondary | ICD-10-CM

## 2021-02-22 DIAGNOSIS — Z23 Encounter for immunization: Secondary | ICD-10-CM | POA: Diagnosis not present

## 2021-02-22 DIAGNOSIS — Z3482 Encounter for supervision of other normal pregnancy, second trimester: Secondary | ICD-10-CM | POA: Diagnosis not present

## 2021-02-22 LAB — POCT URINALYSIS DIPSTICK OB
Bilirubin, UA: NEGATIVE
Blood, UA: NEGATIVE
Glucose, UA: NEGATIVE
Ketones, UA: NEGATIVE
Leukocytes, UA: NEGATIVE
Nitrite, UA: NEGATIVE
POC,PROTEIN,UA: NEGATIVE
Spec Grav, UA: 1.01 (ref 1.010–1.025)
Urobilinogen, UA: 0.2 E.U./dL
pH, UA: 6.5 (ref 5.0–8.0)

## 2021-02-22 NOTE — Progress Notes (Signed)
ROB: Rash between her breasts and complaint of yeast infection with thick vaginal discharge and vaginal itching.  Advised use of Monistat and topical antifungals.  Patient doing 1 hour GCT today.  Reports daily fetal movement.

## 2021-02-23 LAB — CBC
Hematocrit: 35.1 % (ref 34.0–46.6)
Hemoglobin: 11.4 g/dL (ref 11.1–15.9)
MCH: 30.2 pg (ref 26.6–33.0)
MCHC: 32.5 g/dL (ref 31.5–35.7)
MCV: 93 fL (ref 79–97)
Platelets: 199 10*3/uL (ref 150–450)
RBC: 3.78 x10E6/uL (ref 3.77–5.28)
RDW: 12.4 % (ref 11.7–15.4)
WBC: 14.2 10*3/uL — ABNORMAL HIGH (ref 3.4–10.8)

## 2021-02-23 LAB — GLUCOSE, 1 HOUR GESTATIONAL: Gestational Diabetes Screen: 130 mg/dL (ref 65–139)

## 2021-02-23 LAB — HEPATITIS C ANTIBODY: Hep C Virus Ab: <0.1 s/co ratio (ref 0.0–0.9)

## 2021-02-23 LAB — RPR: RPR Ser Ql: NONREACTIVE

## 2021-03-05 ENCOUNTER — Telehealth: Payer: Self-pay | Admitting: Obstetrics and Gynecology

## 2021-03-05 NOTE — Telephone Encounter (Signed)
Pt tested positive for covid on Saturday- after boyfriend tested positive Thursday. Pt states running fever, headache, cough and runny nose. For her next apt she is asking if she needs to cancel, I suggested televisit. Please Advise.

## 2021-03-16 ENCOUNTER — Ambulatory Visit (INDEPENDENT_AMBULATORY_CARE_PROVIDER_SITE_OTHER): Payer: Federal, State, Local not specified - PPO | Admitting: Obstetrics and Gynecology

## 2021-03-16 ENCOUNTER — Encounter: Payer: Self-pay | Admitting: Obstetrics and Gynecology

## 2021-03-16 ENCOUNTER — Other Ambulatory Visit: Payer: Self-pay

## 2021-03-16 DIAGNOSIS — Z3483 Encounter for supervision of other normal pregnancy, third trimester: Secondary | ICD-10-CM

## 2021-03-16 DIAGNOSIS — R81 Glycosuria: Secondary | ICD-10-CM

## 2021-03-16 DIAGNOSIS — O479 False labor, unspecified: Secondary | ICD-10-CM

## 2021-03-16 DIAGNOSIS — Z3A32 32 weeks gestation of pregnancy: Secondary | ICD-10-CM

## 2021-03-16 LAB — POCT URINALYSIS DIPSTICK OB
Bilirubin, UA: NEGATIVE
Blood, UA: NEGATIVE
Ketones, UA: NEGATIVE
Leukocytes, UA: NEGATIVE
Nitrite, UA: NEGATIVE
POC,PROTEIN,UA: NEGATIVE
Spec Grav, UA: 1.01 (ref 1.010–1.025)
Urobilinogen, UA: 0.2 E.U./dL
pH, UA: 7.5 (ref 5.0–8.0)

## 2021-03-16 NOTE — Patient Instructions (Addendum)
[place], [place], [place] [address]  Phone: [phone]   [facility] (second location)  [address] [place], [place] [address]  Phone: [phone]   [facility]) [address], [place], [place] [address] Phone: [phone]   [facility]  [address], [place], [place] [address]  Phone: [phone]     Common Medications Safe in Pregnancy  Acne:      Constipation:  Benzoyl Peroxide     Colace  Clindamycin      Dulcolax Suppository  Topica Erythromycin     Fibercon  Salicylic Acid      Metamucil         Miralax AVOID:        Senakot   Accutane    Cough:  Retin-A       Cough Drops  Tetracycline      Phenergan w/ Codeine if Rx  Minocycline      Robitussin (Plain & DM)  Antibiotics:     Crabs/Lice:  Ceclor       RID  Cephalosporins    AVOID:  E-Mycins      Kwell  Keflex  Macrobid/Macrodantin   Diarrhea:  Penicillin      Kao-Pectate  Zithromax      Imodium AD         PUSH FLUIDS AVOID:       Cipro     Fever:  Tetracycline      Tylenol (Regular or Extra  Minocycline       Strength)  Levaquin      Extra Strength-Do not          Exceed 8 tabs/24 hrs Caffeine:        <236m/day (equiv. To 1 cup of coffee or  approx. 3 12 oz sodas)         Gas: Cold/Hayfever:       Gas-X  Benadryl      Mylicon  Claritin       Phazyme  **Claritin-D        Chlor-Trimeton    Headaches:  Dimetapp      ASA-Free Excedrin  Drixoral-Non-Drowsy     Cold Compress  Mucinex (Guaifenasin)     Tylenol (Regular or Extra  Sudafed/Sudafed-12 Hour     Strength)  **Sudafed PE Pseudoephedrine   Tylenol Cold & Sinus     Vicks Vapor Rub  Zyrtec  **AVOID if Problems With Blood Pressure         Heartburn: Avoid lying down for at least 1 hour after meals  Aciphex      Maalox     Rash:  Milk of Magnesia     Benadryl    Mylanta       1% Hydrocortisone  Cream  Pepcid  Pepcid Complete   Sleep Aids:  Prevacid      Ambien   Prilosec       Benadryl  Rolaids       Chamomile Tea  Tums (Limit 4/day)     Unisom         Tylenol PM         Warm milk-add vanilla or  Hemorrhoids:       Sugar for taste  Anusol/Anusol H.C.  (RX: Analapram 2.5%)  Sugar Substitutes:  Hydrocortisone OTC     Ok in moderation  Preparation H      Tucks  applied to tissue with wiping    Herpes:     Throat:  Acyclovir      Oragel  Famvir  Valtrex     Vaccines:         Flu Shot Leg Cramps:       *Gardasil  Benadryl      Hepatitis A         Hepatitis B Nasal Spray:       Pneumovax  Saline Nasal Spray     Polio Booster         Tetanus Nausea:       Tuberculosis test or PPD  Vitamin B6 25 mg TID   AVOID:    Dramamine      *Gardasil  Emetrol       Live Poliovirus  Ginger Root 250 mg QID    MMR (measles, mumps &  High Complex Carbs @ Bedtime    rebella)  Sea Bands-Accupressure    Varicella (Chickenpox)  Unisom 1/2 tab TID     *No known complications           If received before Pain:         Known pregnancy;   Darvocet       Resume series after  Lortab        Delivery  Percocet    Yeast:   Tramadol      Femstat  Tylenol 3      Gyne-lotrimin  Ultram       Monistat  Vicodin           MISC:         All Sunscreens           Hair Coloring/highlights          Insect Repellant's          (Including DEET)         Mystic Tans  

## 2021-03-16 NOTE — Progress Notes (Signed)
ROB: Notes pelvic pressure and Braxton Hicks. Discussed circumcision (desired), Pediatrician list. Advised on pain management options in labor. Given birth plan. Glucosuria noted today. Will f/u next visit. Had questions regarding last visit and being told she may need a C-section because of an infection.  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  RTC in 2-3 weeks.

## 2021-03-16 NOTE — Progress Notes (Signed)
OB-Pt present for routine prenatal care. Pt c/o lower abd pressure and braxton hick contractions.

## 2021-03-21 DIAGNOSIS — H43391 Other vitreous opacities, right eye: Secondary | ICD-10-CM | POA: Diagnosis not present

## 2021-03-21 DIAGNOSIS — H35413 Lattice degeneration of retina, bilateral: Secondary | ICD-10-CM | POA: Diagnosis not present

## 2021-03-28 DIAGNOSIS — H33321 Round hole, right eye: Secondary | ICD-10-CM | POA: Diagnosis not present

## 2021-04-02 DIAGNOSIS — H33322 Round hole, left eye: Secondary | ICD-10-CM | POA: Diagnosis not present

## 2021-04-06 ENCOUNTER — Other Ambulatory Visit: Payer: Self-pay

## 2021-04-06 ENCOUNTER — Ambulatory Visit (INDEPENDENT_AMBULATORY_CARE_PROVIDER_SITE_OTHER): Payer: Federal, State, Local not specified - PPO | Admitting: Obstetrics and Gynecology

## 2021-04-06 ENCOUNTER — Encounter: Payer: Self-pay | Admitting: Obstetrics and Gynecology

## 2021-04-06 VITALS — BP 137/80 | HR 121 | Wt 181.8 lb

## 2021-04-06 DIAGNOSIS — Z3483 Encounter for supervision of other normal pregnancy, third trimester: Secondary | ICD-10-CM

## 2021-04-06 DIAGNOSIS — Z3A35 35 weeks gestation of pregnancy: Secondary | ICD-10-CM

## 2021-04-06 LAB — POCT URINALYSIS DIPSTICK OB
Bilirubin, UA: NEGATIVE
Ketones, UA: 80
Nitrite, UA: NEGATIVE
Spec Grav, UA: >=1.03 — AB (ref 1.010–1.025)
Urobilinogen, UA: 0.2 E.U./dL
pH, UA: 5 (ref 5.0–8.0)

## 2021-04-06 NOTE — Progress Notes (Addendum)
ROB: She complains of intermittent/abdominal occasional pelvic pain.  I examined her and she had one of the pains in my presence.  No contractions noted- the pain was associated with fetal movement.  Signs and symptoms of labor discussed.   We have discussed this in some detail.  Glucosuria again noted.  To consider HSV-1 prophylaxis at next visit.  Cultures next visit.

## 2021-04-11 ENCOUNTER — Encounter: Payer: Federal, State, Local not specified - PPO | Admitting: Obstetrics and Gynecology

## 2021-04-12 NOTE — Patient Instructions (Signed)
Common Medications Safe in Pregnancy  Acne:      Constipation:  Benzoyl Peroxide     Colace  Clindamycin      Dulcolax Suppository  Topica Erythromycin     Fibercon  Salicylic Acid      Metamucil         Miralax AVOID:        Senakot   Accutane    Cough:  Retin-A       Cough Drops  Tetracycline      Phenergan w/ Codeine if Rx  Minocycline      Robitussin (Plain & DM)  Antibiotics:     Crabs/Lice:  Ceclor       RID  Cephalosporins    AVOID:  E-Mycins      Kwell  Keflex  Macrobid/Macrodantin   Diarrhea:  Penicillin      Kao-Pectate  Zithromax      Imodium AD         PUSH FLUIDS AVOID:       Cipro     Fever:  Tetracycline      Tylenol (Regular or Extra  Minocycline       Strength)  Levaquin      Extra Strength-Do not          Exceed 8 tabs/24 hrs Caffeine:        <200mg/day (equiv. To 1 cup of coffee or  approx. 3 12 oz sodas)         Gas: Cold/Hayfever:       Gas-X  Benadryl      Mylicon  Claritin       Phazyme  **Claritin-D        Chlor-Trimeton    Headaches:  Dimetapp      ASA-Free Excedrin  Drixoral-Non-Drowsy     Cold Compress  Mucinex (Guaifenasin)     Tylenol (Regular or Extra  Sudafed/Sudafed-12 Hour     Strength)  **Sudafed PE Pseudoephedrine   Tylenol Cold & Sinus     Vicks Vapor Rub  Zyrtec  **AVOID if Problems With Blood Pressure         Heartburn: Avoid lying down for at least 1 hour after meals  Aciphex      Maalox     Rash:  Milk of Magnesia     Benadryl    Mylanta       1% Hydrocortisone Cream  Pepcid  Pepcid Complete   Sleep Aids:  Prevacid      Ambien   Prilosec       Benadryl  Rolaids       Chamomile Tea  Tums (Limit 4/day)     Unisom         Tylenol PM         Warm milk-add vanilla or  Hemorrhoids:       Sugar for taste  Anusol/Anusol H.C.  (RX: Analapram 2.5%)  Sugar Substitutes:  Hydrocortisone OTC     Ok in moderation  Preparation H      Tucks        Vaseline lotion applied to tissue with  wiping    Herpes:     Throat:  Acyclovir      Oragel  Famvir  Valtrex     Vaccines:         Flu Shot Leg Cramps:       *Gardasil  Benadryl      Hepatitis A         Hepatitis B Nasal Spray:         Pneumovax  Saline Nasal Spray     Polio Booster         Tetanus Nausea:       Tuberculosis test or PPD  Vitamin B6 25 mg TID   AVOID:    Dramamine      *Gardasil  Emetrol       Live Poliovirus  Ginger Root 250 mg QID    MMR (measles, mumps &  High Complex Carbs @ Bedtime    rebella)  Sea Bands-Accupressure    Varicella (Chickenpox)  Unisom 1/2 tab TID     *No known complications           If received before Pain:         Known pregnancy;   Darvocet       Resume series after  Lortab        Delivery  Percocet    Yeast:   Tramadol      Femstat  Tylenol 3      Gyne-lotrimin  Ultram       Monistat  Vicodin           MISC:         All Sunscreens           Hair Coloring/highlights          Insect Repellant's          (Including DEET)         Mystic Tans Breastfeeding  Choosing to breastfeed is one of the best decisions you can make for yourself and your baby. A change in hormones during pregnancy causes your breasts to make breast milk in your milk-producing glands. Hormones prevent breast milk from being released before your baby is born. They also prompt milk flow after birth. Once breastfeeding has begun, thoughts of your baby, as well as his or her sucking or crying, can stimulate the release of milk from your milk-producing glands. Benefits of breastfeeding Research shows that breastfeeding offers many health benefits for infants and mothers. It also offers a cost-free and convenient way to feed your baby. For your baby  Your first milk (colostrum) helps your baby's digestive system to function better.  Special cells in your milk (antibodies) help your baby to fight off infections.  Breastfed babies are less likely to develop asthma, allergies, obesity, or type 2 diabetes. They  are also at lower risk for sudden infant death syndrome (SIDS).  Nutrients in breast milk are better able to meet your baby's needs compared to infant formula.  Breast milk improves your baby's brain development. For you  Breastfeeding helps to create a very special bond between you and your baby.  Breastfeeding is convenient. Breast milk costs nothing and is always available at the correct temperature.  Breastfeeding helps to burn calories. It helps you to lose the weight that you gained during pregnancy.  Breastfeeding makes your uterus return faster to its size before pregnancy. It also slows bleeding (lochia) after you give birth.  Breastfeeding helps to lower your risk of developing type 2 diabetes, osteoporosis, rheumatoid arthritis, cardiovascular disease, and breast, ovarian, uterine, and endometrial cancer later in life. Breastfeeding basics Starting breastfeeding  Find a comfortable place to sit or lie down, with your neck and back well-supported.  Place a pillow or a rolled-up blanket under your baby to bring him or her to the level of your breast (if you are seated). Nursing pillows are specially designed to help support your arms and your baby while you  breastfeed.  Make sure that your baby's tummy (abdomen) is facing your abdomen.  Gently massage your breast. With your fingertips, massage from the outer edges of your breast inward toward the nipple. This encourages milk flow. If your milk flows slowly, you may need to continue this action during the feeding.  Support your breast with 4 fingers underneath and your thumb above your nipple (make the letter "C" with your hand). Make sure your fingers are well away from your nipple and your baby's mouth.  Stroke your baby's lips gently with your finger or nipple.  When your baby's mouth is open wide enough, quickly bring your baby to your breast, placing your entire nipple and as much of the areola as possible into your baby's  mouth. The areola is the colored area around your nipple. ? More areola should be visible above your baby's upper lip than below the lower lip. ? Your baby's lips should be opened and extended outward (flanged) to ensure an adequate, comfortable latch. ? Your baby's tongue should be between his or her lower gum and your breast.  Make sure that your baby's mouth is correctly positioned around your nipple (latched). Your baby's lips should create a seal on your breast and be turned out (everted).  It is common for your baby to suck about 2-3 minutes in order to start the flow of breast milk. Latching Teaching your baby how to latch onto your breast properly is very important. An improper latch can cause nipple pain, decreased milk supply, and poor weight gain in your baby. Also, if your baby is not latched onto your nipple properly, he or she may swallow some air during feeding. This can make your baby fussy. Burping your baby when you switch breasts during the feeding can help to get rid of the air. However, teaching your baby to latch on properly is still the best way to prevent fussiness from swallowing air while breastfeeding. Signs that your baby has successfully latched onto your nipple  Silent tugging or silent sucking, without causing you pain. Infant's lips should be extended outward (flanged).  Swallowing heard between every 3-4 sucks once your milk has started to flow (after your let-down milk reflex occurs).  Muscle movement above and in front of his or her ears while sucking. Signs that your baby has not successfully latched onto your nipple  Sucking sounds or smacking sounds from your baby while breastfeeding.  Nipple pain. If you think your baby has not latched on correctly, slip your finger into the corner of your baby's mouth to break the suction and place it between your baby's gums. Attempt to start breastfeeding again. Signs of successful breastfeeding Signs from your  baby  Your baby will gradually decrease the number of sucks or will completely stop sucking.  Your baby will fall asleep.  Your baby's body will relax.  Your baby will retain a small amount of milk in his or her mouth.  Your baby will let go of your breast by himself or herself. Signs from you  Breasts that have increased in firmness, weight, and size 1-3 hours after feeding.  Breasts that are softer immediately after breastfeeding.  Increased milk volume, as well as a change in milk consistency and color by the fifth day of breastfeeding.  Nipples that are not sore, cracked, or bleeding. Signs that your baby is getting enough milk  Wetting at least 1-2 diapers during the first 24 hours after birth.  Wetting at least 5-6  diapers every 24 hours for the first week after birth. The urine should be clear or pale yellow by the age of 5 days.  Wetting 6-8 diapers every 24 hours as your baby continues to grow and develop.  At least 3 stools in a 24-hour period by the age of 5 days. The stool should be soft and yellow.  At least 3 stools in a 24-hour period by the age of 7 days. The stool should be seedy and yellow.  No loss of weight greater than 10% of birth weight during the first 3 days of life.  Average weight gain of 4-7 oz (113-198 g) per week after the age of 4 days.  Consistent daily weight gain by the age of 5 days, without weight loss after the age of 2 weeks. After a feeding, your baby may spit up a small amount of milk. This is normal. Breastfeeding frequency and duration Frequent feeding will help you make more milk and can prevent sore nipples and extremely full breasts (breast engorgement). Breastfeed when you feel the need to reduce the fullness of your breasts or when your baby shows signs of hunger. This is called "breastfeeding on demand." Signs that your baby is hungry include:  Increased alertness, activity, or restlessness.  Movement of the head from side to  side.  Opening of the mouth when the corner of the mouth or cheek is stroked (rooting).  Increased sucking sounds, smacking lips, cooing, sighing, or squeaking.  Hand-to-mouth movements and sucking on fingers or hands.  Fussing or crying. Avoid introducing a pacifier to your baby in the first 4-6 weeks after your baby is born. After this time, you may choose to use a pacifier. Research has shown that pacifier use during the first year of a baby's life decreases the risk of sudden infant death syndrome (SIDS). Allow your baby to feed on each breast as long as he or she wants. When your baby unlatches or falls asleep while feeding from the first breast, offer the second breast. Because newborns are often sleepy in the first few weeks of life, you may need to awaken your baby to get him or her to feed. Breastfeeding times will vary from baby to baby. However, the following rules can serve as a guide to help you make sure that your baby is properly fed:  Newborns (babies 4 weeks of age or younger) may breastfeed every 1-3 hours.  Newborns should not go without breastfeeding for longer than 3 hours during the day or 5 hours during the night.  You should breastfeed your baby a minimum of 8 times in a 24-hour period. Breast milk pumping Pumping and storing breast milk allows you to make sure that your baby is exclusively fed your breast milk, even at times when you are unable to breastfeed. This is especially important if you go back to work while you are still breastfeeding, or if you are not able to be present during feedings. Your lactation consultant can help you find a method of pumping that works best for you and give you guidelines about how long it is safe to store breast milk.      Caring for your breasts while you breastfeed Nipples can become dry, cracked, and sore while breastfeeding. The following recommendations can help keep your breasts moisturized and healthy:  Avoid using soap on  your nipples.  Wear a supportive bra designed especially for nursing. Avoid wearing underwire-style bras or extremely tight bras (sports bras).  Air-dry   your nipples for 3-4 minutes after each feeding.  Use only cotton bra pads to absorb leaked breast milk. Leaking of breast milk between feedings is normal.  Use lanolin on your nipples after breastfeeding. Lanolin helps to maintain your skin's normal moisture barrier. Pure lanolin is not harmful (not toxic) to your baby. You may also hand express a few drops of breast milk and gently massage that milk into your nipples and allow the milk to air-dry. In the first few weeks after giving birth, some women experience breast engorgement. Engorgement can make your breasts feel heavy, warm, and tender to the touch. Engorgement peaks within 3-5 days after you give birth. The following recommendations can help to ease engorgement:  Completely empty your breasts while breastfeeding or pumping. You may want to start by applying warm, moist heat (in the shower or with warm, water-soaked hand towels) just before feeding or pumping. This increases circulation and helps the milk flow. If your baby does not completely empty your breasts while breastfeeding, pump any extra milk after he or she is finished.  Apply ice packs to your breasts immediately after breastfeeding or pumping, unless this is too uncomfortable for you. To do this: ? Put ice in a plastic bag. ? Place a towel between your skin and the bag. ? Leave the ice on for 20 minutes, 2-3 times a day.  Make sure that your baby is latched on and positioned properly while breastfeeding. If engorgement persists after 48 hours of following these recommendations, contact your health care provider or a Science writer. Overall health care recommendations while breastfeeding  Eat 3 healthy meals and 3 snacks every day. Well-nourished mothers who are breastfeeding need an additional 450-500 calories a day.  You can meet this requirement by increasing the amount of a balanced diet that you eat.  Drink enough water to keep your urine pale yellow or clear.  Rest often, relax, and continue to take your prenatal vitamins to prevent fatigue, stress, and low vitamin and mineral levels in your body (nutrient deficiencies).  Do not use any products that contain nicotine or tobacco, such as cigarettes and e-cigarettes. Your baby may be harmed by chemicals from cigarettes that pass into breast milk and exposure to secondhand smoke. If you need help quitting, ask your health care provider.  Avoid alcohol.  Do not use illegal drugs or marijuana.  Talk with your health care provider before taking any medicines. These include over-the-counter and prescription medicines as well as vitamins and herbal supplements. Some medicines that may be harmful to your baby can pass through breast milk.  It is possible to become pregnant while breastfeeding. If birth control is desired, ask your health care provider about options that will be safe while breastfeeding your baby. Where to find more information: [employer]: www.llli.org Contact a health care provider if:  You feel like you want to stop breastfeeding or have become frustrated with breastfeeding.  Your nipples are cracked or bleeding.  Your breasts are red, tender, or warm.  You have: ? Painful breasts or nipples. ? A swollen area on either breast. ? A fever or chills. ? Nausea or vomiting. ? Drainage other than breast milk from your nipples.  Your breasts do not become full before feedings by the fifth day after you give birth.  You feel sad and depressed.  Your baby is: ? Too sleepy to eat well. ? Having trouble sleeping. ? More than 66 week old and wetting fewer than  6 diapers in a 24-hour period. ? Not gaining weight by 17 days of age.  Your baby has fewer than 3 stools in a 24-hour period.  Your baby's skin or the white  parts of his or her eyes become yellow. Get help right away if:  Your baby is overly tired (lethargic) and does not want to wake up and feed.  Your baby develops an unexplained fever. Summary  Breastfeeding offers many health benefits for infant and mothers.  Try to breastfeed your infant when he or she shows early signs of hunger.  Gently tickle or stroke your baby's lips with your finger or nipple to allow the baby to open his or her mouth. Bring the baby to your breast. Make sure that much of the areola is in your baby's mouth. Offer one side and burp the baby before you offer the other side.  Talk with your health care provider or lactation consultant if you have questions or you face problems as you breastfeed. This information is not intended to replace advice given to you by your health care provider. Make sure you discuss any questions you have with your health care provider. Document Revised: 01/29/2018 Document Reviewed: 12/06/2016 Elsevier Patient Education  2021 [employer].

## 2021-04-13 ENCOUNTER — Other Ambulatory Visit: Payer: Self-pay

## 2021-04-13 ENCOUNTER — Encounter: Payer: Self-pay | Admitting: Obstetrics and Gynecology

## 2021-04-13 ENCOUNTER — Ambulatory Visit (INDEPENDENT_AMBULATORY_CARE_PROVIDER_SITE_OTHER): Payer: Federal, State, Local not specified - PPO | Admitting: Obstetrics and Gynecology

## 2021-04-13 VITALS — BP 126/77 | HR 102 | Wt 185.1 lb

## 2021-04-13 DIAGNOSIS — O26893 Other specified pregnancy related conditions, third trimester: Secondary | ICD-10-CM

## 2021-04-13 DIAGNOSIS — Z3483 Encounter for supervision of other normal pregnancy, third trimester: Secondary | ICD-10-CM | POA: Diagnosis not present

## 2021-04-13 DIAGNOSIS — B009 Herpesviral infection, unspecified: Secondary | ICD-10-CM

## 2021-04-13 DIAGNOSIS — Z3A36 36 weeks gestation of pregnancy: Secondary | ICD-10-CM

## 2021-04-13 DIAGNOSIS — N898 Other specified noninflammatory disorders of vagina: Secondary | ICD-10-CM

## 2021-04-13 LAB — POCT URINALYSIS DIPSTICK OB
Bilirubin, UA: NEGATIVE
Blood, UA: NEGATIVE
Glucose, UA: NEGATIVE
Ketones, UA: NEGATIVE
Nitrite, UA: NEGATIVE
Spec Grav, UA: 1.025 (ref 1.010–1.025)
Urobilinogen, UA: 0.2 E.U./dL
pH, UA: 6 (ref 5.0–8.0)

## 2021-04-13 NOTE — Progress Notes (Signed)
OB-Pt present for routine prenatal care and 36 week cultures. Pt stated that she was having dizziness.

## 2021-04-13 NOTE — Progress Notes (Signed)
ROB: Had laser procedure done on her eyes (has retinal thining) over the last 2 weeks.  Is complaining of some dizziness over the weekend x 2 days, usually lasting ~ 15 minutes. Reports adequate hydration. 36 week labs done. Reports vaginal itching for several days. Will do Nuswab. RTC in 1 week. To begin HSV prophylaxis. Discussed labor precautions.

## 2021-04-15 LAB — STREP GP B NAA: Strep Gp B NAA: NEGATIVE

## 2021-04-18 ENCOUNTER — Encounter: Payer: Self-pay | Admitting: Obstetrics and Gynecology

## 2021-04-18 ENCOUNTER — Observation Stay
Admission: EM | Admit: 2021-04-18 | Discharge: 2021-04-18 | Disposition: A | Discharge status: Home / Self Care | Payer: Federal, State, Local not specified - PPO | Attending: Obstetrics and Gynecology | Admitting: Obstetrics and Gynecology

## 2021-04-18 ENCOUNTER — Other Ambulatory Visit: Payer: Self-pay

## 2021-04-18 ENCOUNTER — Other Ambulatory Visit: Payer: Self-pay | Admitting: Obstetrics and Gynecology

## 2021-04-18 ENCOUNTER — Telehealth: Payer: Self-pay | Admitting: Obstetrics and Gynecology

## 2021-04-18 DIAGNOSIS — Z3A36 36 weeks gestation of pregnancy: Secondary | ICD-10-CM

## 2021-04-18 DIAGNOSIS — O23593 Infection of other part of genital tract in pregnancy, third trimester: Secondary | ICD-10-CM | POA: Diagnosis not present

## 2021-04-18 DIAGNOSIS — B3731 Acute candidiasis of vulva and vagina: Secondary | ICD-10-CM | POA: Diagnosis present

## 2021-04-18 DIAGNOSIS — B373 Candidiasis of vulva and vagina: Secondary | ICD-10-CM

## 2021-04-18 DIAGNOSIS — N898 Other specified noninflammatory disorders of vagina: Secondary | ICD-10-CM | POA: Diagnosis present

## 2021-04-18 DIAGNOSIS — O26893 Other specified pregnancy related conditions, third trimester: Secondary | ICD-10-CM | POA: Diagnosis present

## 2021-04-18 DIAGNOSIS — Z3403 Encounter for supervision of normal first pregnancy, third trimester: Secondary | ICD-10-CM

## 2021-04-18 DIAGNOSIS — O98813 Other maternal infectious and parasitic diseases complicating pregnancy, third trimester: Secondary | ICD-10-CM

## 2021-04-18 LAB — NUSWAB VAGINITIS PLUS (VG+)
Candida albicans, NAA: POSITIVE — AB
Candida glabrata, NAA: NEGATIVE
Chlamydia trachomatis, NAA: NEGATIVE
Neisseria gonorrhoeae, NAA: NEGATIVE
Trich vag by NAA: NEGATIVE

## 2021-04-18 LAB — RUPTURE OF MEMBRANE (ROM)PLUS: Rom Plus: NEGATIVE

## 2021-04-18 MED ORDER — FLUCONAZOLE 50 MG PO TABS
150.0000 mg | ORAL_TABLET | Freq: Once | ORAL | Status: DC
  Filled 2021-04-18: qty 1

## 2021-04-18 MED ORDER — VALACYCLOVIR HCL 500 MG PO TABS: 500.0000 mg | ORAL_TABLET | Freq: Two times a day (BID) | ORAL | 0 refills | Status: DC

## 2021-04-18 MED ORDER — FLUCONAZOLE 150 MG PO TABS: 150.0000 mg | ORAL_TABLET | Freq: Once | ORAL | 3 refills | Status: AC

## 2021-04-18 NOTE — Discharge Instructions (Signed)

## 2021-04-18 NOTE — Telephone Encounter (Signed)
Patient states she has been leaking a clear fluid late last night.  Patient states it is enough to run down her leg.  She states that the liquid does not have a smell and is not urine.  Please advise

## 2021-04-18 NOTE — Final Progress Note (Signed)
L&D OB Triage Note  Samantha Ramsey is a 25 y.o. G2P0010 female at [redacted]w[redacted]d, EDD Estimated Date of Delivery: 05/10/21 who presented to triage for complaints of possible leaking of fluids since late last night (has been running down her leg).  She was evaluated by the nurses with no significant findings for ruptured membranes. Vital signs stable. An NST was performed and has been reviewed by MD. Review of chart notes recent candida vaginal infection. She was treated with Diflucan.   NST INTERPRETATION: Indications: rule out uterine contractions  Mode: External Baseline Rate (A): 150 bpm Variability: Moderate Accelerations: 15 x 15 Decelerations: None     Contraction Frequency (min): Rare w/UI  Impression: reactive   Plan: NST performed was reviewed and was found to be reactive. She was discharged home with bleeding/labor precautions.  Continue routine prenatal care. Follow up with OB/GYN as previously scheduled. Encouraged to begin HSV prophylaxis for remainder of her pregnancy Prescription sent.     Rubie Maid, MD  [facility]

## 2021-04-18 NOTE — Telephone Encounter (Signed)
Pt stated that she had a clear liquid running down her legs and it was not urine. Pt was advised to go the L&D. Pt stated that she was on her way now.

## 2021-04-20 ENCOUNTER — Ambulatory Visit (INDEPENDENT_AMBULATORY_CARE_PROVIDER_SITE_OTHER): Payer: Federal, State, Local not specified - PPO | Admitting: Obstetrics and Gynecology

## 2021-04-20 ENCOUNTER — Other Ambulatory Visit: Payer: Self-pay

## 2021-04-20 ENCOUNTER — Encounter: Payer: Self-pay | Admitting: Obstetrics and Gynecology

## 2021-04-20 VITALS — BP 109/76 | HR 106 | Wt 185.3 lb

## 2021-04-20 DIAGNOSIS — Z3A37 37 weeks gestation of pregnancy: Secondary | ICD-10-CM

## 2021-04-20 DIAGNOSIS — Z3483 Encounter for supervision of other normal pregnancy, third trimester: Secondary | ICD-10-CM

## 2021-04-20 LAB — POCT URINALYSIS DIPSTICK OB
Bilirubin, UA: NEGATIVE
Blood, UA: NEGATIVE
Glucose, UA: NEGATIVE
Ketones, UA: NEGATIVE
Nitrite, UA: NEGATIVE
POC,PROTEIN,UA: NEGATIVE
Spec Grav, UA: 1.01 (ref 1.010–1.025)
Urobilinogen, UA: 0.2 E.U./dL
pH, UA: 6.5 (ref 5.0–8.0)

## 2021-04-20 NOTE — Progress Notes (Signed)
ROB: States she is doing well.  Denies contractions.  Has not yet started her HSV prophylaxis or treatment for monilia.  She says she "plans to pick up her prescriptions today".

## 2021-04-27 ENCOUNTER — Other Ambulatory Visit: Payer: Self-pay

## 2021-04-27 ENCOUNTER — Ambulatory Visit (INDEPENDENT_AMBULATORY_CARE_PROVIDER_SITE_OTHER): Payer: Federal, State, Local not specified - PPO | Admitting: Obstetrics and Gynecology

## 2021-04-27 ENCOUNTER — Encounter: Payer: Self-pay | Admitting: Obstetrics and Gynecology

## 2021-04-27 VITALS — BP 122/81 | HR 111 | Wt 185.7 lb

## 2021-04-27 DIAGNOSIS — Z3483 Encounter for supervision of other normal pregnancy, third trimester: Secondary | ICD-10-CM

## 2021-04-27 DIAGNOSIS — Z3A38 38 weeks gestation of pregnancy: Secondary | ICD-10-CM

## 2021-04-27 DIAGNOSIS — B009 Herpesviral infection, unspecified: Secondary | ICD-10-CM

## 2021-04-27 LAB — POCT URINALYSIS DIPSTICK OB
Bilirubin, UA: NEGATIVE
Blood, UA: NEGATIVE
Glucose, UA: NEGATIVE
Ketones, UA: NEGATIVE
Nitrite, UA: NEGATIVE
Spec Grav, UA: 1.015 (ref 1.010–1.025)
Urobilinogen, UA: 0.2 E.U./dL
pH, UA: 6 (ref 5.0–8.0)

## 2021-04-27 NOTE — Progress Notes (Signed)
ROB: Notes Braxton Hicks and pelvic pressure. Is taking HSV prophylaxis. Reiterated labor precautions.  RTC in 1 week.

## 2021-04-27 NOTE — Progress Notes (Signed)
OB-pt present for routine prenatal care. Pt stated that she was having lower abd pain and pressure and braxton hick contractions.

## 2021-04-30 ENCOUNTER — Encounter: Payer: Self-pay | Admitting: Obstetrics and Gynecology

## 2021-05-03 ENCOUNTER — Encounter: Payer: Self-pay | Admitting: Obstetrics and Gynecology

## 2021-05-03 ENCOUNTER — Ambulatory Visit (INDEPENDENT_AMBULATORY_CARE_PROVIDER_SITE_OTHER): Payer: Federal, State, Local not specified - PPO | Admitting: Obstetrics and Gynecology

## 2021-05-03 ENCOUNTER — Other Ambulatory Visit: Payer: Self-pay

## 2021-05-03 VITALS — BP 133/79 | HR 112 | Wt 183.4 lb

## 2021-05-03 DIAGNOSIS — Z3A39 39 weeks gestation of pregnancy: Secondary | ICD-10-CM

## 2021-05-03 DIAGNOSIS — Z0289 Encounter for other administrative examinations: Secondary | ICD-10-CM

## 2021-05-03 DIAGNOSIS — Z3483 Encounter for supervision of other normal pregnancy, third trimester: Secondary | ICD-10-CM

## 2021-05-03 NOTE — Progress Notes (Signed)
ROB: Doing well.  Denies contractions.  Reports daily fetal movement.  Signs and symptoms of labor discussed.  NST with next visit.

## 2021-05-04 ENCOUNTER — Telehealth: Payer: Self-pay

## 2021-05-04 ENCOUNTER — Telehealth: Payer: Self-pay | Admitting: Obstetrics and Gynecology

## 2021-05-04 NOTE — Telephone Encounter (Signed)
Spoke to pt and informed pt of the ifnormation given by [facility] [employer].   We could probably just adjust the FMLA start date to 6/20 and fax a new form.  We usually release patients back to work after their postpartum visit, so if her visit is pushed out 3 more days, then she can be excused from work on the back end for those 3 days at that time.    Informed pt of the information and then her mother got on the phone questioning if we could write her a letter to release her from work Monday-Friday for sick leave due to her being pregnant and tried and not wanting to work. Pt was informed that we only write letter for work restrictions and not to take pt's out of work due to they do not want to work. Pt's mother stated that the pt was tired and when she was pregnant she did not work the week before she had a baby and thought that it was crazy that we did not give work notes for release of work due to just pregnancy and not an illness. Pt's mother stated that they would figure out something.

## 2021-05-04 NOTE — Telephone Encounter (Signed)
Samantha Ramsey's mom called in and states that Samantha Ramsey is struggling with the last week of her pregnancy.  Samantha Ramsey filled out her FMLA request to start on 6/23.  Samantha Ramsey didn't want to go back to work and was going to use her sick pay to cover the days she isn't working starting on 6/20, 6/21, and 6/22.  Samantha Ramsey wants to know if a letter can be written going ahead and taking her out of work to cover her from her job so she doesn't get in trouble for the 3 days she wont be there and calling out.  Mom states this is urgent and would like to know something asap and would like a phone call today.  Mom states she would like to pick up the letter today as well.  Please advise.

## 2021-05-07 NOTE — Telephone Encounter (Signed)
Please see another phone encounter from Friday, May 04, 2021.

## 2021-05-10 ENCOUNTER — Other Ambulatory Visit: Payer: Federal, State, Local not specified - PPO

## 2021-05-10 ENCOUNTER — Ambulatory Visit (INDEPENDENT_AMBULATORY_CARE_PROVIDER_SITE_OTHER): Payer: Federal, State, Local not specified - PPO | Admitting: Obstetrics and Gynecology

## 2021-05-10 ENCOUNTER — Other Ambulatory Visit: Payer: Self-pay

## 2021-05-10 ENCOUNTER — Encounter: Payer: Self-pay | Admitting: Obstetrics and Gynecology

## 2021-05-10 VITALS — BP 107/72 | HR 101 | Wt 190.6 lb

## 2021-05-10 DIAGNOSIS — Z3A40 40 weeks gestation of pregnancy: Secondary | ICD-10-CM

## 2021-05-10 DIAGNOSIS — Z3483 Encounter for supervision of other normal pregnancy, third trimester: Secondary | ICD-10-CM | POA: Diagnosis not present

## 2021-05-10 DIAGNOSIS — O480 Post-term pregnancy: Secondary | ICD-10-CM | POA: Diagnosis not present

## 2021-05-10 LAB — POCT URINALYSIS DIPSTICK OB
Bilirubin, UA: NEGATIVE
Blood, UA: NEGATIVE
Glucose, UA: NEGATIVE
Ketones, UA: NEGATIVE
Leukocytes, UA: NEGATIVE
Nitrite, UA: NEGATIVE
Spec Grav, UA: 1.025 (ref 1.010–1.025)
Urobilinogen, UA: 0.2 E.U./dL
pH, UA: 6 (ref 5.0–8.0)

## 2021-05-10 NOTE — Progress Notes (Signed)
OB-Pt present for routine prenatal care and NST. Pt stated having lower abd  and pelvic pain.

## 2021-05-10 NOTE — Patient Instructions (Signed)
COVID 19 Instructions for Scheduled Procedure (Inductions/C-sections and GYN surgeries)   Thank you for choosing [facility] for your services.  You have been scheduled for a procedure called _______Induction of Labor________________.    Your procedure is scheduled on ________June 28, 2022 at 5:00 A.M._______________.  You are required to have COVID-19 testing performed 2 days prior to your scheduled procedure date.  Testing is performed between 9 AM and 1 PM Monday through Friday.  Please present for testing on ______Friday, June 24, 2022___________ during this hour. Testing is performed in the [place] (this is next to the [employer]).    Upon your scheduled procedure date, you will need to arrive at the [place] entrance. (There is a statue at the front of this entrance.)   Please arrive on time if you are scheduled for an induction of labor.   If you are scheduled for a Cesarean delivery or for Gyn Surgery, arrive 2 hours prior to your procedure time.   If you are an Obstetric patient and your arrival time falls between 11 PM and 6 AM call L&D [phone]) when you arrive.  A staff member will meet you at the [place] entrance.  At this time, patients are allowed 2 support persons to accompany them. Face masks are required for you and your support persons. They are now allowed to be there with you during the entire time of your admission.   Please contact the office if you have any questions regarding this information.  The [facility] office number is [phone].     Thank you,    Your [facility] Providers    Labor Induction Labor induction is when steps are taken to cause a pregnant woman to begin the labor process. Most women go into labor on their own between 37 weeks and 42 weeks of pregnancy. When this does not happen, or when there is a medical needfor labor to begin, steps may be taken to induce, or bring on, labor. Labor induction  causes a pregnant woman's uterus to contract. It also causes the cervix to soften (ripen), open (dilate), and thin out. Usually, labor is not induced before 39 weeks of pregnancyunless there is a medical reason to do so. When is labor induction considered? Labor induction may be right for you if: Your pregnancy lasts longer than 41 to 42 weeks. Your placenta is separating from your uterus (placental abruption). You have a rupture of membranes and your labor does not begin. You have health problems, like diabetes or high blood pressure (preeclampsia) during your pregnancy. Your baby has stopped growing or does not have enough amniotic fluid. Before labor induction begins, your health care provider will consider the following factors: Your medical condition and the baby's condition. How many weeks you have been pregnant. How mature the baby's lungs are. The condition of your cervix. The position of the baby. The size of your birth canal. Tell a health care provider about: Any allergies you have. All medicines you are taking, including vitamins, herbs, eye drops, creams, and over-the-counter medicines. Any problems you or your family members have had with anesthetic medicines. Any surgeries you have had. Any blood disorders you have. Any medical conditions you have. What are the risks? Generally, this is a safe procedure. However, problems may occur, including: Failed induction. Changes in fetal heart rate, such as being too high, too low, or irregular (erratic). Infection in the mother or the baby. Increased risk of having  a cesarean delivery. Breaking off (abruption) of the placenta from the uterus. This is rare. Rupture of the uterus. This is very rare. Your baby could fail to get enough blood flow or oxygen. This can be life-threatening. When induction is needed for medical reasons, the benefits generally outweighthe risks. What happens during the procedure? During the procedure,  your health care provider will use one of these methods to induce labor: Stripping the membranes. In this method, the amniotic sac tissue is gently separated from the cervix. This causes the following to happen: Your cervix stretches, which in turn causes the release of prostaglandins. Prostaglandins induce labor and cause the uterus to contract. This procedure is often done in an office visit. You will be sent home to wait for contractions to begin. Prostaglandin medicine. This medicine starts contractions and causes the cervix to dilate and ripen. This can be taken by mouth (orally) or by being inserted into the vagina (suppository). Inserting a small, thin tube (catheter) with a balloon into the vagina and then expanding the balloon with water to dilate the cervix. Breaking the water. In this method, a small instrument is used to make a small hole in the amniotic sac. This eventually causes the amniotic sac to break. Contractions should begin within a few hours. Medicine to trigger or strengthen contractions. This medicine is given through an IV that is inserted into a vein in your arm. This procedure may vary among health care providers and hospitals. Where to find more information March of [employer]: www.marchofdimes.org [employer] of Obstetricians and Gynecologists: www.acog.org Summary Labor induction causes a pregnant woman's uterus to contract. It also causes the cervix to soften (ripen), open (dilate), and thin out. Labor is usually not induced before 39 weeks of pregnancy unless there is a medical reason to do so. When induction is needed for medical reasons, the benefits generally outweigh the risks. Talk with your health care provider about which methods of labor induction are right for you. This information is not intended to replace advice given to you by your health care provider. Make sure you discuss any questions you have with your healthcare provider. Document Revised:  08/17/2020 Document Reviewed: 08/17/2020 Elsevier Patient Education  [place].

## 2021-05-10 NOTE — Progress Notes (Signed)
ROB: Patient with no major complaints today.  Is no longer working. Discussed IOL procedure, scheduled for 05/15/21 at 0500.  Discussed need for COVID testing. Will return on 6/27 for outpatient foley bulb placement. NST performed today was reviewed and was found to be reactive.  Continue recommended antenatal testing and prenatal care.    NONSTRESS TEST INTERPRETATION  INDICATIONS:  Post-dates pregnancy  FHR baseline: 140 bpm RESULTS:Reactive COMMENTS: irregular contractions (not detectable to patient)   PLAN: 1. Continue fetal kick counts twice a day. 2. Continue antepartum testing as scheduled-weekly 3. Return for foley bulb placement on 6/27 prior to induction.

## 2021-05-11 ENCOUNTER — Other Ambulatory Visit: Payer: Self-pay | Admitting: Obstetrics and Gynecology

## 2021-05-11 ENCOUNTER — Other Ambulatory Visit
Admission: RE | Admit: 2021-05-11 | Discharge: 2021-05-11 | Disposition: A | Discharge status: Home / Self Care | Payer: Federal, State, Local not specified - PPO | Source: Ambulatory Visit | Attending: Obstetrics and Gynecology | Admitting: Obstetrics and Gynecology

## 2021-05-11 DIAGNOSIS — Z01812 Encounter for preprocedural laboratory examination: Secondary | ICD-10-CM | POA: Diagnosis not present

## 2021-05-11 DIAGNOSIS — Z20822 Contact with and (suspected) exposure to covid-19: Secondary | ICD-10-CM | POA: Diagnosis not present

## 2021-05-11 LAB — SARS CORONAVIRUS 2 (TAT 6-24 HRS): SARS Coronavirus 2: NEGATIVE

## 2021-05-14 ENCOUNTER — Other Ambulatory Visit: Payer: Self-pay

## 2021-05-14 ENCOUNTER — Other Ambulatory Visit: Payer: Federal, State, Local not specified - PPO

## 2021-05-14 ENCOUNTER — Encounter: Payer: Self-pay | Admitting: Obstetrics and Gynecology

## 2021-05-14 ENCOUNTER — Ambulatory Visit (INDEPENDENT_AMBULATORY_CARE_PROVIDER_SITE_OTHER): Payer: Federal, State, Local not specified - PPO | Admitting: Obstetrics and Gynecology

## 2021-05-14 VITALS — BP 125/83 | HR 98 | Wt 191.0 lb

## 2021-05-14 DIAGNOSIS — O480 Post-term pregnancy: Secondary | ICD-10-CM | POA: Diagnosis not present

## 2021-05-14 DIAGNOSIS — Z3483 Encounter for supervision of other normal pregnancy, third trimester: Secondary | ICD-10-CM

## 2021-05-14 DIAGNOSIS — Z3A40 40 weeks gestation of pregnancy: Secondary | ICD-10-CM

## 2021-05-14 LAB — POCT URINALYSIS DIPSTICK OB
Bilirubin, UA: NEGATIVE
Blood, UA: NEGATIVE
Glucose, UA: NEGATIVE
Ketones, UA: NEGATIVE
Leukocytes, UA: NEGATIVE
Nitrite, UA: NEGATIVE
Spec Grav, UA: 1.025 (ref 1.010–1.025)
Urobilinogen, UA: 0.2 E.U./dL
pH, UA: 6 (ref 5.0–8.0)

## 2021-05-14 NOTE — Progress Notes (Signed)
OB-Pt present for routine prenatal care, NST and foley bulb.

## 2021-05-14 NOTE — Patient Instructions (Signed)

## 2021-05-14 NOTE — Progress Notes (Signed)
Patient presents for foley bulb placement. Doing well, no complaints. Denies contractions.    FOLEY BULB PROCEDURE NOTE:    I have verified that the patient is an appropriate candidate for outpatient foley bulb placement.  She has consented to foley bulb placement today. She has no concerns at this time. A pre-procedure NST performed today was reviewed and was found to be reactive.     The patient was placed in the dorsal lithotomy position.  A speculum was inserted into the vagina and the cervix was visualized.  The cervix was noted to be 0.5 cm dilated.  A foley catheter was advanced into the cervix slightly pass the internal cervical os.  The foley balloon was filled with 40 cc of normal saline.  Gentle traction was placed on the foley bulb, and the catheter was taped to the medial portion of the thigh.  The patient tolerated the procedure well.  A post-procedure NST was performed. Sterile technique was observed throughout.      NONSTRESS TEST INTERPRETATION  INDICATIONS: Foley bulb induction placement  FHR baseline: 135 bpm (pre-procedure) and 140  bpm (post procedure) RESULTS:Reactive x 2.  COMMENTS: Uterine irritability with occasional contraction noted.    PLAN: 1. To arrive at Labor and Delivery for scheduled induction of labor on 05/15/2021 at 5:00 a.m.  She was given post-procedure instructions.

## 2021-05-15 ENCOUNTER — Encounter: Payer: Self-pay | Admitting: Obstetrics and Gynecology

## 2021-05-15 ENCOUNTER — Inpatient Hospital Stay
Admission: RE | Admit: 2021-05-15 | Discharge: 2021-05-17 | DRG: 806 | Disposition: A | Discharge status: Home / Self Care | Payer: 59 | Attending: Obstetrics and Gynecology | Admitting: Obstetrics and Gynecology

## 2021-05-15 ENCOUNTER — Inpatient Hospital Stay: Payer: 59 | Admitting: Anesthesiology

## 2021-05-15 DIAGNOSIS — O9832 Other infections with a predominantly sexual mode of transmission complicating childbirth: Secondary | ICD-10-CM | POA: Diagnosis not present

## 2021-05-15 DIAGNOSIS — B009 Herpesviral infection, unspecified: Secondary | ICD-10-CM | POA: Diagnosis not present

## 2021-05-15 DIAGNOSIS — O9081 Anemia of the puerperium: Secondary | ICD-10-CM | POA: Diagnosis not present

## 2021-05-15 DIAGNOSIS — O98513 Other viral diseases complicating pregnancy, third trimester: Secondary | ICD-10-CM

## 2021-05-15 DIAGNOSIS — A6000 Herpesviral infection of urogenital system, unspecified: Secondary | ICD-10-CM | POA: Diagnosis present

## 2021-05-15 DIAGNOSIS — O480 Post-term pregnancy: Principal | ICD-10-CM | POA: Diagnosis present

## 2021-05-15 DIAGNOSIS — Z3490 Encounter for supervision of normal pregnancy, unspecified, unspecified trimester: Secondary | ICD-10-CM | POA: Diagnosis present

## 2021-05-15 DIAGNOSIS — Z3A40 40 weeks gestation of pregnancy: Secondary | ICD-10-CM | POA: Diagnosis not present

## 2021-05-15 DIAGNOSIS — O43193 Other malformation of placenta, third trimester: Secondary | ICD-10-CM | POA: Diagnosis not present

## 2021-05-15 LAB — CBC
HCT: 34.9 % — ABNORMAL LOW (ref 36.0–46.0)
Hemoglobin: 11.8 g/dL — ABNORMAL LOW (ref 12.0–15.0)
MCH: 28.6 pg (ref 26.0–34.0)
MCHC: 33.8 g/dL (ref 30.0–36.0)
MCV: 84.5 fL (ref 80.0–100.0)
Platelets: 198 10*3/uL (ref 150–400)
RBC: 4.13 MIL/uL (ref 3.87–5.11)
RDW: 13.9 % (ref 11.5–15.5)
WBC: 13.5 10*3/uL — ABNORMAL HIGH (ref 4.0–10.5)
nRBC: 0.1 % (ref 0.0–0.2)

## 2021-05-15 LAB — TYPE AND SCREEN
ABO/RH(D): O POS
Antibody Screen: NEGATIVE

## 2021-05-15 LAB — SYPHILIS: RPR W/REFLEX TO RPR TITER AND TREPONEMAL ANTIBODIES, TRADITIONAL SCREENING AND DIAGNOSIS ALGORITHM: RPR Ser Ql: NONREACTIVE

## 2021-05-15 MED ORDER — LIDOCAINE-EPINEPHRINE (PF) 1.5 %-1:200000 IJ SOLN
INTRAMUSCULAR | Status: DC | PRN
  Administered 2021-05-15: 3 mL via EPIDURAL

## 2021-05-15 MED ORDER — LACTATED RINGERS IV SOLN: 500.0000 mL | INTRAVENOUS | Status: DC | PRN

## 2021-05-15 MED ORDER — OXYTOCIN BOLUS FROM INFUSION
333.0000 mL | Freq: Once | INTRAVENOUS | Status: AC
  Administered 2021-05-15: 333 mL via INTRAVENOUS

## 2021-05-15 MED ORDER — FENTANYL 2.5 MCG/ML W/ROPIVACAINE 0.15% IN NS 100 ML EPIDURAL (ARMC)
EPIDURAL | Status: AC
  Filled 2021-05-15: qty 100

## 2021-05-15 MED ORDER — EPHEDRINE 5 MG/ML INJ: 10.0000 mg | INTRAVENOUS | Status: DC | PRN

## 2021-05-15 MED ORDER — FENTANYL 2.5 MCG/ML W/ROPIVACAINE 0.15% IN NS 100 ML EPIDURAL (ARMC)
12.0000 mL/h | EPIDURAL | Status: DC
Start: 2021-05-15 — End: 2021-05-15
  Administered 2021-05-15: 12 mL/h via EPIDURAL
  Filled 2021-05-15: qty 100

## 2021-05-15 MED ORDER — LIDOCAINE HCL (PF) 1 % IJ SOLN: 30.0000 mL | INTRAMUSCULAR | Status: DC | PRN

## 2021-05-15 MED ORDER — OXYTOCIN 10 UNIT/ML IJ SOLN
INTRAMUSCULAR | Status: AC
  Filled 2021-05-15: qty 2

## 2021-05-15 MED ORDER — OXYCODONE-ACETAMINOPHEN 5-325 MG PO TABS: 2.0000 | ORAL_TABLET | ORAL | Status: DC | PRN

## 2021-05-15 MED ORDER — ONDANSETRON HCL 4 MG/2ML IJ SOLN: 4.0000 mg | Freq: Four times a day (QID) | INTRAMUSCULAR | Status: DC | PRN

## 2021-05-15 MED ORDER — DIPHENHYDRAMINE HCL 50 MG/ML IJ SOLN
50.0000 mg | Freq: Once | INTRAMUSCULAR | Status: AC
  Administered 2021-05-15: 50 mg via INTRAVENOUS

## 2021-05-15 MED ORDER — WITCH HAZEL-GLYCERIN EX PADS
1.0000 "application " | MEDICATED_PAD | CUTANEOUS | Status: DC | PRN
  Administered 2021-05-15: 1 via TOPICAL
  Filled 2021-05-15 (×2): qty 100

## 2021-05-15 MED ORDER — LIDOCAINE HCL (PF) 1 % IJ SOLN
INTRAMUSCULAR | Status: DC | PRN
  Administered 2021-05-15 (×2): 3 mL via SUBCUTANEOUS

## 2021-05-15 MED ORDER — MISOPROSTOL 200 MCG PO TABS
ORAL_TABLET | ORAL | Status: AC
  Filled 2021-05-15: qty 4

## 2021-05-15 MED ORDER — TERBUTALINE SULFATE 1 MG/ML IJ SOLN: 0.2500 mg | Freq: Once | INTRAMUSCULAR | Status: DC | PRN

## 2021-05-15 MED ORDER — OXYTOCIN-SODIUM CHLORIDE 30-0.9 UT/500ML-% IV SOLN
1.0000 m[IU]/min | INTRAVENOUS | Status: DC
  Administered 2021-05-15: 4 m[IU]/min via INTRAVENOUS

## 2021-05-15 MED ORDER — SOD CITRATE-CITRIC ACID 500-334 MG/5ML PO SOLN: 30.0000 mL | ORAL | Status: DC | PRN

## 2021-05-15 MED ORDER — OXYTOCIN-SODIUM CHLORIDE 30-0.9 UT/500ML-% IV SOLN
2.5000 [IU]/h | INTRAVENOUS | Status: DC
  Administered 2021-05-15: 2.5 [IU]/h via INTRAVENOUS
  Filled 2021-05-15 (×2): qty 500

## 2021-05-15 MED ORDER — LIDOCAINE HCL (PF) 1 % IJ SOLN
INTRAMUSCULAR | Status: AC
  Filled 2021-05-15: qty 30

## 2021-05-15 MED ORDER — LACTATED RINGERS IV SOLN: INTRAVENOUS | Status: DC

## 2021-05-15 MED ORDER — OXYCODONE-ACETAMINOPHEN 5-325 MG PO TABS
1.0000 | ORAL_TABLET | ORAL | Status: DC | PRN
Start: 2021-05-15 — End: 2021-05-15

## 2021-05-15 MED ORDER — IBUPROFEN 600 MG PO TABS
600.0000 mg | ORAL_TABLET | Freq: Four times a day (QID) | ORAL | Status: DC
  Administered 2021-05-15 – 2021-05-16 (×3): 600 mg via ORAL
  Filled 2021-05-15 (×3): qty 1

## 2021-05-15 MED ORDER — LACTATED RINGERS IV SOLN: 500.0000 mL | Freq: Once | INTRAVENOUS | Status: DC

## 2021-05-15 MED ORDER — DIPHENHYDRAMINE HCL 50 MG/ML IJ SOLN
12.5000 mg | INTRAMUSCULAR | Status: DC | PRN
Start: 2021-05-15 — End: 2021-05-15
  Filled 2021-05-15: qty 1

## 2021-05-15 MED ORDER — MISOPROSTOL 50MCG HALF TABLET
50.0000 ug | ORAL_TABLET | Freq: Once | ORAL | Status: AC
  Administered 2021-05-15: 50 ug via VAGINAL
  Filled 2021-05-15: qty 1

## 2021-05-15 MED ORDER — AMMONIA AROMATIC IN INHA
RESPIRATORY_TRACT | Status: AC
  Filled 2021-05-15: qty 10

## 2021-05-15 MED ORDER — PHENYLEPHRINE 40 MCG/ML (10ML) SYRINGE FOR IV PUSH (FOR BLOOD PRESSURE SUPPORT): 80.0000 ug | PREFILLED_SYRINGE | INTRAVENOUS | Status: DC | PRN

## 2021-05-15 MED ORDER — DIPHENHYDRAMINE HCL 50 MG/ML IJ SOLN: 25.0000 mg | Freq: Once | INTRAMUSCULAR | Status: DC

## 2021-05-15 MED ORDER — DIBUCAINE (PERIANAL) 1 % EX OINT
1.0000 "application " | TOPICAL_OINTMENT | CUTANEOUS | Status: DC | PRN
  Filled 2021-05-15: qty 28

## 2021-05-15 MED ORDER — BENZOCAINE-MENTHOL 20-0.5 % EX AERO
1.0000 "application " | INHALATION_SPRAY | CUTANEOUS | Status: DC | PRN
  Administered 2021-05-15: 1 via TOPICAL
  Filled 2021-05-15: qty 56

## 2021-05-15 MED ORDER — ACETAMINOPHEN 325 MG PO TABS: 650.0000 mg | ORAL_TABLET | ORAL | Status: DC | PRN

## 2021-05-15 MED ORDER — FENTANYL 2.5 MCG/ML W/ROPIVACAINE 0.15% IN NS 100 ML EPIDURAL (ARMC)
EPIDURAL | Status: DC | PRN
  Administered 2021-05-15: 12 mL/h via EPIDURAL

## 2021-05-15 MED ORDER — BUPIVACAINE HCL (PF) 0.25 % IJ SOLN
INTRAMUSCULAR | Status: DC | PRN
  Administered 2021-05-15 (×2): 4 mL via EPIDURAL

## 2021-05-15 NOTE — Progress Notes (Deleted)
Obstetric History and Physical  Poppi I Deemer is a 25 y.o. G2P0010 with IUP at [redacted]w[redacted]d presenting for scheduled IOL. Patient states she has been having  occasional mild contractions, minimal vaginal bleeding, intact membranes, with active fetal movement.  Of note, patient had outpatient foley bulb placed yesterday evening in office. Reports it is still in place.   Prenatal Course Source of Care: [facility] with onset of care at 24 weeks (transfer from [place])  Pregnancy complications or risks: Patient Active Problem List   Diagnosis Date Noted   Post-dates pregnancy 05/15/2021   Vaginal discharge 04/18/2021   Vaginal discharge during pregnancy in third trimester 04/18/2021   Encounter for supervision of normal first pregnancy in third trimester    Prenatal care in second trimester 01/08/2021   History of miscarriage 06/27/2020   Human herpes simplex virus type 1 (HSV-1) DNA detected 12/03/2019   Yeast infection of the vagina 03/17/2018   She plans to breastfeed She desires  undeciced method  for postpartum contraception.   Prenatal labs and studies: ABO, Rh: --/--/O POS (06/28 0550) Antibody: NEG (06/28 0550) Rubella:   RPR: Non Reactive (04/07 1007)  HBsAg:    HIV: Non-reactive (12/01 0000)  GSU:PJSRPRXY/-- (05/27 1509) 1 hr Glucola  normal Genetic screening normal Anatomy US normal   Past Medical History:  Diagnosis Date   Chlamydia 07/2018   treated   Gonorrhea    12/15/2018, 09/2018.  Treated   History of COVID-19    HSV-1 (herpes simplex virus 1) infection    Serology postiive only. Has never had outbreak   Yeast vaginitis     Past Surgical History:  Procedure Laterality Date   DILATION AND EVACUATION N/A 04/13/2020   Procedure: DILATATION AND EVACUATION;  Surgeon: Rubie Maid, MD;  Location: [facility] ORS;  Service: Gynecology;  Laterality: N/A;   WISDOM TOOTH EXTRACTION  10/2016   all four;     OB History  Gravida Para Term Preterm AB Living  2  0 0 0 1 0  SAB IAB Ectopic Multiple Live Births  1 0 0 0 0    # Outcome Date GA Lbr Len/2nd Weight Sex Delivery Anes PTL Lv  2 Current           1 SAB 03/2020 [redacted]w[redacted]d       FD    Social History   Socioeconomic History   Marital status: Single    Spouse name: Not on file   Number of children: 0   Years of education: Not on file   Highest education level: Some college, no degree  Occupational History   Occupation: full time Ship broker    Occupation: works part time     Comment: Therapist, art as a host   Tobacco Use   Smoking status: Never   Smokeless tobacco: Never  Vaping Use   Vaping Use: Never used  Substance and Sexual Activity   Alcohol use: Not Currently    Alcohol/week: 1.0 standard drink    Types: 1 Standard drinks or equivalent per week   Drug use: No   Sexual activity: Not Currently    Partners: Male  Other Topics Concern   Not on file  Social History Narrative   She is going [employer] and is getting a degree in Designer, fashion/clothing    Social Determinants of Health   Financial Resource Strain: Not on file  Food Insecurity: Not on file  Transportation Needs: Not on file  Physical Activity: Not on file  Stress: Not on file  Social Connections: Not on file    Family History  Problem Relation Age of Onset   Hypertension Mother    Healthy Father    Cervical cancer Maternal Grandmother    Ovarian cancer Maternal Grandmother    Breast cancer Neg Hx    Colon cancer Neg Hx     Medications Prior to Admission  Medication Sig Dispense Refill Last Dose   Prenatal Vit-Fe Fumarate-FA (PRENATAL VITAMINS PO) Take by mouth.       valACYclovir (VALTREX) 500 MG tablet Take 1 tablet (500 mg total) by mouth 2 (two) times daily. 60 tablet 0     Allergies  Allergen Reactions   Flagyl [Metronidazole] Nausea And Vomiting    Review of Systems: Negative except for what is mentioned in HPI.  Physical Exam: LMP 08/05/2020  CONSTITUTIONAL: Well-developed, well-nourished  female in no acute distress.  HENT:  Normocephalic, atraumatic, External right and left ear normal. Oropharynx is clear and moist EYES: Conjunctivae and EOM are normal. Pupils are equal, round, and reactive to light. No scleral icterus.  NECK: Normal range of motion, supple, no masses SKIN: Skin is warm and dry. No rash noted. Not diaphoretic. No erythema. No pallor. NEUROLOGIC: Alert and oriented to person, place, and time. Normal reflexes, muscle tone coordination. No cranial nerve deficit noted. PSYCHIATRIC: Normal mood and affect. Normal behavior. Normal judgment and thought content. CARDIOVASCULAR: Normal heart rate noted, regular rhythm RESPIRATORY: Effort and breath sounds normal, no problems with respiration noted ABDOMEN: Soft, nontender, nondistended, gravid. MUSCULOSKELETAL: Normal range of motion. No edema and no tenderness. 2+ distal pulses.  Cervical Exam: Foley bulb removed. Dilatation external os 3-4 cm, with funneling, internal os 2.5-3 cm   Effacement 40-50%   Station ballotable   Presentation: cephalic FHT:  Baseline rate 130 bpm   Variability moderate  Accelerations present   Decelerations none Contractions: occasional    Pertinent Labs/Studies:   Results for orders placed or performed during the hospital encounter of 05/15/21 (from the past 24 hour(s))  CBC     Status: Abnormal   Collection Time: 05/15/21  5:50 AM  Result Value Ref Range   WBC 13.5 (H) 4.0 - 10.5 K/uL   RBC 4.13 3.87 - 5.11 MIL/uL   Hemoglobin 11.8 (L) 12.0 - 15.0 g/dL   HCT 34.9 (L) 36.0 - 46.0 %   MCV 84.5 80.0 - 100.0 fL   MCH 28.6 26.0 - 34.0 pg   MCHC 33.8 30.0 - 36.0 g/dL   RDW 13.9 11.5 - 15.5 %   Platelets 198 150 - 400 K/uL   nRBC 0.1 0.0 - 0.2 %  Type and screen     Status: None   Collection Time: 05/15/21  5:50 AM  Result Value Ref Range   ABO/RH(D) O POS    Antibody Screen NEG    Sample Expiration      05/18/2021,2359 Performed at [facility], [address],  [place], [place] [address]     Assessment : CORINNA BURKMAN is a 25 y.o. G2P0010 at [redacted]w[redacted]d being admitted for induction of labor due to post-dates pregnancy.  H/o HSV-1, on suppression therapy. Accessory lobe of placenta.   Plan: Labor: Induction as ordered as per protocol with Cytotec. Analgesia as needed. FWB: Reassuring fetal heart tracing.  GBS negative.  Delivery plan: Hopeful for vaginal delivery    Rubie Maid, MD [facility]

## 2021-05-15 NOTE — Anesthesia Preprocedure Evaluation (Signed)
Anesthesia Evaluation  Patient identified by MRN, date of birth, ID band Patient awake    Reviewed: Allergy & Precautions, H&P , NPO status , Patient's Chart, lab work & pertinent test results, reviewed documented beta blocker date and time   Airway Mallampati: III  TM Distance: >3 FB Neck ROM: full    Dental  (+) Teeth Intact   Pulmonary neg pulmonary ROS,    breath sounds clear to auscultation       Cardiovascular negative cardio ROS   Rhythm:regular Rate:Normal     Neuro/Psych  Headaches, negative psych ROS   GI/Hepatic Neg liver ROS, GERD  Medicated and Controlled,  Endo/Other  negative endocrine ROS  Renal/GU negative Renal ROS     Musculoskeletal   Abdominal   Peds  Hematology   Anesthesia Other Findings Past Medical History: 07/2018: Chlamydia     Comment:  treated 09/2018: Gonorrhea     Comment:  treated 12/15/2018: History of gonorrhea No date: Yeast vaginitis Past Surgical History: 10/2016: WISDOM TOOTH EXTRACTION     Comment:  all four;    Reproductive/Obstetrics negative OB ROS                             Anesthesia Physical  Anesthesia Plan  ASA: 2  Anesthesia Plan: Epidural   Post-op Pain Management:    Induction: Intravenous  PONV Risk Score and Plan: Ondansetron and Treatment may vary due to age or medical condition  Airway Management Planned: Oral ETT  Additional Equipment:   Intra-op Plan:   Post-operative Plan: Extubation in OR  Informed Consent: I have reviewed the patients History and Physical, chart, labs and discussed the procedure including the risks, benefits and alternatives for the proposed anesthesia with the patient or authorized representative who has indicated his/her understanding and acceptance.     Dental Advisory Given  Plan Discussed with: CRNA and Anesthesiologist  Anesthesia Plan Comments:         Anesthesia Quick  Evaluation

## 2021-05-15 NOTE — Anesthesia Procedure Notes (Signed)
Epidural Patient location during procedure: OB Start time: 05/15/2021 1:30 PM End time: 05/15/2021 1:48 PM  Staffing Anesthesiologist: Emmie Niemann, MD Resident/CRNA: Aline Brochure, CRNA Performed: resident/CRNA   Preanesthetic Checklist Completed: patient identified, IV checked, site marked, risks and benefits discussed, surgical consent, monitors and equipment checked, pre-op evaluation and timeout performed  Epidural Patient position: sitting Prep: ChloraPrep Patient monitoring: heart rate, continuous pulse ox and blood pressure Approach: midline Location: L3-L4 Injection technique: LOR saline  Needle:  Needle type: Tuohy  Needle gauge: 17 G Needle length: 9 cm and 9 Needle insertion depth: 8 cm Catheter type: closed end flexible Catheter size: 19 Gauge Catheter at skin depth: 13 cm Test dose: negative and 1.5% lidocaine with Epi 1:200 K  Assessment Events: blood not aspirated, injection not painful, no injection resistance, no paresthesia and negative IV test  Additional Notes 1 attempt Pt. Evaluated and documentation done after procedure finished. Patient identified. Risks/Benefits/Options discussed with patient including but not limited to bleeding, infection, nerve damage, paralysis, failed block, incomplete pain control, headache, blood pressure changes, nausea, vomiting, reactions to medication both or allergic, itching and postpartum back pain. Confirmed with bedside nurse the patient's most recent platelet count. Confirmed with patient that they are not currently taking any anticoagulation, have any bleeding history or any family history of bleeding disorders. Patient expressed understanding and wished to proceed. All questions were answered. Sterile technique was used throughout the entire procedure. Please see nursing notes for vital signs. Test dose was given through epidural catheter and negative prior to continuing to dose epidural or start infusion. Warning  signs of high block given to the patient including shortness of breath, tingling/numbness in hands, complete motor block, or any concerning symptoms with instructions to call for help. Patient was given instructions on fall risk and not to get out of bed. All questions and concerns addressed with instructions to call with any issues or inadequate analgesia.    Patient tolerated the insertion well without immediate complications.Reason for block:procedure for pain

## 2021-05-15 NOTE — Progress Notes (Signed)
Intrapartum Progress Note  S: Patient reports her water broke approximately 5 minutes ago.    O: Blood pressure 115/72, pulse 83, temperature 98.3 F (36.8 C), temperature source Oral, resp. rate 16, height 4\' 11"  (1.499 m), weight 86.6 kg, last menstrual period 08/05/2020. Gen App: NAD, comfortable Abdomen: soft, gravid FHT: baseline 155 bpm.  Accels present.  Decels present - intermittent late deceleration (shallow/subtle) . moderate in degree variability.   Tocometer: contractions q 2-4 minutes Cervix: 4/70/-3 to -2 (at 11:30) Extremities: Nontender, no edema.  Pitocin: 2 mIU (decreased from 5 mIU due to tetanic contractions with deceleration)  Labs: No new labs   Assessment:  1: SIUP at [redacted]w[redacted]d 2. Category II tracing 3. SROM (clear fluid)  Plan:  1. Pitocin lowered, will continue to monitor and titrate slowly).  2. Anticipate vaginal delivery   Rubie Maid, MD [facility]

## 2021-05-15 NOTE — Progress Notes (Signed)
Intrapartum Progress Note  S: Patient doing well, no complaints. Is s/p epidural.   O: Blood pressure 114/75, pulse 81, temperature 98.8 F (37.1 C), temperature source Oral, resp. rate 16, height 4\' 11"  (1.499 m), weight 86.6 kg, last menstrual period 08/05/2020, SpO2 99 %. Gen App: NAD, comfortable Abdomen: soft, gravid FHT: baseline 150 bpm.  Accels present.  Decels present - 1 variable deceleration with position change after cervical check . moderate in degree variability (previous period of minimal variability).   Tocometer: contractions q 2-3 minutes Cervix: 6.5/90/0 to +1 Extremities: Nontender, no edema.  Pitocin: 14 mIU  Labs:  No new labs  Assessment:  1: SIUP at [redacted]w[redacted]d   Plan:  1. Continue IOL with Pitocin.   2. Anticipate vaginal delivery soon.    Rubie Maid, MD 05/15/2021 6:12 PM

## 2021-05-16 LAB — CBC
HCT: 29.3 % — ABNORMAL LOW (ref 36.0–46.0)
Hemoglobin: 9.7 g/dL — ABNORMAL LOW (ref 12.0–15.0)
MCH: 28.1 pg (ref 26.0–34.0)
MCHC: 33.1 g/dL (ref 30.0–36.0)
MCV: 84.9 fL (ref 80.0–100.0)
Platelets: 172 10*3/uL (ref 150–400)
RBC: 3.45 MIL/uL — ABNORMAL LOW (ref 3.87–5.11)
RDW: 14.1 % (ref 11.5–15.5)
WBC: 17.1 10*3/uL — ABNORMAL HIGH (ref 4.0–10.5)
nRBC: 0 % (ref 0.0–0.2)

## 2021-05-16 MED ORDER — ZOLPIDEM TARTRATE 5 MG PO TABS: 5.0000 mg | ORAL_TABLET | Freq: Every evening | ORAL | Status: DC | PRN

## 2021-05-16 MED ORDER — COCONUT OIL OIL
1.0000 | TOPICAL_OIL | Status: DC | PRN
Start: 2021-05-16 — End: 2021-05-17

## 2021-05-16 MED ORDER — SIMETHICONE 80 MG PO CHEW: 80.0000 mg | CHEWABLE_TABLET | ORAL | Status: DC | PRN

## 2021-05-16 MED ORDER — ONDANSETRON HCL 4 MG/2ML IJ SOLN: 4.0000 mg | INTRAMUSCULAR | Status: DC | PRN

## 2021-05-16 MED ORDER — IBUPROFEN 600 MG PO TABS
600.0000 mg | ORAL_TABLET | Freq: Four times a day (QID) | ORAL | Status: DC
  Administered 2021-05-17: 600 mg via ORAL
  Filled 2021-05-16: qty 1

## 2021-05-16 MED ORDER — ONDANSETRON HCL 4 MG PO TABS: 4.0000 mg | ORAL_TABLET | ORAL | Status: DC | PRN

## 2021-05-16 MED ORDER — PRENATAL MULTIVITAMIN CH
1.0000 | ORAL_TABLET | Freq: Every day | ORAL | Status: DC
  Administered 2021-05-16: 1 via ORAL
  Filled 2021-05-16: qty 1

## 2021-05-16 MED ORDER — SENNOSIDES-DOCUSATE SODIUM 8.6-50 MG PO TABS
2.0000 | ORAL_TABLET | Freq: Every day | ORAL | Status: DC
  Administered 2021-05-16: 2 via ORAL
  Filled 2021-05-16: qty 2

## 2021-05-16 MED ORDER — DIPHENHYDRAMINE HCL 25 MG PO CAPS: 25.0000 mg | ORAL_CAPSULE | Freq: Four times a day (QID) | ORAL | Status: DC | PRN

## 2021-05-16 NOTE — Lactation Note (Signed)
This note was copied from a baby's chart. Lactation Consultation Note  Patient Name: Samantha Ramsey Date: 05/16/2021 Reason for consult: Initial assessment;Primapara;Term Age:23 hours  Initial lactation visit. Mom is G2P1 SVD 18 hours ago. 2-3 documented feeds with also an emesis episode.   Sylvia worked with parents to help find comfortable position. Mom prefers cradle/cross-cradle hold, support pillows added, mom taught how to position hands and use 1 hand to sandwich for deep latch. Baby is able to grasp breast easily, he was a little sleepy so strategies given and demonstrated to keep him active.  Mom reports feeling more comfortable with this hold, and verbalizes understanding on how to keep him awake.  Reviewed newborn stomach size, feeding patterns and behaviors, early hunger cues, 8-12 attempts in first 24 hours, hand expression, and output expectations.  Whiteboard updated with LC name/number, encouraged to call with questions/concerns and ongoing BF support.  Maternal Data Has patient been taught Hand Expression?: Yes Does the patient have breastfeeding experience prior to this delivery?: No  Feeding Mother's Current Feeding Choice: Breast Milk  LATCH Score Latch: Grasps breast easily, tongue down, lips flanged, rhythmical sucking.  Audible Swallowing: A few with stimulation  Type of Nipple: Everted at rest and after stimulation  Comfort (Breast/Nipple): Soft / non-tender  Hold (Positioning): Assistance needed to correctly position infant at breast and maintain latch. (tried new position)  LATCH Score: 8   Lactation Tools Discussed/Used    Interventions Interventions: Breast feeding basics reviewed;Assisted with latch;Hand express;Adjust position;Support pillows;Position options;Education  Discharge    Consult Status Consult Status: Follow-up Date: 05/17/21 Follow-up type: [facility] 05/16/2021, 3:55 PM

## 2021-05-16 NOTE — Progress Notes (Signed)
Post Partum Day # 1, s/p SVD  Subjective: no complaints, up ad lib, voiding, and tolerating PO  Objective: Temp:  [98 F (36.7 C)-98.8 F (37.1 C)] 98 F (36.7 C) (06/29 1147) Pulse Rate:  [74-123] 99 (06/29 1147) Resp:  [14-20] 18 (06/29 1147) BP: (97-132)/(44-81) 124/81 (06/29 1147) SpO2:  [98 %-100 %] 99 % (06/29 0818)  Physical Exam:  General: alert and no distress  Lungs: clear to auscultation bilaterally Breasts: normal appearance, no masses or tenderness Heart: regular rate and rhythm, S1, S2 normal, no murmur, click, rub or gallop Abdomen: soft, non-tender; bowel sounds normal; no masses,  no organomegaly Pelvis: Lochia: appropriate, Uterine Fundus: firm Extremities: DVT Evaluation: No evidence of DVT seen on physical exam. Negative Homan's sign. No cords or calf tenderness. No significant calf/ankle edema.   Recent Labs    05/15/21 0550 05/16/21 0630  HGB 11.8* 9.7*  HCT 34.9* 29.3*    Assessment/Plan: Doing well postpartum.  Breastfeeding, Lactation consult Circumcision prior to discharge Contraception undecided. Mild anemia postpartum, asymptomatic. Will treat with PO iron supplementation.  Plan for discharge tomorrow.   LOS: 1 day   Rubie Maid, MD [facility] 05/16/2021 12:49 PM

## 2021-05-16 NOTE — Anesthesia Postprocedure Evaluation (Signed)
Anesthesia Post Note  Patient: Samantha Ramsey  Procedure(s) Performed: AN AD HOC LABOR EPIDURAL  Patient location during evaluation: Mother Baby Anesthesia Type: Epidural Level of consciousness: awake and alert Pain management: pain level controlled Vital Signs Assessment: post-procedure vital signs reviewed and stable Respiratory status: spontaneous breathing, nonlabored ventilation and respiratory function stable Cardiovascular status: stable Postop Assessment: no headache, no backache and epidural receding Anesthetic complications: no   No notable events documented.   Last Vitals:  Vitals:   05/16/21 0359 05/16/21 0818  BP: 110/70 107/65  Pulse: 77 75  Resp: 18 20  Temp: 37 C 36.7 C  SpO2: 100% 99%    Last Pain:  Vitals:   05/16/21 0830  TempSrc:   PainSc: 0-No pain                 Chloe Flis Lorenza Chick

## 2021-05-17 MED ORDER — IBUPROFEN 600 MG PO TABS: 600.0000 mg | ORAL_TABLET | Freq: Four times a day (QID) | ORAL | 0 refills | Status: DC | PRN

## 2021-05-17 MED ORDER — FERROUS SULFATE 325 (65 FE) MG PO TABS
325.0000 mg | ORAL_TABLET | Freq: Every day | ORAL | 0 refills | Status: DC
Start: 2021-05-17 — End: 2022-01-09

## 2021-05-17 NOTE — Lactation Note (Signed)
This note was copied from a baby's chart. Lactation Consultation Note  Patient Name: Samantha Ramsey CLEXN'T Date: 05/17/2021 Reason for consult: Follow-up assessment;Primapara;Term Age:23 hours  Lactation follow-up prior to anticipated discharge. Mom reports cluster feeding overnight, some L nipple pain/tenderness, and has questions re: baby getting enough.  [facility] reviewed with parents signs of adequate intake and transfer: %wt loss of HOL, output, bili levels. Reviewed body language before/after feedings, and encouraged skin to skin and hand expression.  Baby going for circumcision. Anticipatory guidance given for typical feeding patterns and behaviors following procedure.  Information given for breastfeeding expectations in days/weeks to come: growth spurts/cluster feeding, output expectations, feeding on demand, early cues, signs of contentment/adequate intake. Referred parents to the newborn booklet for additional information.  Guidance given on anticipated breast changes, breast fullness and engorgement and management of both and nipple care.  Outpatient lactation services information given, and community breastfeeding support given.  Encouraged to call out with questions/concerns or for BF support.   Maternal Data Has patient been taught Hand Expression?: Yes Does the patient have breastfeeding experience prior to this delivery?: No  Feeding Mother's Current Feeding Choice: Breast Milk  LATCH Score Latch:  (baby previously ate around 0830)                  Lactation Tools Discussed/Used    Interventions Interventions: Breast feeding basics reviewed;Education;Support pillows;Hand express  Discharge Discharge Education: Engorgement and breast care;Warning signs for feeding baby;Outpatient recommendation  Consult Status Consult Status: Complete Date: 05/17/21 Follow-up type: Call as needed    Lavonia Drafts 05/17/2021, 10:05 AM

## 2021-05-17 NOTE — Discharge Summary (Signed)
Postpartum Discharge Summary      Patient Name: Samantha Ramsey DOB: 03-12-1996 MRN: [id number]  Date of admission: 05/15/2021 Delivery date:05/15/2021  Delivering provider: Rubie Maid  Date of discharge: 05/17/2021  Admitting diagnosis: Post-dates pregnancy [O48.0] Intrauterine pregnancy: [redacted]w[redacted]d    Secondary diagnosis:  Principal Problem:   Post-dates pregnancy Active Problems:   Human herpes simplex virus type 1 (HSV-1) DNA detected   Encounter for induction of labor  Additional problems: None    Discharge diagnosis: Term Pregnancy Delivered and Anemia                                              Post partum procedures: None Augmentation: Pitocin, Cytotec, and OP Foley Complications: None  Hospital course: Induction of Labor With Vaginal Delivery   25y.o. yo G2P1011 at 485w5das admitted to the hospital 05/15/2021 for induction of labor.  Indication for induction: Postdates.  Patient had an uncomplicated labor course as follows: Membrane Rupture Time/Date: 12:00 PM ,05/15/2021   Delivery Method:Vaginal, Spontaneous  Episiotomy: None  Lacerations:  None  Details of delivery can be found in separate delivery note.  Patient had a routine postpartum course. Patient is discharged home 05/17/21.  Newborn Data: Birth date:05/15/2021  Birth time:9:36 PM  Gender:Female  Living status:Living  Apgars:8 ,9  Weight:3250 g   Magnesium Sulfate received: No BMZ received: No Rhophylac:No MMR:No T-DaP:Given prenatally Flu: No (declined) Transfusion:No  Physical exam  Vitals:   05/16/21 1147 05/16/21 1922 05/16/21 2311 05/17/21 0731  BP: 124/81 122/72 (!) 116/54 111/74  Pulse: 99 96 87 76  Resp: _0 Temp: 98 F (36.7 C) 98.9 F (37.2 C) 98.8 F (37.1 C) 98 F (36.7 C)  TempSrc: Oral Oral Oral Oral  SpO2:  99% 99% 99%  Weight:      Height:       General: alert, cooperative, and no distress Lochia: appropriate Uterine Fundus: firm Incision: Healing well with  no significant drainage DVT Evaluation: No evidence of DVT seen on physical exam. Negative Homan's sign. No cords or calf tenderness. No significant calf/ankle edema.   Labs: Lab Results  Component Value Date   WBC 17.1 (H) 05/16/2021   HGB 9.7 (L) 05/16/2021   HCT 29.3 (L) 05/16/2021   MCV 84.9 05/16/2021   PLT 172 05/16/2021   CMP Latest Ref Rng & Units 12/01/2019  Glucose 65 - 99 mg/dL 91  BUN 6 - 20 mg/dL 14  Creatinine 0.57 - 1.00 mg/dL 0.71  Sodium 134 - 144 mmol/L 138  Potassium 3.5 - 5.2 mmol/L 3.8  Chloride 96 - 106 mmol/L 104  CO2 20 - 29 mmol/L 21  Calcium 8.7 - 10.2 mg/dL 9.0  Total Protein 6.0 - 8.5 g/dL 7.0  Total Bilirubin 0.0 - 1.2 mg/dL <0.2  Alkaline Phos 39 - 117 IU/L 54  AST 0 - 40 IU/L 21  ALT 0 - 32 IU/L 14   Edinburgh Score: No flowsheet data found.    After visit meds:  Allergies as of 05/17/2021       Reactions   Flagyl [metronidazole] Nausea And Vomiting        Medication List     STOP taking these medications    valACYclovir 500 MG tablet Commonly known as: VALTREX       TAKE these medications  ferrous sulfate 325 (65 FE) MG tablet Commonly known as: FerrouSul Take 1 tablet (325 mg total) by mouth daily with breakfast.   ibuprofen 600 MG tablet Commonly known as: ADVIL Take 1 tablet (600 mg total) by mouth every 6 (six) hours as needed for mild pain or cramping.   PRENATAL VITAMINS PO Take by mouth.         Discharge home in stable condition Infant Feeding: Breast Infant Disposition:home with mother Discharge instruction: per After Visit Summary and Postpartum booklet. Activity: Advance as tolerated. Pelvic rest for 6 weeks.  Diet: routine diet Anticipated Birth Control: Unsure Postpartum Appointment:6 weeks Additional Postpartum F/U: Postpartum Depression checkup Future Appointments: Future Appointments  Date Time Provider [place]  05/29/2021  4:15 PM Rubie Maid, MD [facility] None   Follow up  Visit:  Follow-up Information     Rubie Maid, MD Follow up today.   Specialties: Obstetrics and Gynecology, Radiology Why: 2 week televisit postpartum mood check 6 week postpartum visit Contact information: [address] [place]  [phone]                     05/17/2021 Rubie Maid, MD [facility]

## 2021-05-17 NOTE — Progress Notes (Signed)
Pt discharged with infant. Discharge instructions, prescriptions, and follow up appointments given to and reviewed with patient. Pt verbalized understanding. To be escorted out by auxillary.

## 2021-05-22 NOTE — H&P (Signed)
Obstetric History and Physical  Samantha Ramsey is a 25 y.o. G2P0010 with IUP at [redacted]w[redacted]d presenting for scheduled IOL. Patient states she has been having  occasional mild contractions, minimal vaginal bleeding, intact membranes, with active fetal movement.  Of note, patient had outpatient foley bulb placed yesterday evening in office. Reports it is still in place.   Prenatal Course Source of Care: [facility] with onset of care at 24 weeks (transfer from [place])  Pregnancy complications or risks: Patient Active Problem List   Diagnosis Date Noted   Post-dates pregnancy 05/15/2021   Encounter for induction of labor 05/15/2021   Vaginal discharge 04/18/2021   Vaginal discharge during pregnancy in third trimester 04/18/2021   Encounter for supervision of normal first pregnancy in third trimester    Prenatal care in second trimester 01/08/2021   History of miscarriage 06/27/2020   Human herpes simplex virus type 1 (HSV-1) DNA detected 12/03/2019   Yeast infection of the vagina 03/17/2018   She plans to breastfeed She desires  undeciced method  for postpartum contraception.   Prenatal labs and studies: ABO, Rh: --/--/O POS (06/28 0550) Antibody: NEG (06/28 0550) Rubella:   RPR: NON REACTIVE (06/28 0550)  HBsAg:    HIV: Non-reactive (12/01 0000)  QHU:TMLYYTKP/-- (05/27 1509) 1 hr Glucola  normal Genetic screening normal Anatomy US normal   Past Medical History:  Diagnosis Date   Chlamydia 07/2018   treated   Gonorrhea    12/15/2018, 09/2018.  Treated   History of COVID-19    HSV-1 (herpes simplex virus 1) infection    Serology postiive only. Has never had outbreak   Yeast vaginitis     Past Surgical History:  Procedure Laterality Date   DILATION AND EVACUATION N/A 04/13/2020   Procedure: DILATATION AND EVACUATION;  Surgeon: Rubie Maid, MD;  Location: [facility] ORS;  Service: Gynecology;  Laterality: N/A;   WISDOM TOOTH EXTRACTION  10/2016   all four;     OB  History  Gravida Para Term Preterm AB Living  2 1 1  0 1 1  SAB IAB Ectopic Multiple Live Births  1 0 0 0 1    # Outcome Date GA Lbr Len/2nd Weight Sex Delivery Anes PTL Lv  2 Term 05/15/21 [redacted]w[redacted]d 07:46 / 00:50 3250 g M Vag-Spont EPI  LIV  1 SAB 03/2020 [redacted]w[redacted]d       FD    Social History   Socioeconomic History   Marital status: Single    Spouse name: Not on file   Number of children: 0   Years of education: Not on file   Highest education level: Some college, no degree  Occupational History   Occupation: full time Ship broker    Occupation: works part time     Comment: Therapist, art as a host   Tobacco Use   Smoking status: Never   Smokeless tobacco: Never  Vaping Use   Vaping Use: Never used  Substance and Sexual Activity   Alcohol use: Not Currently    Alcohol/week: 1.0 standard drink    Types: 1 Standard drinks or equivalent per week   Drug use: No   Sexual activity: Not Currently    Partners: Male  Other Topics Concern   Not on file  Social History Narrative   She is going [employer] and is getting a degree in Designer, fashion/clothing    Social Determinants of Radio broadcast assistant Strain: Not on file  Food Insecurity: Not on file  Transportation  Needs: Not on file  Physical Activity: Not on file  Stress: Not on file  Social Connections: Not on file    Family History  Problem Relation Age of Onset   Hypertension Mother    Healthy Father    Cervical cancer Maternal Grandmother    Ovarian cancer Maternal Grandmother    Breast cancer Neg Hx    Colon cancer Neg Hx     No medications prior to admission.    Allergies  Allergen Reactions   Flagyl [Metronidazole] Nausea And Vomiting    Review of Systems: Negative except for what is mentioned in HPI.  Physical Exam: BP 111/74 (BP Location: Right Arm)   Pulse 76   Temp 98 F (36.7 C) (Oral)   Resp 15   Ht 4\' 11"  (1.499 m)   Wt 86.6 kg   LMP 08/05/2020   SpO2 99%   Breastfeeding Unknown   BMI 38.58  kg/m  CONSTITUTIONAL: Well-developed, well-nourished female in no acute distress.  HENT:  Normocephalic, atraumatic, External right and left ear normal. Oropharynx is clear and moist EYES: Conjunctivae and EOM are normal. Pupils are equal, round, and reactive to light. No scleral icterus.  NECK: Normal range of motion, supple, no masses SKIN: Skin is warm and dry. No rash noted. Not diaphoretic. No erythema. No pallor. NEUROLOGIC: Alert and oriented to person, place, and time. Normal reflexes, muscle tone coordination. No cranial nerve deficit noted. PSYCHIATRIC: Normal mood and affect. Normal behavior. Normal judgment and thought content. CARDIOVASCULAR: Normal heart rate noted, regular rhythm RESPIRATORY: Effort and breath sounds normal, no problems with respiration noted ABDOMEN: Soft, nontender, nondistended, gravid. MUSCULOSKELETAL: Normal range of motion. No edema and no tenderness. 2+ distal pulses.  Cervical Exam: Foley bulb removed. Dilatation external os 3-4 cm, with funneling, internal os 2.5-3 cm   Effacement 40-50%   Station ballotable   Presentation: cephalic FHT:  Baseline rate 130 bpm   Variability moderate  Accelerations present   Decelerations none Contractions: occasional    Pertinent Labs/Studies:   No results found for this or any previous visit (from the past 24 hour(s)).   Assessment : Samantha Ramsey is a 25 y.o. G2P0010 at [redacted]w[redacted]d being admitted for induction of labor due to post-dates pregnancy.  H/o HSV-1, on suppression therapy. Accessory lobe of placenta.   Plan: Labor: Induction as ordered as per protocol with Cytotec. Analgesia as needed. FWB: Reassuring fetal heart tracing.  GBS negative.  Delivery plan: Hopeful for vaginal delivery    Rubie Maid, MD [facility]

## 2021-05-29 ENCOUNTER — Encounter: Payer: Self-pay | Admitting: Obstetrics and Gynecology

## 2021-05-29 ENCOUNTER — Ambulatory Visit (INDEPENDENT_AMBULATORY_CARE_PROVIDER_SITE_OTHER): Payer: Federal, State, Local not specified - PPO | Admitting: Obstetrics and Gynecology

## 2021-05-29 ENCOUNTER — Other Ambulatory Visit: Payer: Self-pay

## 2021-05-29 NOTE — Progress Notes (Signed)
Televisit-pt having visit for mood check and postpartum care. Pt provided wt. Pt stated that she was breastfeeding without any problems. Pt was thinking about getting an IUD during postpartum visit. No sex since the birth of the baby. EPDS=1.

## 2021-05-29 NOTE — Progress Notes (Signed)
Virtual Visit via Telephone Note  I connected with Samantha Ramsey on 05/29/21 at  4:15 PM EDT by telephone and verified that I am speaking with the correct person using two identifiers.  Location: Patient: Home Provider: Office   I discussed the limitations, risks, security and privacy concerns of performing an evaluation and management service by telephone and the availability of in person appointments. I also discussed with the patient that there may be a patient responsible charge related to this service. The patient expressed understanding and agreed to proceed.   History of Present Illness: Samantha Ramsey is a 25 y.o. G37P1011 female who presents virtually for 2 week postpartum follow up and mood check.  She reports overall is doing well, breastfeeding is going well. Currently breasfeeding q 2 hrs.  Has not resumed sexual intercourse. Bleeding is slowing down.  Desires IUD for contraception.  Does note some occasional episodes of tearfulness, but overall notes good mood. Has good support at home. Edinburgh Postpartum Depression Score is 1.     Observations/Objective:  Height 4\' 11"  (1.499 m), weight 172 lb (78 kg), currently breastfeeding.  Body mass index is 34.74 kg/m. Gen App: alert, no apparent distress Psych: normal mood and affect, normal speech and thought    Edinburgh Postnatal Depression Scale - 05/29/21 1623       Edinburgh Postnatal Depression Scale:  In the Past 7 Days   I have been able to laugh and see the funny side of things. 0    I have looked forward with enjoyment to things. 0    I have blamed myself unnecessarily when things went wrong. 0    I have been anxious or worried for no good reason. 0    I have felt scared or panicky for no good reason. 0    Things have been getting on top of me. 0    I have been so unhappy that I have had difficulty sleeping. 0    I have felt sad or miserable. 0    I have been so unhappy that I have been crying. 1    The thought  of harming myself has occurred to me. 0    Edinburgh Postnatal Depression Scale Total 1              Assessment and Plan:  Postpartum state - overall doing well, to f/u in 4 weeks for final postpartum visit.  Lactating mother - discussed watching for feeding cues, may become exhausted feeding q 2 hrs. Advised that she can stretch to q 3 hrs. Thinks she has good milk supply.   Follow Up Instructions:   Follow up in 4 weeks for final postpartum visit.   I discussed the assessment and treatment plan with the patient. The patient was provided an opportunity to ask questions and all were answered. The patient agreed with the plan and demonstrated an understanding of the instructions.   The patient was advised to call back or seek an in-person evaluation if the symptoms worsen or if the condition fails to improve as anticipated.  I provided 7 minutes of non-face-to-face time during this encounter.   Rubie Maid, MD [facility]

## 2021-06-20 ENCOUNTER — Encounter: Payer: Federal, State, Local not specified - PPO | Admitting: Obstetrics and Gynecology

## 2021-07-03 ENCOUNTER — Encounter: Payer: Self-pay | Admitting: Family Medicine

## 2021-07-03 ENCOUNTER — Other Ambulatory Visit: Payer: Self-pay

## 2021-07-03 ENCOUNTER — Ambulatory Visit (INDEPENDENT_AMBULATORY_CARE_PROVIDER_SITE_OTHER): Payer: 59 | Admitting: Family Medicine

## 2021-07-03 VITALS — BP 122/68 | Temp 99.2°F | Ht 59.0 in | Wt 163.8 lb

## 2021-07-03 DIAGNOSIS — M7632 Iliotibial band syndrome, left leg: Secondary | ICD-10-CM | POA: Diagnosis not present

## 2021-07-03 NOTE — Progress Notes (Signed)
    SUBJECTIVE:   CHIEF COMPLAINT / HPI:   LEG PAIN - 3 weeks - hurts to put weight on it. - no injury Quality:  aching Location:  L leg. Starts in anterior thigh, moves down leg Bilateral:  no Onset: gradual Frequency: constant Time of  day:    worse at night Paresthesias:   yes Decreased sensation:  yes Weakness:   yes Bowel/bladder incontinence: no Fever: no Redness: no Swelling: unsure Alleviating factors: no Aggravating factors: walking Status: worse Treatments attempted: none Never happened before.  No rashes.   OBJECTIVE:   BP 122/68   Temp 99.2 F (37.3 C) (Oral)   Ht '4\' 11"'$  (1.499 m)   Wt 163 lb 12.8 oz (74.3 kg)   SpO2 98%   BMI 33.08 kg/m   Gen: well appearing, in NAD MSK: L lower leg Inspection: no asymmetry, no rashes, swelling, redness. ROM: full AROM of knee, hip Palpation: tenderness over IT band. NonTTP over piriformis. Strength: 5/5 LE bilaterally Stability: no joint laxity Special Tests: negative Lachmans, ant/post drawer. Negative FABER/FADIR. Neurovascular: intact patellar and achilles reflexes.  Bedside US with L thickened IT band compared to R. No L knee effusions, bilateral menisci intact, no tendious pathology appreciated.   ASSESSMENT/PLAN:   Iliotibial band syndrome of left side Recommend NSAIDs, stretching. Handout provided. F/u if no better.      Myles Gip, DO

## 2021-07-03 NOTE — Assessment & Plan Note (Signed)
Recommend NSAIDs, stretching. Handout provided. F/u if no better.

## 2021-07-03 NOTE — Patient Instructions (Signed)
It was great to see you!  Our plans for today:  - Try the stretches below. - Take ibuprofen as needed for pain. - Come back if no better after a few weeks.   Take care and seek immediate care sooner if you develop any concerns.   Dr. Ky Barban      Iliotibial Band Syndrome  Iliotibial band syndrome is a condition that often causes knee pain. It can also cause pain in the outside of the hip, thigh, and knee. The iliotibial band is a strip of tissue in each of the legs. This band runs from the outside ofthe hip and down the thigh to the outside of the knee. Repeatedly bending and straightening your knee can irritate your iliotibialband. What are the causes? This condition is caused by inflammation from rubbing (friction) of the iliotibial band as it moves over the thigh bone (femur) when you bend and straighten your knee again and again. What increases the risk? You are more likely to develop this condition if: You change elevation often while running on a treadmill. You run very long distances. You recently increased the length or intensity of your workouts. Intensity means how much effort you put in. You run downhill often, or you just started running downhill. You ride a bike very far or often. You may also be at greater risk if: You start a new workout routine without first warming up your muscles. You have a job that requires you to bend, squat, or climb often. What are the signs or symptoms? Symptoms of this condition include: Pain along the outside of your knee that may be worse with activity, especially running or going up and down stairs. A feeling like a snap over your knee or hip. Swelling on the outside of your knee. Pain or a feeling of tightness in your hip. How is this diagnosed? This condition is diagnosed based on: Your symptoms. Your medical history. A physical exam. You may also see a health care provider who specializes in reducing pain and improving movement  (physical therapist). A physical therapist may do an exam to check your balance, movement, and way of walking or running (gait) to see whether the way you move could add to your injury. You may also havetests to measure your strength, flexibility, and range of motion. How is this treated? This condition may be treated by: Taking NSAIDs, such as ibuprofen, to help relieve pain and swelling. Resting and limiting exercise. Returning to activities gradually. Doing exercises to improve movement and strength (physical therapy) as told by your health care provider. Having an injection of steroid medicine. This is medicine that helps relieve inflammation. Having surgery. This may be done if your symptoms do not improve after other treatments. Follow these instructions at home: Managing pain, stiffness, and swelling If directed, put ice on the injured area. To do this: Put ice in a plastic bag. Place a towel between your skin and the bag. Leave the ice on for 20 minutes, 2-3 times a day. Remove the ice if your skin turns bright red. This is very important. If you cannot feel pain, heat, or cold, you have a greater risk of damage to the area.  Activity Return to your normal activities as told by your health care provider. Ask your health care provider what activities are safe for you. Include low-impact activities, such as swimming, in your exercise routine. Check with your health care provider to make sure running is safe for you. Do exercises as  told by your health care provider. General instructions Take over-the-counter and prescription medicines only as told by your health care provider. Make sure you wear shoes that fit well and have good cushioning and arch support. Keep all follow-up visits. This is important. How is this prevented? Warm up and stretch before being active. Cool down and stretch after being active. Give your body time to rest between periods of activity. Consider getting  help from a coach or trainer to come up with a safe running plan and a plan to advance (progress) your training that fits your goals and ability. Change directions often while running around a track. Replace your shoes when the soles are worn out. Maintain physical fitness. This includes strength and flexibility. Contact a health care provider if: Your pain does not improve. Your pain gets worse even with treatment. Summary Iliotibial band syndrome is a condition that often causes knee pain. It can also cause pain in the outside of your hip, thigh, and knee. Treatment includes taking NSAIDs, resting, returning to activities gradually, and doing physical therapy exercises. Return to your normal activities as told by your health care provider. Ask your health care provider what activities are safe for you. This information is not intended to replace advice given to you by your health care provider. Make sure you discuss any questions you have with your healthcare provider. Document Revised: 03/06/2020 Document Reviewed: 03/06/2020 Elsevier Patient Education  [place].

## 2021-07-10 ENCOUNTER — Other Ambulatory Visit: Payer: Self-pay

## 2021-07-10 ENCOUNTER — Encounter: Payer: Self-pay | Admitting: Obstetrics and Gynecology

## 2021-07-10 ENCOUNTER — Ambulatory Visit (INDEPENDENT_AMBULATORY_CARE_PROVIDER_SITE_OTHER): Payer: 59 | Admitting: Obstetrics and Gynecology

## 2021-07-10 DIAGNOSIS — O9081 Anemia of the puerperium: Secondary | ICD-10-CM | POA: Diagnosis not present

## 2021-07-10 DIAGNOSIS — R87612 Low grade squamous intraepithelial lesion on cytologic smear of cervix (LGSIL): Secondary | ICD-10-CM | POA: Diagnosis not present

## 2021-07-10 DIAGNOSIS — Z30430 Encounter for insertion of intrauterine contraceptive device: Secondary | ICD-10-CM

## 2021-07-10 LAB — POCT URINE PREGNANCY: Preg Test, Ur: NEGATIVE

## 2021-07-10 NOTE — Patient Instructions (Signed)
IUD PLACEMENT POST-PROCEDURE INSTRUCTIONS  You may take Ibuprofen, Aleve or Tylenol for pain if needed.  Cramping should resolve within in 24 hours.  You may have a small amount of spotting.  You should wear a mini pad for the next few days.  You may have intercourse after 24 hours.  If you using this for birth control, it is effective immediately.  You need to call if you have any pelvic pain, fever, heavy bleeding or foul smelling vaginal discharge.  Irregular bleeding is common the first several months after having an IUD placed. You do not need to call for this reason unless you are concerned.  Shower or bathe as normal  You should have a follow-up appointment in 4-8 weeks for a re-check to make sure you are not having any problems.     WHAT IS THE FOURTH TRIMESTER?  The fourth trimester is the 12 weeks following the birth of a newborn. In these first few months of your baby's life, it's an important time to create a bond with them. It's also a period of adjustment as your baby adapts to life outside of the womb.  Why Is It Called the Fourth Trimester? The first trimester of pregnancy is from 1 to 14 weeks, the second trimester is from 14 to 28 weeks, and the third trimester is from 28 weeks until your baby is born. The fourth trimester includes the first weeks after you've given birth. This is the time when both you and your baby adjust to life after delivery.  The term fourth trimester was coined in 2002 by pediatrician Sunday Spillers, MD. He claimed that you should try to recreate the kind of environment that your baby had while still in the womb. There are several ways that you can do this.  Skin-to-skin contact. To help recreate what your baby's life in the womb was like, you and your partner can share skin-to-skin contact with your baby. This way, your baby can feel your heartbeat and the warmth of your skin, which are both comforting and familiar to them. You can also get this  contact with your baby during breastfeeding.   Swaddling and moving. While in the womb, your baby was in a small, confined space. You can recreate this sense of safety and security for them by swaddling. Studies show that babies may sleep better when swaddled. You can also get this same effect by carrying your baby in a sling close to your body.  Movement is also very comforting for your baby. Since they are used to the movement of your body from being in the womb, movement during the fourth trimester for infants is familiar to them.   What to Expect in the Fourth Trimester The fourth trimester is an important time for newborns. Typically, they have their first pediatric checkup during the first week after they're born. This doctor visit is important to monitor their health and development. Your baby's pediatrician will continue to closely monitor their well-being throughout the following weeks.  In these first months after birth, your baby is just learning how to use their senses to process the world around them. They are totally dependent on you to care for them and to understand their needs. It's during this time that your baby will likely learn how to start doing some things independently, like:  Making noises to communicate Holding their head up without help Keeping their attention on objects and follow them Using their muscles Smiling  Your baby has  a lot of adjusting to do outside of the womb. Their brains are taking in new sensations like tastes, smells, and sounds. During this time, it's important to follow their cues when it comes to sleeping, crying, and feeding.  In the fourth trimester, babies need 14 to 17 hours of sleep each day, even though their sleep schedule won't be predictable. After the fourth trimester, your baby may start to sleep through the night. They will also need to be fed every 2 to 3 hours because their little body is growing so quickly.  When it comes to  crying, you can try swaddling or rocking your baby to create that familiar environment for them. Other times, your baby will cry because they are hungry, need a diaper change, or simply want to be held by you.  Tips for Mom For moms, you'll notice that the fourth trimester is a period of great change for you, too. Before delivery, the mother's health is monitored quite closely. After birth, the focus usually shifts from you to the health of your baby. But it's just as important that new mothers get good postpartum care, too.   Your body is adjusting from pregnancy to healing from birth. Some common side effects that you might feel from childbirth include:  Changes in hormones Swelling Postpartum bleeding A general feeling of discomfort  These effects, plus the lack of sleep, can cause you to feel stress or anxiety, or to have mood swings. It's important to talk to your doctor about how you feel after giving birth and to not be afraid to ask for help when you need it.  To help yourself stay well during the 4th trimester, make sure you:  Eat a nutritious, balanced diet. Try to get rest when your schedule and your baby's schedule allow. Check in with your doctor or midwife if you experience pain or feel unwell. Talk to your doctor if you feel like you might have symptoms of postpartum depression.  [email or web address]

## 2021-07-10 NOTE — Progress Notes (Signed)
OBSTETRICS POSTPARTUM CLINIC PROGRESS NOTE  Subjective:     Samantha Ramsey is a 25 y.o. G54P1011 female who presents for a postpartum visit. She is 8 weeks postpartum following a spontaneous vaginal delivery. I have fully reviewed the prenatal and intrapartum course. The delivery was at 42w5dgestational weeks.  Anesthesia: epidural. Postpartum course has been well. Baby's course has been well. Baby is feeding by breast. Bleeding: patient has not resumed menses, with No LMP recorded.. Bowel function is normal. Bladder function is normal. Patient is sexually active, has been using condoms every time. Contraception method desired is IUD. Postpartum depression screening: negative.  EDPS score is 0.    The following portions of the patient's history were reviewed and updated as appropriate: allergies, current medications, past family history, past medical history, past social history, past surgical history, and problem list.  Review of Systems Pertinent items noted in HPI and remainder of comprehensive ROS otherwise negative.   Objective:    BP 124/76 (BP Location: Left Arm, Patient Position: Sitting, Cuff Size: Normal)   Pulse 77   Ht '4\' 11"'$  (1.499 m)   Wt 165 lb 4.8 oz (75 kg)   Breastfeeding Yes   BMI 33.39 kg/m   General:  alert and no distress   Breasts:  inspection negative, no nipple discharge or bleeding, no masses or nodularity palpable  Lungs: clear to auscultation bilaterally  Heart:  regular rate and rhythm, S1, S2 normal, no murmur, click, rub or gallop  Abdomen: soft, non-tender; bowel sounds normal; no masses,  no organomegaly.     Vulva:  normal  Vagina: normal vagina, no discharge, exudate, lesion, or erythema  Cervix:  no cervical motion tenderness and no lesions  Corpus: normal size, contour, position, consistency, mobility, non-tender  Adnexa:  normal adnexa and no mass, fullness, tenderness  Rectal Exam: Not performed.         Labs:  Lab Results  Component  Value Date   HGB 9.7 (L) 05/16/2021     Assessment:   1. Postpartum care following vaginal delivery   2. Encounter for IUD insertion   3. Anemia, postpartum      Plan:    1. Contraception: IUD. Discussed different options for IUD, would like Kyleena. Inserted today (see procedure note below).  2. Will check Hgb for h/o postpartum anemia of less than 10.  3. Follow up in: 3-6 months for annual exam.     GYNECOLOGY OFFICE PROCEDURE NOTE  Ori I PGeraldsis a 25y.o. G2P1011 here for KParksideIUD insertion. No GYN concerns.  Last pap smear was on 12/08/2019 and was LGSIL.  IUD Insertion Procedure Note Patient identified, informed consent performed, consent signed.   Discussed risks of irregular bleeding, cramping, infection, malpositioning or misplacement of the IUD outside the uterus which may require further procedure such as laparoscopy. Also discussed >99% contraception efficacy, increased risk of ectopic pregnancy with failure of method.   Emphasized that this did not protect against STIs, condoms recommended during all sexual encounters. Time out was performed.  Urine pregnancy test negative.  Speculum placed in the vagina.  Cervix visualized.  Cleaned with Betadine x 2.  Grasped anteriorly with a single tooth tenaculum.  Uterus sounded to 8 cm.  Kyleena IUD placed per manufacturer's recommendations.  Strings trimmed to 3 cm. Tenaculum was removed, good hemostasis noted.  Patient tolerated procedure well.   Patient was given post-procedure instructions.  She was advised to have backup contraception for one week.  Patient was also asked to check IUD strings periodically and follow up in 4 weeks for IUD check and repeat pap smear.  Lot: [id number] Exp: Feb 2024  Rubie Maid, MD [facility]

## 2021-07-10 NOTE — Addendum Note (Signed)
Addended by: Edwyna Shell on: 07/10/2021 02:01 PM   Modules accepted: Orders

## 2021-07-11 LAB — HEMOGLOBIN AND HEMATOCRIT, BLOOD
Hematocrit: 42.4 % (ref 34.0–46.6)
Hemoglobin: 13.9 g/dL (ref 11.1–15.9)

## 2021-07-14 ENCOUNTER — Other Ambulatory Visit: Payer: Self-pay | Admitting: Obstetrics and Gynecology

## 2021-07-19 DIAGNOSIS — H35413 Lattice degeneration of retina, bilateral: Secondary | ICD-10-CM | POA: Diagnosis not present

## 2021-07-19 DIAGNOSIS — H5213 Myopia, bilateral: Secondary | ICD-10-CM | POA: Diagnosis not present

## 2021-07-19 DIAGNOSIS — H33323 Round hole, bilateral: Secondary | ICD-10-CM | POA: Diagnosis not present

## 2021-07-19 DIAGNOSIS — H31093 Other chorioretinal scars, bilateral: Secondary | ICD-10-CM | POA: Diagnosis not present

## 2021-08-14 ENCOUNTER — Other Ambulatory Visit: Payer: Self-pay

## 2021-08-14 ENCOUNTER — Ambulatory Visit (INDEPENDENT_AMBULATORY_CARE_PROVIDER_SITE_OTHER): Payer: 59 | Admitting: Obstetrics and Gynecology

## 2021-08-14 ENCOUNTER — Encounter: Payer: Self-pay | Admitting: Obstetrics and Gynecology

## 2021-08-14 VITALS — BP 119/82 | HR 87 | Ht 59.0 in | Wt 168.2 lb

## 2021-08-14 DIAGNOSIS — Z30431 Encounter for routine checking of intrauterine contraceptive device: Secondary | ICD-10-CM

## 2021-08-14 NOTE — Progress Notes (Signed)
    GYNECOLOGY OFFICE ENCOUNTER NOTE  History:  25 y.o. G2P1011 here today for today for IUD string check; Kyleena  IUD was placed  07/10/2021. No complaints about the IUD, no concerning side effects.  The following portions of the patient's history were reviewed and updated as appropriate: allergies, current medications, past family history, past medical history, past social history, past surgical history and problem list. Last pap smear on 10/2020 (performed in [place]), reports it was abnormal (however cannot recall exact results).  Also with history of abnormal pap smear 11/2019, was LGSIL.  Review of Systems:  Pertinent items are noted in HPI.  Objective:  Physical Exam Blood pressure 119/82, pulse 87, height 4\' 11"  (1.499 m), weight 168 lb 3.2 oz (76.3 kg), currently breastfeeding.  Body mass index is 33.97 kg/m.  CONSTITUTIONAL: Well-developed, well-nourished female in no acute distress.  NEUROLOGIC: Alert and oriented to person, place, and time. Normal reflexes, muscle tone coordination.  ABDOMEN: Soft, no distention noted.   PELVIC: Normal appearing external genitalia; normal appearing vaginal mucosa and cervix.  IUD strings visualized, about 3 cm in length outside cervix.  EXTREMITIES: Non-tender, no edema or cyanosis  Assessment & Plan:  - Patient to keep IUD in place for up to 5 years; can come in for removal if she desires pregnancy earlier or for any concerning side effects. - Follow up as scheduled for routine health maintenance, will repeat pap smear at annual exam in February.   Rubie Maid, MD [facility]

## 2021-08-20 ENCOUNTER — Other Ambulatory Visit: Payer: Self-pay

## 2021-10-03 ENCOUNTER — Ambulatory Visit: Payer: Self-pay | Admitting: *Deleted

## 2021-10-03 ENCOUNTER — Telehealth: Payer: Self-pay | Admitting: Obstetrics and Gynecology

## 2021-10-03 ENCOUNTER — Other Ambulatory Visit: Payer: Self-pay

## 2021-10-03 MED ORDER — OSELTAMIVIR PHOSPHATE 75 MG PO CAPS: 75.0000 mg | ORAL_CAPSULE | Freq: Two times a day (BID) | ORAL | 0 refills | Status: DC

## 2021-10-03 NOTE — Telephone Encounter (Signed)
Pt reports infant son tested positive for flu yesterday. States she spiked fever 4am this morning. Mother calling initially "Just to make sure I'm doing all the right things." Spoke with pt. Reports temp 101.0 prior to call. Reports 101.6 max. States "Goes down with tylenol, then 2 hours later starts back up."  ALso reports sore throat, headache, body aches, chills. States is staying hydrated, urine is not dark, urinating as usual. Home care advise given per protocol, pt verbalizes understanding.  Pt questioning if TheraFlu OK to take when breast feeding. Advised OB/GYN best resource, states will call.  Assured pt NT would route to practice for PCPs review.     Reason for Disposition  [1] Fever AND [2] no signs of serious infection or localizing symptoms (all other triage questions negative)    Pt's son positive for flu.  Answer Assessment - Initial Assessment Questions 1. TEMPERATURE: "What is the most recent temperature?"  "How was it measured?"      101.0 2. ONSET: "When did the fever start?"      4am 3. CHILLS: "Do you have chills?" If yes: "How bad are they?"  (e.g., none, mild, moderate, severe)   - NONE: no chills   - MILD: feeling cold   - MODERATE: feeling very cold, some shivering (feels better under a thick blanket)   - SEVERE: feeling extremely cold with shaking chills (general body shaking, rigors; even under a thick blanket)      Chills, sweating 4. OTHER SYMPTOMS: "Do you have any other symptoms besides the fever?"  (e.g., abdomen pain, cough, diarrhea, earache, headache, sore throat, urination pain)     Headache, sore throat, body aches 5. CAUSE: If there are no symptoms, ask: "What do you think is causing the fever?"      Flu 6. CONTACTS: "Does anyone else in the family have an infection?"     Son, infant positive flu 7. TREATMENT: "What have you done so far to treat this fever?" (e.g., medications)     Tylenol 8. IMMUNOCOMPROMISE: "Do you have of the following: diabetes,  HIV positive, splenectomy, cancer chemotherapy, chronic steroid treatment, transplant patient, etc."     no  Protocols used: Fever-A-AH

## 2021-10-03 NOTE — Telephone Encounter (Signed)
Spoke to Samantha Ramsey and Dr. Marcelline Mates concerning the Samantha Ramsey's symptoms. AC advised to send in medication for Samantha Ramsey. Please see mychart messages.

## 2021-10-03 NOTE — Telephone Encounter (Signed)
Pt is calling in stating that she is having flu like symptoms temp 101.9, coughing, congestion and cold and she would like to know what she can take due to her breast feeding.  Call was transferred to [facility].

## 2021-10-08 ENCOUNTER — Ambulatory Visit (INDEPENDENT_AMBULATORY_CARE_PROVIDER_SITE_OTHER): Payer: 59 | Admitting: Family Medicine

## 2021-10-08 ENCOUNTER — Other Ambulatory Visit: Payer: Self-pay

## 2021-10-08 ENCOUNTER — Encounter: Payer: Self-pay | Admitting: Family Medicine

## 2021-10-08 VITALS — BP 110/72 | HR 84 | Temp 97.8°F | Resp 16 | Ht 59.0 in | Wt 163.3 lb

## 2021-10-08 DIAGNOSIS — H9202 Otalgia, left ear: Secondary | ICD-10-CM | POA: Diagnosis not present

## 2021-10-08 DIAGNOSIS — H938X2 Other specified disorders of left ear: Secondary | ICD-10-CM

## 2021-10-08 MED ORDER — AMOXICILLIN-POT CLAVULANATE 875-125 MG PO TABS: 1.0000 | ORAL_TABLET | Freq: Two times a day (BID) | ORAL | 0 refills | Status: DC

## 2021-10-08 MED ORDER — CIPROFLOXACIN-DEXAMETHASONE 0.3-0.1 % OT SUSP: 4.0000 [drp] | Freq: Two times a day (BID) | OTIC | 0 refills | Status: AC

## 2021-10-08 NOTE — Progress Notes (Signed)
Patient ID: Samantha Ramsey, female    DOB: [birth year], 25 y.o.   MRN: [id number]  PCP: Steele Sizer, MD  Chief Complaint  Patient presents with   Ear Pain    Left ear x2 weeks.    Subjective:   Samantha Ramsey is a 25 y.o. female, presents to clinic with CC of the following:  HPI  Patient presents with several days of left ear pain and sensation of popping and decreased hearing.  She developed fever and flulike symptoms about 6 days ago and her symptoms resolved about 2 to 3 days later including some congestion and mild sore throat. She reports history of "blocked ears" as a child no history of tubes or recurrent ear infections or prior ENT consults or procedures. She has been irrigating her ear and states that she has been having wax come out up until yesterday she denies putting anything in her ear.  She denies any fever headache neck pain sore throat.  Patient Active Problem List   Diagnosis Date Noted   Iliotibial band syndrome of left side 07/03/2021   History of miscarriage 06/27/2020   LGSIL on Pap smear of cervix 12/08/2019   Human herpes simplex virus type 1 (HSV-1) DNA detected 12/03/2019   Yeast infection of the vagina 03/17/2018      Current Outpatient Medications:    amoxicillin-clavulanate (AUGMENTIN) 875-125 MG tablet, Take 1 tablet by mouth 2 (two) times daily., Disp: 20 tablet, Rfl: 0   ciprofloxacin-dexamethasone (CIPRODEX) OTIC suspension, Place 4 drops into the left ear 2 (two) times daily for 5 days., Disp: 7.5 mL, Rfl: 0   ferrous sulfate (FERROUSUL) 325 (65 FE) MG tablet, Take 1 tablet (325 mg total) by mouth daily with breakfast., Disp: 60 tablet, Rfl: 0   levonorgestrel (KYLEENA) 19.5 MG IUD, 1 each by Intrauterine route once. Inserted 07/10/2021, Disp: , Rfl:    Prenatal Vit-Fe Fumarate-FA (PRENATAL VITAMINS PO), Take by mouth. , Disp: , Rfl:    oseltamivir (TAMIFLU) 75 MG capsule, Take 1 capsule (75 mg total) by mouth 2 (two) times daily. (Patient  not taking: Reported on 10/08/2021), Disp: 14 capsule, Rfl: 0   Allergies  Allergen Reactions   Flagyl [Metronidazole] Nausea And Vomiting     Social History   Tobacco Use   Smoking status: Never   Smokeless tobacco: Never  Vaping Use   Vaping Use: Never used  Substance Use Topics   Alcohol use: Not Currently    Alcohol/week: 1.0 standard drink    Types: 1 Standard drinks or equivalent per week   Drug use: No      Chart Review Today: I personally reviewed active problem list, medication list, allergies, family history, social history, health maintenance, notes from last encounter, lab results, imaging with the patient/caregiver today.   Review of Systems  Constitutional: Negative.   HENT: Negative.    Eyes: Negative.   Respiratory: Negative.    Cardiovascular: Negative.   Gastrointestinal: Negative.   Endocrine: Negative.   Genitourinary: Negative.   Musculoskeletal: Negative.   Skin: Negative.   Allergic/Immunologic: Negative.   Neurological: Negative.   Hematological: Negative.   Psychiatric/Behavioral: Negative.    All other systems reviewed and are negative.     Objective:   Vitals:   10/08/21 1258  BP: 110/72  Pulse: 84  Resp: 16  Temp: 97.8 F (36.6 C)  TempSrc: Oral  SpO2: 99%  Weight: 163 lb 4.8 oz (74.1 kg)  Height: 4\' 11"  (1.499 m)  Body mass index is 32.98 kg/m.  Physical Exam Vitals and nursing note reviewed.  Constitutional:      General: She is not in acute distress.    Appearance: Normal appearance. She is normal weight. She is not ill-appearing, toxic-appearing or diaphoretic.  HENT:     Head: Normocephalic and atraumatic.     Jaw: There is normal jaw occlusion.     Right Ear: Hearing, ear canal and external ear normal. No drainage, swelling or tenderness. No middle ear effusion. No mastoid tenderness. Tympanic membrane is not injected, scarred, perforated, erythematous, retracted or bulging.     Left Ear: External ear normal.  Decreased hearing noted. Swelling and tenderness present. No mastoid tenderness.     Ears:     Comments: Left AUC - distal aspect occluded with pink to red edematous and erythematous tissue with some areas of blood, tender - not able to see around, TM occluded No visible wax TTP to left tragus No preauricular or postauricular lymphadenopathy    Nose: Mucosal edema, congestion and rhinorrhea present.     Right Turbinates: Enlarged and swollen. Not pale.     Left Turbinates: Enlarged and swollen. Not pale.     Right Sinus: No maxillary sinus tenderness or frontal sinus tenderness.     Left Sinus: No maxillary sinus tenderness or frontal sinus tenderness.     Comments: Diffusely erythematous nasal mucosa    Mouth/Throat:     Mouth: Mucous membranes are moist.     Pharynx: Oropharynx is clear. Uvula midline. No pharyngeal swelling, oropharyngeal exudate, posterior oropharyngeal erythema or uvula swelling.  Eyes:     General: No scleral icterus.       Right eye: No discharge.        Left eye: No discharge.     Conjunctiva/sclera: Conjunctivae normal.     Pupils: Pupils are equal, round, and reactive to light.  Cardiovascular:     Rate and Rhythm: Normal rate.  Pulmonary:     Effort: No respiratory distress.  Musculoskeletal:     Cervical back: Normal range of motion. No rigidity or tenderness.  Lymphadenopathy:     Cervical: No cervical adenopathy.  Skin:    General: Skin is warm.     Coloration: Skin is not jaundiced.     Findings: No erythema.  Neurological:     Mental Status: She is alert. Mental status is at baseline.  Psychiatric:        Mood and Affect: Mood normal.        Behavior: Behavior normal.     Results for orders placed or performed in visit on 07/10/21  Hemoglobin and hematocrit, blood  Result Value Ref Range   Hemoglobin 13.9 11.1 - 15.9 g/dL   Hematocrit 42.4 34.0 - 46.6 %  POCT urine pregnancy  Result Value Ref Range   Preg Test, Ur Negative Negative        Assessment & Plan:     ICD-10-CM   1. Acute otalgia, left  H92.02 Ambulatory referral to ENT    ciprofloxacin-dexamethasone (CIPRODEX) OTIC suspension    amoxicillin-clavulanate (AUGMENTIN) 875-125 MG tablet    2. Ear canal mass, left  H93.8X2 ciprofloxacin-dexamethasone (CIPRODEX) OTIC suspension    amoxicillin-clavulanate (AUGMENTIN) 875-125 MG tablet     Left canal is blocked by what appears to be edematous and erythematous tissue atypical of otitis externa cannot see around this enlarged swollen tissue some areas of scant bleeding, no evident cerumen impaction, hearing decreased significantly on the left  with only mild tragus tenderness. Will start with Ciprodex, patient can add Augmentin if any symptoms worsen but right now she appears good -nontoxic, afebrile -no concern for spreading or deep space infection. I had 2 other providers examined the patient with me -PA and NP in office today, NP agrees it appears to be swollen tissue, encouraged the patient to get in urgently with ENT this week for further evaluation and treatment. If she feels much better she can follow-up here for recheck or still follow-up with ENT because it just appears of abnormal?   Plan reviewed with the patient, medications reviewed with the patient.  Oral medications reviewed for breast-feeding considerations-patient may also wish to check with Dr. Marcelline Mates  f/up with ENT or here in the next 1-2 weeks for recheck   Delsa Grana, PA-C 10/08/21 1:35 PM

## 2021-10-10 ENCOUNTER — Other Ambulatory Visit: Payer: Self-pay | Admitting: Otolaryngology

## 2021-10-10 DIAGNOSIS — D485 Neoplasm of uncertain behavior of skin: Secondary | ICD-10-CM | POA: Diagnosis not present

## 2021-10-10 DIAGNOSIS — H938X2 Other specified disorders of left ear: Secondary | ICD-10-CM

## 2021-10-10 DIAGNOSIS — H9012 Conductive hearing loss, unilateral, left ear, with unrestricted hearing on the contralateral side: Secondary | ICD-10-CM | POA: Diagnosis not present

## 2021-10-18 ENCOUNTER — Ambulatory Visit
Admission: RE | Admit: 2021-10-18 | Discharge: 2021-10-18 | Disposition: A | Discharge status: Home / Self Care | Payer: 59 | Source: Ambulatory Visit | Attending: Otolaryngology | Admitting: Otolaryngology

## 2021-10-18 ENCOUNTER — Other Ambulatory Visit: Payer: Self-pay

## 2021-10-18 DIAGNOSIS — R220 Localized swelling, mass and lump, head: Secondary | ICD-10-CM | POA: Diagnosis not present

## 2021-10-18 DIAGNOSIS — H61892 Other specified disorders of left external ear: Secondary | ICD-10-CM | POA: Diagnosis not present

## 2021-10-18 DIAGNOSIS — H938X2 Other specified disorders of left ear: Secondary | ICD-10-CM | POA: Insufficient documentation

## 2021-10-22 DIAGNOSIS — H9012 Conductive hearing loss, unilateral, left ear, with unrestricted hearing on the contralateral side: Secondary | ICD-10-CM | POA: Diagnosis not present

## 2021-10-22 DIAGNOSIS — D485 Neoplasm of uncertain behavior of skin: Secondary | ICD-10-CM | POA: Diagnosis not present

## 2021-10-25 ENCOUNTER — Encounter: Payer: Self-pay | Admitting: Otolaryngology

## 2021-11-01 ENCOUNTER — Ambulatory Visit: Payer: 59 | Admitting: Anesthesiology

## 2021-11-01 ENCOUNTER — Encounter: Payer: Self-pay | Admitting: Otolaryngology

## 2021-11-01 ENCOUNTER — Ambulatory Visit
Admission: RE | Admit: 2021-11-01 | Discharge: 2021-11-01 | Disposition: A | Discharge status: Home / Self Care | Payer: 59 | Attending: Otolaryngology | Admitting: Otolaryngology

## 2021-11-01 ENCOUNTER — Encounter
Admission: RE | Disposition: A | Discharge status: Home / Self Care | Payer: Self-pay | Source: Home / Self Care | Attending: Otolaryngology

## 2021-11-01 DIAGNOSIS — D164 Benign neoplasm of bones of skull and face: Secondary | ICD-10-CM | POA: Diagnosis not present

## 2021-11-01 DIAGNOSIS — H938X2 Other specified disorders of left ear: Secondary | ICD-10-CM | POA: Diagnosis not present

## 2021-11-01 DIAGNOSIS — D210 Benign neoplasm of connective and other soft tissue of head, face and neck: Secondary | ICD-10-CM | POA: Insufficient documentation

## 2021-11-01 DIAGNOSIS — D2322 Other benign neoplasm of skin of left ear and external auricular canal: Secondary | ICD-10-CM | POA: Diagnosis not present

## 2021-11-01 HISTORY — PX: MINOR EXCISION EAR CANAL MASS: SHX6241

## 2021-11-01 HISTORY — DX: Presence of spectacles and contact lenses: Z97.3

## 2021-11-01 LAB — POCT PREGNANCY, URINE: Preg Test, Ur: NEGATIVE

## 2021-11-01 SURGERY — MINOR EXCISION EAR CANAL MASS
Anesthesia: General | Site: Ear | Laterality: Left

## 2021-11-01 MED ORDER — DEXMEDETOMIDINE (PRECEDEX) IN NS 20 MCG/5ML (4 MCG/ML) IV SYRINGE
PREFILLED_SYRINGE | INTRAVENOUS | Status: DC | PRN
  Administered 2021-11-01: 10 ug via INTRAVENOUS

## 2021-11-01 MED ORDER — SCOPOLAMINE 1 MG/3DAYS TD PT72
1.0000 | MEDICATED_PATCH | Freq: Once | TRANSDERMAL | Status: DC
  Administered 2021-11-01: 1.5 mg via TRANSDERMAL

## 2021-11-01 MED ORDER — LIDOCAINE HCL (CARDIAC) PF 100 MG/5ML IV SOSY
PREFILLED_SYRINGE | INTRAVENOUS | Status: DC | PRN
  Administered 2021-11-01: 50 mg via INTRATRACHEAL

## 2021-11-01 MED ORDER — PROMETHAZINE HCL 25 MG/ML IJ SOLN: 6.2500 mg | INTRAMUSCULAR | Status: DC | PRN

## 2021-11-01 MED ORDER — MIDAZOLAM HCL 5 MG/5ML IJ SOLN
INTRAMUSCULAR | Status: DC | PRN
  Administered 2021-11-01: 2 mg via INTRAVENOUS

## 2021-11-01 MED ORDER — SILVER NITRATE-POT NITRATE 75-25 % EX MISC
CUTANEOUS | Status: DC | PRN
  Administered 2021-11-01: 1 via TOPICAL

## 2021-11-01 MED ORDER — OXYCODONE HCL 5 MG/5ML PO SOLN: 5.0000 mg | Freq: Once | ORAL | Status: DC | PRN

## 2021-11-01 MED ORDER — FENTANYL CITRATE (PF) 100 MCG/2ML IJ SOLN
INTRAMUSCULAR | Status: DC | PRN
  Administered 2021-11-01: 50 ug via INTRAVENOUS

## 2021-11-01 MED ORDER — DEXAMETHASONE SODIUM PHOSPHATE 4 MG/ML IJ SOLN
INTRAMUSCULAR | Status: DC | PRN
  Administered 2021-11-01: 8 mg via INTRAVENOUS

## 2021-11-01 MED ORDER — OXYCODONE HCL 5 MG PO TABS: 5.0000 mg | ORAL_TABLET | Freq: Once | ORAL | Status: DC | PRN

## 2021-11-01 MED ORDER — PROPOFOL 10 MG/ML IV BOLUS
INTRAVENOUS | Status: DC | PRN
  Administered 2021-11-01: 200 mg via INTRAVENOUS

## 2021-11-01 MED ORDER — ONDANSETRON HCL 4 MG/2ML IJ SOLN
INTRAMUSCULAR | Status: DC | PRN
  Administered 2021-11-01: 4 mg via INTRAVENOUS

## 2021-11-01 MED ORDER — LACTATED RINGERS IV SOLN: INTRAVENOUS | Status: DC

## 2021-11-01 MED ORDER — HYDROMORPHONE HCL 1 MG/ML IJ SOLN: 0.2500 mg | INTRAMUSCULAR | Status: DC | PRN

## 2021-11-01 MED ORDER — GLYCOPYRROLATE 0.2 MG/ML IJ SOLN
INTRAMUSCULAR | Status: DC | PRN
  Administered 2021-11-01: .1 mg via INTRAVENOUS

## 2021-11-01 SURGICAL SUPPLY — 12 items
CORD BIP STRL DISP 12FT (MISCELLANEOUS) ×1 IMPLANT
ELECT REM PT RETURN 9FT ADLT (ELECTROSURGICAL) ×2
ELECTRODE REM PT RTRN 9FT ADLT (ELECTROSURGICAL) ×1 IMPLANT
GAUZE 4X4 16PLY ~~LOC~~+RFID DBL (SPONGE) ×1 IMPLANT
GLOVE SURG GAMMEX PI TX LF 7.5 (GLOVE) ×3 IMPLANT
KIT TURNOVER KIT A (KITS) ×2 IMPLANT
NS IRRIG 500ML POUR BTL (IV SOLUTION) ×2 IMPLANT
PACK TONSIL AND ADENOID CUSTOM (PACKS) ×1 IMPLANT
SOL PREP PVP 2OZ (MISCELLANEOUS) ×2
SOLUTION PREP PVP 2OZ (MISCELLANEOUS) ×1 IMPLANT
SPONGE KITTNER 5P (MISCELLANEOUS) ×2 IMPLANT
STRAP BODY AND KNEE 60X3 (MISCELLANEOUS) ×2 IMPLANT

## 2021-11-01 NOTE — Anesthesia Preprocedure Evaluation (Signed)
Anesthesia Evaluation  Patient identified by MRN, date of birth, ID band Patient awake    Reviewed: Allergy & Precautions, H&P , NPO status , Patient's Chart, lab work & pertinent test results, reviewed documented beta blocker date and time   Airway Mallampati: II  TM Distance: >3 FB Neck ROM: full    Dental no notable dental hx.    Pulmonary neg pulmonary ROS,    Pulmonary exam normal breath sounds clear to auscultation       Cardiovascular Exercise Tolerance: Good negative cardio ROS   Rhythm:regular Rate:Normal     Neuro/Psych negative neurological ROS  negative psych ROS   GI/Hepatic negative GI ROS, Neg liver ROS,   Endo/Other  negative endocrine ROS  Renal/GU negative Renal ROS  negative genitourinary   Musculoskeletal   Abdominal   Peds  Hematology negative hematology ROS (+)   Anesthesia Other Findings   Reproductive/Obstetrics negative OB ROS                             Anesthesia Physical Anesthesia Plan  ASA: 2  Anesthesia Plan: General   Post-op Pain Management:    Induction:   PONV Risk Score and Plan:   Airway Management Planned:   Additional Equipment:   Intra-op Plan:   Post-operative Plan:   Informed Consent: I have reviewed the patients History and Physical, chart, labs and discussed the procedure including the risks, benefits and alternatives for the proposed anesthesia with the patient or authorized representative who has indicated his/her understanding and acceptance.     Dental Advisory Given  Plan Discussed with: CRNA  Anesthesia Plan Comments:        Anesthesia Quick Evaluation  

## 2021-11-01 NOTE — Anesthesia Postprocedure Evaluation (Signed)
Anesthesia Post Note  Patient: Samantha Ramsey  Procedure(s) Performed: EXCISION LEFT EAR CANAL MASS (Left: Ear)     Patient location during evaluation: PACU Anesthesia Type: General Level of consciousness: awake and alert Pain management: pain level controlled Vital Signs Assessment: post-procedure vital signs reviewed and stable Respiratory status: spontaneous breathing, nonlabored ventilation and respiratory function stable Cardiovascular status: blood pressure returned to baseline and stable Postop Assessment: no apparent nausea or vomiting Anesthetic complications: no   No notable events documented.  April Manson

## 2021-11-01 NOTE — Transfer of Care (Signed)
Immediate Anesthesia Transfer of Care Note  Patient: Samantha Ramsey  Procedure(s) Performed: EXCISION LEFT EAR CANAL MASS (Left: Ear)  Patient Location: PACU  Anesthesia Type: General  Level of Consciousness: awake, alert  and patient cooperative  Airway and Oxygen Therapy: Patient Spontanous Breathing and Patient connected to supplemental oxygen  Post-op Assessment: Post-op Vital signs reviewed, Patient's Cardiovascular Status Stable, Respiratory Function Stable, Patent Airway and No signs of Nausea or vomiting  Post-op Vital Signs: Reviewed and stable  Complications: No notable events documented.

## 2021-11-01 NOTE — H&P (Signed)
H&P has been reviewed and patient reevaluated, no changes necessary. To be downloaded later.  

## 2021-11-01 NOTE — Anesthesia Procedure Notes (Signed)
Procedure Name: LMA Insertion Date/Time: 11/01/2021 10:02 AM Performed by: Mayme Genta, CRNA Pre-anesthesia Checklist: Patient identified, Emergency Drugs available, Suction available, Timeout performed and Patient being monitored Patient Re-evaluated:Patient Re-evaluated prior to induction Oxygen Delivery Method: Circle system utilized Preoxygenation: Pre-oxygenation with 100% oxygen Induction Type: IV induction LMA: LMA inserted LMA Size: 4.0 Number of attempts: 1 Placement Confirmation: positive ETCO2 and breath sounds checked- equal and bilateral Tube secured with: Tape

## 2021-11-01 NOTE — Op Note (Signed)
11/01/2021  10:21 AM    Nicola Police  [id number]   Pre-Op Dx: Left ear canal mass  Post-op Dx: Left ear canal mass attached superiorly to the cartilaginous portion of the ear canal  Proc: Excision of left ear canal mass  Surg:  Elon Alas Mercedez Boule  Anes:  GOT  EBL: Minimal  Comp: None  Findings: The mass is irregularly ovoid and very firm.  It appears to have a skin lining over it.  It had a small skin attachment superiorly.  It was just about filling the ear canal but you could see around it just barely.  It was mobile except for its attachment superiorly.  Procedure: The patient was given general anesthesia by oral endotracheal intubation.  Once the patient was asleep the head was turned to the right side to visualize the left ear canal under high-power microscope.  The mass had a slight coating of wax over it and some of this was suctioned away.  It appeared to be a skin lined sac but it was fairly firm.  I used a small curette and I could get all the way around it except for superiorly there was attachment to the canal wall.  It seemed mobile and hanging from this attachment.  I used a round knife to make a small incision in the superior skin of the ear canal where the attachment was.  This freed up the lesion so that it was then freely mobile in the ear canal.  I used a curette to get behind it then just pushing outward to be able to remove this.  Once it was removed it was very firm and irregular in shape but felt almost like it was bony in nature.  The ear canal looked very healthy and clear but had a small tear of the skin at the superior pole of the cartilaginous portion right next to where the bony cartilaginous junction he has.  The skin was oozing slightly but there is no other bleeding noted.  There is no inflammation in the ear canal and the eardrum looked totally normal.  This was all suctioned out.  The ear canal was flushed in suction again.  There is still a slight ooze  superiorly of the ear canal.  I used 1 silver nitrate stick and cauterized this slightly at the superior border where it was oozing.  This coagulated it well and any excess was suctioned clear.  Your canal was very clear and you could see the eardrum easily.  The eardrum and middle ear were healthy and clear.  There is no further bleeding in the ear canal remained clear.  The patient tolerated the procedure well.  She was awakened taken to the recovery room in satisfactory condition.  There were no operative complications.  The specimen was sent to pathology for permanent section.  Dispo:   To PACU to be discharged home  Plan: To follow-up in the office in a week or so when we hopefully will have the path report and to make sure the ear canal looks good.  I do not think she needs any eardrops and we will plan to keep the ear canal dry for the next week to allow this to completely heal over.  She can use some Tylenol or ibuprofen for pain.  Elon Alas [place] Duford  11/01/2021 10:21 AM

## 2021-11-02 ENCOUNTER — Encounter: Payer: Self-pay | Admitting: Otolaryngology

## 2021-11-05 LAB — SURGICAL PATHOLOGY

## 2021-12-24 ENCOUNTER — Encounter: Payer: Self-pay | Admitting: Obstetrics and Gynecology

## 2022-01-04 NOTE — Progress Notes (Signed)
Patient presents today for annual exam. IUD is in place. Notes symptoms of depression since November.   Shaundrea Carrigg L, CMA

## 2022-01-09 ENCOUNTER — Other Ambulatory Visit (HOSPITAL_COMMUNITY)
Admission: RE | Admit: 2022-01-09 | Discharge: 2022-01-09 | Disposition: A | Discharge status: Home / Self Care | Payer: 59 | Source: Ambulatory Visit | Attending: Obstetrics and Gynecology | Admitting: Obstetrics and Gynecology

## 2022-01-09 ENCOUNTER — Encounter: Payer: Self-pay | Admitting: Obstetrics and Gynecology

## 2022-01-09 ENCOUNTER — Other Ambulatory Visit: Payer: Self-pay

## 2022-01-09 ENCOUNTER — Ambulatory Visit (INDEPENDENT_AMBULATORY_CARE_PROVIDER_SITE_OTHER): Payer: 59 | Admitting: Obstetrics and Gynecology

## 2022-01-09 VITALS — BP 118/72 | HR 79 | Resp 16 | Ht 59.0 in | Wt 158.8 lb

## 2022-01-09 DIAGNOSIS — Z124 Encounter for screening for malignant neoplasm of cervix: Secondary | ICD-10-CM | POA: Diagnosis not present

## 2022-01-09 DIAGNOSIS — Z01419 Encounter for gynecological examination (general) (routine) without abnormal findings: Secondary | ICD-10-CM | POA: Insufficient documentation

## 2022-01-09 DIAGNOSIS — E669 Obesity, unspecified: Secondary | ICD-10-CM

## 2022-01-09 DIAGNOSIS — E785 Hyperlipidemia, unspecified: Secondary | ICD-10-CM

## 2022-01-09 DIAGNOSIS — F53 Postpartum depression: Secondary | ICD-10-CM

## 2022-01-09 DIAGNOSIS — Z8742 Personal history of other diseases of the female genital tract: Secondary | ICD-10-CM

## 2022-01-09 DIAGNOSIS — Z131 Encounter for screening for diabetes mellitus: Secondary | ICD-10-CM

## 2022-01-09 MED ORDER — SERTRALINE HCL 50 MG PO TABS: 50.0000 mg | ORAL_TABLET | Freq: Every day | ORAL | 1 refills | Status: DC

## 2022-01-09 NOTE — Patient Instructions (Addendum)
Preventive Care 21-24 Years Old, Female °Preventive care refers to lifestyle choices and visits with your health care provider that can promote health and wellness. Preventive care visits are also called wellness exams. °What can I expect for my preventive care visit? °Counseling °During your preventive care visit, your health care provider may ask about your: °Medical history, including: °Past medical problems. °Family medical history. °Pregnancy history. °Current health, including: °Menstrual cycle. °Method of birth control. °Emotional well-being. °Home life and relationship well-being. °Sexual activity and sexual health. °Lifestyle, including: °Alcohol, nicotine or tobacco, and drug use. °Access to firearms. °Diet, exercise, and sleep habits. °Work and work environment. °Sunscreen use. °Safety issues such as seatbelt and bike helmet use. °Physical exam °Your health care provider may check your: °Height and weight. These may be used to calculate your BMI (body mass index). BMI is a measurement that tells if you are at a healthy weight. °Waist circumference. This measures the distance around your waistline. This measurement also tells if you are at a healthy weight and may help predict your risk of certain diseases, such as type 2 diabetes and high blood pressure. °Heart rate and blood pressure. °Body temperature. °Skin for abnormal spots. °What immunizations do I need? °Vaccines are usually given at various ages, according to a schedule. Your health care provider will recommend vaccines for you based on your age, medical history, and lifestyle or other factors, such as travel or where you work. °What tests do I need? °Screening °Your health care provider may recommend screening tests for certain conditions. This may include: °Pelvic exam and Pap test. °Lipid and cholesterol levels. °Diabetes screening. This is done by checking your blood sugar (glucose) after you have not eaten for a while (fasting). °Hepatitis B  test. °Hepatitis C test. °HIV (human immunodeficiency virus) test. °STI (sexually transmitted infection) testing, if you are at risk. °BRCA-related cancer screening. This may be done if you have a family history of breast, ovarian, tubal, or peritoneal cancers. °Talk with your health care provider about your test results, treatment options, and if necessary, the need for more tests. °Follow these instructions at home: °Eating and drinking ° °Eat a healthy diet that includes fresh fruits and vegetables, whole grains, lean protein, and low-fat dairy products. °Take vitamin and mineral supplements as recommended by your health care provider. °Do not drink alcohol if: °Your health care provider tells you not to drink. °You are pregnant, may be pregnant, or are planning to become pregnant. °If you drink alcohol: °Limit how much you have to 0-1 drink a day. °Know how much alcohol is in your drink. In the [place] one drink equals one 12 oz bottle of beer (355 mL), one 5 oz glass of wine (148 mL), or one 1½ oz glass of hard liquor (44 mL). °Lifestyle °Brush your teeth every morning and night with fluoride toothpaste. Floss one time each day. °Exercise for at least 30 minutes 5 or more days each week. °Do not use any products that contain nicotine or tobacco. These products include cigarettes, chewing tobacco, and vaping devices, such as e-cigarettes. If you need help quitting, ask your health care provider. °Do not use drugs. °If you are sexually active, practice safe sex. Use a condom or other form of protection to prevent STIs. °If you do not wish to become pregnant, use a form of birth control. If you plan to become pregnant, see your health care provider for a prepregnancy visit. °Find healthy ways to manage stress, such as: °Meditation, yoga,   or listening to music. °Journaling. °Talking to a trusted person. °Spending time with friends and family. °Minimize exposure to UV radiation to reduce your risk of skin  cancer. °Safety °Always wear your seat belt while driving or riding in a vehicle. °Do not drive: °If you have been drinking alcohol. Do not ride with someone who has been drinking. °If you have been using any mind-altering substances or drugs. °While texting. °When you are tired or distracted. °Wear a helmet and other protective equipment during sports activities. °If you have firearms in your house, make sure you follow all gun safety procedures. °Seek help if you have been physically or sexually abused. °What's next? °Go to your health care provider once a year for an annual wellness visit. °Ask your health care provider how often you should have your eyes and teeth checked. °Stay up to date on all vaccines. °This information is not intended to replace advice given to you by your health care provider. Make sure you discuss any questions you have with your health care provider. °Document Revised: 05/02/2021 Document Reviewed: 05/02/2021 °Elsevier Patient Education © 2022 [employer] Inc[employer] ° °

## 2022-01-09 NOTE — Progress Notes (Signed)
GYNECOLOGY ANNUAL PHYSICAL EXAM PROGRESS NOTE  Subjective:    Samantha Ramsey is a 26 y.o. G29P1011 female who presents for an annual exam.  She is sexually active. The patient wears seatbelts: yes. The patient participates in regular exercise: no. Has the patient ever been transfused or tattooed?: no. The patient reports that there is not domestic violence in her life.    Samantha Ramsey has the following complaints today. Samantha Ramsey complains of depression symptoms.  Notes the symptoms have been going on for at least the past 2 to 3 months.  She complains that she has not been sleeping well, does not quite feel like herself, and has no motivation to do things that she used to enjoy doing.  Of note, patient is approximately 6 months postpartum, unsure if this may be related.  Other triggering factors include being laid off following work around the time that her symptoms initiated.  States that she has not been able to find a new job since then.  Mostly stays in the home with her child and does not really go out.  She reports that overall she has some family support.  She denies thoughts of harm to herself or others.    Gynecologic History Menarche age: 21 No LMP recorded (lmp unknown). (Menstrual status: IUD).   Contraception: Kyleena inserted 06/2022. History of STI's: History of Chlamydia and Gonorrhea in 2019.  Last Pap: 12/01/2019. Results were: LGSIL. No previous h/o abnormal pap smears.     OB History  Gravida Para Term Preterm AB Living  2 1 1  0 1 1  SAB IAB Ectopic Multiple Live Births  1 0 0 0 1    # Outcome Date GA Lbr Len/2nd Weight Sex Delivery Anes PTL Lv  2 Term 05/15/21 [redacted]w[redacted]d 07:46 / 00:50 7 lb 2.6 oz (3.25 kg) M Vag-Spont EPI  LIV     Name: Samantha Ramsey     Apgar1: 8  Apgar5: 9  1 SAB 03/2020 [redacted]w[redacted]d       FD    Past Medical History:  Diagnosis Date   Chlamydia 07/2018   treated   Gonorrhea    12/15/2018, 09/2018.  Treated   History of COVID-19    HSV-1 (herpes  simplex virus 1) infection    Serology postiive only. Has never had outbreak   Wears contact lenses    Yeast vaginitis     Past Surgical History:  Procedure Laterality Date   DILATION AND EVACUATION N/A 04/13/2020   Procedure: DILATATION AND EVACUATION;  Surgeon: Samantha Maid, MD;  Location: [facility];  Service: Gynecology;  Laterality: N/A;   MINOR EXCISION EAR CANAL MASS Left 11/01/2021   Procedure: EXCISION LEFT EAR CANAL MASS;  Surgeon: Samantha Sheffield, MD;  Location: [place];  Service: ENT;  Laterality: Left;   WISDOM TOOTH EXTRACTION  10/2016   all four;     Family History  Problem Relation Age of Onset   Hypertension Mother    Healthy Father    Cervical cancer Maternal Grandmother    Ovarian cancer Maternal Grandmother    Breast cancer Neg Hx    Colon cancer Neg Hx     Social History   Socioeconomic History   Marital status: Single    Spouse name: Not on file   Number of children: 0   Years of education: Not on file   Highest education level: Some college, no degree  Occupational History   Occupation: full time Ship broker    Occupation:  works part time     Comment: Therapist, art as a host   Tobacco Use   Smoking status: Never   Smokeless tobacco: Never  Vaping Use   Vaping Use: Never used  Substance and Sexual Activity   Alcohol use: Not Currently    Alcohol/week: 1.0 standard drink    Types: 1 Standard drinks or equivalent per week   Drug use: No   Sexual activity: Not Currently    Partners: Male    Birth control/protection: I.U.D.  Other Topics Concern   Not on file  Social History Narrative   She is going [employer] and is getting a degree in Designer, fashion/clothing    Social Determinants of Health   Financial Resource Strain: Not on file  Food Insecurity: Not on file  Transportation Needs: Not on file  Physical Activity: Not on file  Stress: Not on file  Social Connections: Not on file  Intimate Partner Violence: Not on file    Current  Outpatient Medications on File Prior to Visit  Medication Sig Dispense Refill   levonorgestrel (KYLEENA) 19.5 MG IUD 1 each by Intrauterine route once. Inserted 07/10/2021     No current facility-administered medications on file prior to visit.    Allergies  Allergen Reactions   Flagyl [Metronidazole] Nausea And Vomiting      Review of Systems Constitutional: negative for chills, fatigue, fevers and sweats Eyes: negative for irritation, redness and visual disturbance Ears, nose, mouth, throat, and face: negative for hearing loss, nasal congestion, snoring and tinnitus Respiratory: negative for asthma, cough, sputum Cardiovascular: negative for chest pain, dyspnea, exertional chest pressure/discomfort, irregular heart beat, palpitations and syncope Gastrointestinal: negative for abdominal pain, change in bowel habits, nausea and vomiting Genitourinary: negative for abnormal menstrual periods, genital lesions, sexual problems, dysuria and urinary incontinence.   Integument/breast: negative for breast lump, and nipple discharge.  Hematologic/lymphatic: negative for bleeding and easy bruising Musculoskeletal:negative for back pain and muscle weakness Neurological: negative for dizziness, headaches, vertigo and weakness Endocrine: negative for diabetic symptoms including polydipsia, polyuria and skin dryness Allergic/Immunologic: negative for hay fever and urticaria Psychologic: positive for depression, anhedonia, sleep disturbances.  Negative for anxiety, mood swings, suicidal ideation     Objective:  Blood pressure 118/72, pulse 79, resp. rate 16, height 4\' 11"  (1.499 m), weight 158 lb 12.8 oz (72 kg), currently breastfeeding. Body mass index is 32.07 kg/m.   General Appearance:    Alert, cooperative, no distress, appears stated age, obesity (mild)  Head:    Normocephalic, without obvious abnormality, atraumatic  Eyes:    PERRL, conjunctiva/corneas clear, EOM's intact, both eyes   Ears:    Normal external ear canals, both ears  Nose:   Nares normal, septum midline, mucosa normal, no drainage or sinus tenderness  Throat:   Lips, mucosa, and tongue normal; teeth and gums normal  Neck:   Supple, symmetrical, trachea midline, no adenopathy; thyroid: no enlargement/tenderness/nodules; no carotid bruit or JVD  Back:     Symmetric, no curvature, ROM normal, no CVA tenderness  Lungs:     Clear to auscultation bilaterally, respirations unlabored  Chest Wall:    No tenderness or deformity   Heart:    Regular rate and rhythm, S1 and S2 normal, no murmur, rub or gallop  Breast Exam:    Breasts appear normal, no suspicious masses, no skin or nipple changes or axillary nodes.  Abdomen:     Soft, non-tender, bowel sounds active all four quadrants, no masses, no organomegaly.  Genitalia:    Pelvic:external genitalia normal, vagina without lesions, or tenderness. Scant thin white discharge noted.   Rectovaginal septum  normal. Cervix normal in appearance, no cervical motion tenderness, no adnexal masses or tenderness.  Uterus normal size, shape, mobile, regular contours, nontender.  Rectal:    Normal external sphincter.  No hemorrhoids appreciated. Internal exam not done.   Extremities:   Extremities normal, atraumatic, no cyanosis or edema  Pulses:   2+ and symmetric all extremities  Skin:   Skin color, texture, turgor normal, no rashes or lesions  Lymph nodes:   Cervical, supraclavicular, and axillary nodes normal  Neurologic:   CNII-XII intact, normal strength, sensation and reflexes throughout    Depression screen [facility] 2/9 01/09/2022 10/08/2021 07/03/2021 09/29/2020 12/15/2018  Decreased Interest 3 0 0 0 0  Down, Depressed, Hopeless 3 0 0 0 0  PHQ - 2 Score 6 0 0 0 0  Altered sleeping 2 0 - - -  Tired, decreased energy 2 0 - - -  Change in appetite 2 0 - - -  Feeling bad or failure about yourself  2 0 - - -  Trouble concentrating 1 0 - - -  Moving slowly or fidgety/restless 0 0  - - -  Suicidal thoughts 0 0 - - -  PHQ-9 Score 15 0 - - -  Difficult doing work/chores Somewhat difficult Not difficult at all - - -     Labs:  Lab Results  Component Value Date   WBC 17.1 (H) 05/16/2021   HGB 13.9 07/10/2021   HCT 42.4 07/10/2021   MCV 84.9 05/16/2021   PLT 172 05/16/2021    Lab Results  Component Value Date   CREATININE 0.71 12/01/2019   BUN 14 12/01/2019   NA 138 12/01/2019   K 3.8 12/01/2019   CL 104 12/01/2019   CO2 21 12/01/2019    Lab Results  Component Value Date   ALT 14 12/01/2019   AST 21 12/01/2019   ALKPHOS 54 12/01/2019   BILITOT <0.2 12/01/2019    No results found for: TSH   Assessment:   1. Encounter for well woman exam with routine gynecological exam   2. History of abnormal cervical Pap smear   3. Dyslipidemia   4. Obesity (BMI 30.0-34.9)   5. Screening for diabetes mellitus   6. Postpartum depression     Plan:    - Blood tests:CBC, CMP, Lipid panel, HgbA1c, TSH.  - Breast self exam technique reviewed and patient encouraged to perform self-exam monthly. - Contraception: Kyleena IUD - Discussed healthy lifestyle modifications. - Pap smear performed today for h/o abnormal pap smear (LGSIL).  - Patient with h/o mild dyslipidemia prior to pregnancy, was thought to be contributed to prior OCP use. Will reassess today.  Patient is young with no other risk factors.  - Depression - possibly postpartum related as it is within 1 year of her recent delivery, however there are other triggering factors such as job loss that could also contribute. Discussion had with patient today regarding symptoms.  Currently no SI/HI. Discussed options for management, including counseling and/or medications, or both.  Patient desires both for now.  Will prescribe Zoloft 50 mg and refer to counseling (given information to website to [email or web address]). To follow up in 4 weeks for mood check.  - COVID vaccination: declines -Follow-up in 1 year for  annual exam.    Samantha Maid, MD [facility]

## 2022-01-10 ENCOUNTER — Encounter: Payer: 59 | Admitting: Obstetrics and Gynecology

## 2022-01-10 LAB — COMPREHENSIVE METABOLIC PANEL WITH GFR
ALT: 12 IU/L (ref 0–32)
AST: 18 IU/L (ref 0–40)
Albumin/Globulin Ratio: 1.7 (ref 1.2–2.2)
Albumin: 4.5 g/dL (ref 3.9–5.0)
Alkaline Phosphatase: 137 IU/L — ABNORMAL HIGH (ref 44–121)
BUN/Creatinine Ratio: 21 (ref 9–23)
BUN: 16 mg/dL (ref 6–20)
Bilirubin Total: 0.2 mg/dL (ref 0.0–1.2)
CO2: 23 mmol/L (ref 20–29)
Calcium: 9.6 mg/dL (ref 8.7–10.2)
Chloride: 99 mmol/L (ref 96–106)
Creatinine, Ser: 0.76 mg/dL (ref 0.57–1.00)
Globulin, Total: 2.6 g/dL (ref 1.5–4.5)
Glucose: 77 mg/dL (ref 70–99)
Potassium: 3.7 mmol/L (ref 3.5–5.2)
Sodium: 138 mmol/L (ref 134–144)
Total Protein: 7.1 g/dL (ref 6.0–8.5)
eGFR: 113 mL/min/1.73

## 2022-01-10 LAB — CBC
Hematocrit: 41.2 % (ref 34.0–46.6)
Hemoglobin: 13.6 g/dL (ref 11.1–15.9)
MCH: 29.5 pg (ref 26.6–33.0)
MCHC: 33 g/dL (ref 31.5–35.7)
MCV: 89 fL (ref 79–97)
Platelets: 225 x10E3/uL (ref 150–450)
RBC: 4.61 x10E6/uL (ref 3.77–5.28)
RDW: 13.2 % (ref 11.7–15.4)
WBC: 8.4 x10E3/uL (ref 3.4–10.8)

## 2022-01-10 LAB — LIPID PANEL
Chol/HDL Ratio: 3.3 ratio (ref 0.0–4.4)
Cholesterol, Total: 216 mg/dL — ABNORMAL HIGH (ref 100–199)
HDL: 65 mg/dL (ref >39)
LDL Chol Calc (NIH): 120 mg/dL — ABNORMAL HIGH (ref 0–99)
Triglycerides: 180 mg/dL — ABNORMAL HIGH (ref 0–149)
VLDL Cholesterol Cal: 31 mg/dL (ref 5–40)

## 2022-01-10 LAB — HEMOGLOBIN A1C
Est. average glucose Bld gHb Est-mCnc: 114 mg/dL
Hgb A1c MFr Bld: 5.6 % (ref 4.8–5.6)

## 2022-01-10 LAB — TSH: TSH: 0.873 u[IU]/mL (ref 0.450–4.500)

## 2022-01-14 LAB — CYTOLOGY - PAP
Chlamydia: NEGATIVE
Comment: NEGATIVE
Comment: NEGATIVE
Comment: NORMAL
Diagnosis: NEGATIVE
Neisseria Gonorrhea: NEGATIVE
Trichomonas: NEGATIVE

## 2022-02-05 NOTE — Progress Notes (Signed)
? ? ?  GYNECOLOGY PROGRESS NOTE ? ?Subjective:  ? ? Patient ID: Samantha Ramsey, female    DOB: 1995/12/13, 26 y.o.   MRN: [id number] ? ?HPI ? Patient is a 26 y.o. G66P1011 female who presents for 4 week follow up of her postpartum depression. She was started on Sertraline HCI 50 mg at her previous visit. She reports that the medication is working for her, she does feel somewhat of a difference.  She reports that she has gotten a new job that begins next month, so this will also help with some of her depression as her financial and social situations will improve.  She does have some nausea that comes and goes. Is taking the medication at night.  ? ?The following portions of the patient's history were reviewed and updated as appropriate: allergies, current medications, past family history, past medical history, past social history, past surgical history, and problem list. ? ?- ?  ? ?Review of Systems ?Pertinent items noted in HPI and remainder of comprehensive ROS otherwise negative.  ? ?Objective:  ? Blood pressure 103/77, pulse 80, resp. rate 16, height '4\' 11"'$  (1.499 m), weight 160 lb 1.6 oz (72.6 kg), currently breastfeeding.  Blood pressure 103/77, pulse 80, resp. rate 16, height '4\' 11"'$  (1.499 m), weight 160 lb 1.6 oz (72.6 kg), currently breastfeeding. ? ?General appearance: alert and no distress ?Psych: appears to have normal mood and affect.  ? ? ? ?  02/06/2022  ?  8:45 AM 01/09/2022  ?  8:10 PM 10/08/2021  ? 12:59 PM 07/03/2021  ? 11:02 AM 09/29/2020  ? 10:03 AM  ?Depression screen PHQ 2/9  ?Decreased Interest 1 3 0 0 0  ?Down, Depressed, Hopeless 0 3 0 0 0  ?PHQ - 2 Score 1 6 0 0 0  ?Altered sleeping 1 2 0    ?Tired, decreased energy 1 2 0    ?Change in appetite 1 2 0    ?Feeling bad or failure about yourself  0 2 0    ?Trouble concentrating 1 1 0    ?Moving slowly or fidgety/restless 0 0 0    ?Suicidal thoughts 0 0 0    ?PHQ-9 Score 5 15 0    ?Difficult doing work/chores Somewhat difficult Somewhat difficult Not  difficult at all    ? ? ? ?Assessment:  ? ?1. Postpartum depression   ?  ? ?Plan:  ? ?Advised for patient to continue medication for a total of 3-4 months. If at that time she continues to feel stable, can consider weaning at that time. Discussed weaning process (half a tablet for 2 weeks and if still stable can discontinue).  Otherwise can continue for an additional 3-4 months at a time.  ? ? ?Rubie Maid, MD ?[facility]  ?

## 2022-02-06 ENCOUNTER — Encounter: Payer: Self-pay | Admitting: Obstetrics and Gynecology

## 2022-02-06 ENCOUNTER — Other Ambulatory Visit: Payer: Self-pay

## 2022-02-06 ENCOUNTER — Ambulatory Visit (INDEPENDENT_AMBULATORY_CARE_PROVIDER_SITE_OTHER): Payer: 59 | Admitting: Obstetrics and Gynecology

## 2022-02-06 VITALS — BP 103/77 | HR 80 | Resp 16 | Ht 59.0 in | Wt 160.1 lb

## 2022-02-06 DIAGNOSIS — F53 Postpartum depression: Secondary | ICD-10-CM

## 2022-02-06 NOTE — Patient Instructions (Signed)

## 2022-02-08 ENCOUNTER — Encounter: Payer: Self-pay | Admitting: Obstetrics and Gynecology

## 2022-02-08 DIAGNOSIS — B3731 Acute candidiasis of vulva and vagina: Secondary | ICD-10-CM

## 2022-02-08 MED ORDER — FLUCONAZOLE 150 MG PO TABS: 150.0000 mg | ORAL_TABLET | Freq: Once | ORAL | 0 refills | Status: AC

## 2022-02-09 MED ORDER — FLUCONAZOLE 150 MG PO TABS: 150.0000 mg | ORAL_TABLET | Freq: Once | ORAL | 3 refills | Status: AC

## 2022-03-19 ENCOUNTER — Encounter: Payer: Self-pay | Admitting: Internal Medicine

## 2022-03-19 ENCOUNTER — Ambulatory Visit (INDEPENDENT_AMBULATORY_CARE_PROVIDER_SITE_OTHER): Payer: 59 | Admitting: Internal Medicine

## 2022-03-19 ENCOUNTER — Other Ambulatory Visit: Payer: Self-pay | Admitting: Emergency Medicine

## 2022-03-19 VITALS — BP 110/70 | HR 105 | Temp 98.1°F | Resp 16 | Ht 59.0 in | Wt 164.3 lb

## 2022-03-19 DIAGNOSIS — R051 Acute cough: Secondary | ICD-10-CM

## 2022-03-19 DIAGNOSIS — J029 Acute pharyngitis, unspecified: Secondary | ICD-10-CM | POA: Diagnosis not present

## 2022-03-19 DIAGNOSIS — R519 Headache, unspecified: Secondary | ICD-10-CM | POA: Diagnosis not present

## 2022-03-19 DIAGNOSIS — R0981 Nasal congestion: Secondary | ICD-10-CM | POA: Diagnosis not present

## 2022-03-19 LAB — POCT INFLUENZA A/B
Influenza A, POC: NEGATIVE
Influenza B, POC: NEGATIVE
Influenza B, POC: NEGATIVE

## 2022-03-19 LAB — POCT RAPID STREP A (OFFICE): Rapid Strep A Screen: NEGATIVE

## 2022-03-19 MED ORDER — FLUTICASONE PROPIONATE 50 MCG/ACT NA SUSP: 2.0000 | Freq: Every day | NASAL | 6 refills | Status: DC

## 2022-03-19 MED ORDER — METHYLPREDNISOLONE 4 MG PO TBPK: ORAL_TABLET | ORAL | 0 refills | Status: DC

## 2022-03-19 MED ORDER — BENZONATATE 100 MG PO CAPS
100.0000 mg | ORAL_CAPSULE | Freq: Two times a day (BID) | ORAL | 0 refills | Status: DC | PRN
Start: 2022-03-19 — End: 2022-06-27

## 2022-03-19 NOTE — Progress Notes (Signed)
? ?Acute Office Visit ? ?Subjective:  ? ?  ?Patient ID: Samantha Ramsey, female    DOB: Jun 02, 1996, 26 y.o.   MRN: [id number] ? ?Chief Complaint  ?Patient presents with  ? Sore Throat  ?  Onset 03/18/2022  ? Headache  ?  Onset 03/18/2022  ? Nasal Congestion  ?  Onset 03/18/2022. Burning sensation in nose.  ? ? ?HPI ?Patient is in today for sore throat, headache, nasal congestion. Symptoms began yesterday 03/18/22.  ? ?URI Compliant:  ? ?-Fever: no ?-Cough: yes ?-Shortness of breath: yes ?-Wheezing: no ?-Chest pain: no ?-Chest tightness: no ?-Chest congestion: yes ?-Nasal congestion: yes ?-Runny nose: yes ?-Post nasal drip: yes ?-Sore throat: yes ?-Swollen glands: no ?-Sinus pressure: yes ?-Headache: yes ?-Face pain: no ?-Ear pain: yes left ?-Ear pressure: yes bilateral  ?-Vomiting: no ?-Sick contacts: no  ?-Context: worse ?-Relief with OTC cold/cough medications: no  ?-Treatments attempted: cold/sinus  ? ? ?Review of Systems  ?Constitutional:  Positive for malaise/fatigue. Negative for chills and fever.  ?HENT:  Positive for congestion, ear pain and sore throat. Negative for ear discharge and sinus pain.   ?Respiratory:  Positive for cough and shortness of breath. Negative for wheezing.   ?Cardiovascular:  Negative for chest pain.  ?Gastrointestinal:  Negative for abdominal pain, diarrhea, nausea and vomiting.  ?Neurological:  Positive for headaches. Negative for dizziness.  ? ? ?   ?Objective:  ?  ?BP 110/70   Pulse (!) 105   Temp 98.1 ?F (36.7 ?C)   Resp 16   Ht '4\' 11"'$  (1.499 m)   Wt 164 lb 4.8 oz (74.5 kg)   SpO2 99%   BMI 33.18 kg/m?  ?BP Readings from Last 3 Encounters:  ?03/19/22 110/70  ?02/06/22 103/77  ?01/09/22 118/72  ? ?Wt Readings from Last 3 Encounters:  ?03/19/22 164 lb 4.8 oz (74.5 kg)  ?02/06/22 160 lb 1.6 oz (72.6 kg)  ?01/09/22 158 lb 12.8 oz (72 kg)  ? ?  ? ?Physical Exam ?Constitutional:   ?   Appearance: She is well-developed.  ?HENT:  ?   Head: Normocephalic and atraumatic.  ?   Right Ear:  Tympanic membrane and ear canal normal.  ?   Left Ear: Ear canal normal. A middle ear effusion is present.  ?   Nose: Congestion present.  ?   Mouth/Throat:  ?   Mouth: Mucous membranes are moist. Mucous membranes are pale.  ?   Comments: PND present ?Eyes:  ?   Conjunctiva/sclera: Conjunctivae normal.  ?Neck:  ?   Thyroid: No thyromegaly.  ?Cardiovascular:  ?   Rate and Rhythm: Normal rate and regular rhythm.  ?Pulmonary:  ?   Effort: Pulmonary effort is normal.  ?   Breath sounds: Normal breath sounds. No wheezing, rhonchi or rales.  ?Musculoskeletal:  ?   Cervical back: Neck supple.  ?Lymphadenopathy:  ?   Cervical: No cervical adenopathy.  ?Skin: ?   General: Skin is warm and dry.  ?Neurological:  ?   General: No focal deficit present.  ?   Mental Status: She is alert.  ?Psychiatric:     ?   Mood and Affect: Mood normal.     ?   Behavior: Behavior normal.  ? ? ?No results found for any visits on 03/19/22. ? ? ?   ?Assessment & Plan:  ? ?1. Nasal congestion/Sore throat/Acute nonintractable headache, unspecified headache type/Acute cough: Symptoms started yesterday, flu and strep negative today. COVID test pending. Will give a work  note until Friday until test results, will quarantine. Most likely viral, will treat with steroid pack, nasal steroid, cough suppressant. Follow up if symptoms worsen or fail to improve.  ? ?- Novel Coronavirus, NAA ([facility]) ?- POCT Influenza A/B ?- POCT Influenza A/B ?- methylPREDNISolone (MEDROL DOSEPAK) 4 MG TBPK tablet; Day 1: Take 8 mg (2 tablets) before breakfast, 4 mg (1 tablet) after lunch, 4 mg (1 tablet) after supper, and 8 mg (2 tablets) at bedtime. Day 2:Take 4 mg (1 tablet) before breakfast, 4 mg (1 tablet) after lunch, 4 mg (1 tablet) after supper, and 8 mg (2 tablets) at bedtime. Day 3: Take 4 mg (1 tablet) before breakfast, 4 mg (1 tablet) after lunch, 4 mg (1 tablet) after supper, and 4 mg (1 tablet) at bedtime. Day 4: Take 4 mg (1 tablet) before breakfast, 4 mg (1  tablet) after lunch, and 4 mg (1 tablet) at bedtime. Day 5: Take 4 mg (1 tablet) before breakfast and 4 mg (1 tablet) at bedtime. Day 6: Take 4 mg (1 tablet) before breakfast.  Dispense: 1 each; Refill: 0 ?- fluticasone (FLONASE) 50 MCG/ACT nasal spray; Place 2 sprays into both nostrils daily.  Dispense: 16 g; Refill: 6 ?- benzonatate (TESSALON) 100 MG capsule; Take 1 capsule (100 mg total) by mouth 2 (two) times daily as needed for cough.  Dispense: 20 capsule; Refill: 0 ? ?Return if symptoms worsen or fail to improve. ? ?Teodora Medici, DO ? ? ?

## 2022-03-19 NOTE — Patient Instructions (Signed)
It was great seeing you today! ? ?Plan discussed at today's visit: ?-Steroid pack, Flonase and cough suppressant sent to pharmacy ?-We will let you know as soon as COVID test results ?-Try to rest, stay well hydrated  ? ?Follow up in: as needed ? ?Take care and let us know if you have any questions or concerns prior to your next visit. ? ?Dr. Rosana Berger ? ?

## 2022-03-21 LAB — NOVEL CORONAVIRUS, NAA: SARS-CoV-2, NAA: NOT DETECTED

## 2022-05-22 ENCOUNTER — Emergency Department: Payer: 59

## 2022-05-22 ENCOUNTER — Other Ambulatory Visit: Payer: Self-pay

## 2022-05-22 ENCOUNTER — Emergency Department
Admission: EM | Admit: 2022-05-22 | Discharge: 2022-05-22 | Disposition: A | Discharge status: Home / Self Care | Payer: 59 | Attending: Emergency Medicine | Admitting: Emergency Medicine

## 2022-05-22 DIAGNOSIS — S40022A Contusion of left upper arm, initial encounter: Secondary | ICD-10-CM | POA: Diagnosis not present

## 2022-05-22 DIAGNOSIS — W500XXA Accidental hit or strike by another person, initial encounter: Secondary | ICD-10-CM | POA: Insufficient documentation

## 2022-05-22 DIAGNOSIS — S0990XA Unspecified injury of head, initial encounter: Secondary | ICD-10-CM | POA: Diagnosis not present

## 2022-05-22 DIAGNOSIS — S4991XA Unspecified injury of right shoulder and upper arm, initial encounter: Secondary | ICD-10-CM | POA: Diagnosis not present

## 2022-05-22 DIAGNOSIS — S40021A Contusion of right upper arm, initial encounter: Secondary | ICD-10-CM | POA: Insufficient documentation

## 2022-05-22 DIAGNOSIS — Z8616 Personal history of COVID-19: Secondary | ICD-10-CM | POA: Insufficient documentation

## 2022-05-22 DIAGNOSIS — T07XXXA Unspecified multiple injuries, initial encounter: Secondary | ICD-10-CM

## 2022-05-22 MED ORDER — NAPROXEN 500 MG PO TABS: 500.0000 mg | ORAL_TABLET | Freq: Two times a day (BID) | ORAL | 0 refills | Status: DC

## 2022-05-22 MED ORDER — NAPROXEN 500 MG PO TABS
500.0000 mg | ORAL_TABLET | Freq: Once | ORAL | Status: AC
  Administered 2022-05-22: 500 mg via ORAL
  Filled 2022-05-22: qty 1
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
## 2022-05-22 NOTE — ED Notes (Signed)
Pt in bed, mom at bedside, ice packs given, pt states that she is ready to go home, pt verbalized understanding d/c and follow up, pt from dpt.
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
## 2022-06-26 NOTE — Progress Notes (Unsigned)
    GYNECOLOGY PROGRESS NOTE  Subjective:    Patient ID: Samantha Ramsey, female    DOB: June 27, 1996, 26 y.o.   MRN: [id number]  HPI  Patient is a 26 y.o. [id number] female who presents for evaluation of irregular bleeding.Bleeding started the day of her LMP 06/15/2022 and has not stopped since has had cramping, reports pain is a "6". Patient does have Kyleena IUD in place for contraception. Bleeding was initially heavy but now is much lighter. Is not taking anything for her pain.   The following portions of the patient's history were reviewed and updated as appropriate: allergies, current medications, past family history, past medical history, past social history, past surgical history, and problem list.  Review of Systems Pertinent items are noted in HPI.   Objective:    Blood pressure 112/75, pulse 82, height '4\' 11"'$  (1.499 m), weight 154 lb 1.6 oz (69.9 kg), last menstrual period 06/15/2022, not currently breastfeeding.  Body mass index is 31.12 kg/m. General appearance: alert, cooperative, and no distress Abdomen: soft, non-tender; bowel sounds normal; no masses,  no organomegaly Pelvic: external genitalia normal, rectovaginal septum normal.  Vagina with scant pink tinged  discharge.  Cervix normal appearing, no lesions and no motion tenderness.  Bimanual exam not performed.  Extremities: extremities normal, atraumatic, no cyanosis or edema Neurologic: Grossly normal   Assessment:   1. Irregular menstrual bleeding   2. IUD (intrauterine device) in place   3. Pelvic cramping      Plan:   IUD appears to be in place, bleeding is minimal. Appears that abnormal bleeding is resolving. Continue to monitor symptoms with next cycle.   Advised on NSAIDs/Tylenol for cramping.    Rubie Maid, MD [facility]

## 2022-06-27 ENCOUNTER — Encounter: Payer: Self-pay | Admitting: Obstetrics and Gynecology

## 2022-06-27 ENCOUNTER — Ambulatory Visit (INDEPENDENT_AMBULATORY_CARE_PROVIDER_SITE_OTHER): Payer: 59 | Admitting: Obstetrics and Gynecology

## 2022-06-27 VITALS — BP 112/75 | HR 82 | Ht 59.0 in | Wt 154.1 lb

## 2022-06-27 DIAGNOSIS — R102 Pelvic and perineal pain: Secondary | ICD-10-CM

## 2022-06-27 DIAGNOSIS — Z975 Presence of (intrauterine) contraceptive device: Secondary | ICD-10-CM

## 2022-06-27 DIAGNOSIS — N926 Irregular menstruation, unspecified: Secondary | ICD-10-CM

## 2022-07-12 ENCOUNTER — Ambulatory Visit: Payer: Self-pay | Admitting: *Deleted

## 2022-07-12 ENCOUNTER — Ambulatory Visit (INDEPENDENT_AMBULATORY_CARE_PROVIDER_SITE_OTHER): Payer: 59 | Admitting: Nurse Practitioner

## 2022-07-12 ENCOUNTER — Encounter: Payer: Self-pay | Admitting: Nurse Practitioner

## 2022-07-12 VITALS — BP 110/68 | HR 94 | Temp 98.5°F | Resp 16 | Ht 59.0 in | Wt 153.0 lb

## 2022-07-12 DIAGNOSIS — N939 Abnormal uterine and vaginal bleeding, unspecified: Secondary | ICD-10-CM

## 2022-07-12 MED ORDER — NAPROXEN 500 MG PO TABS: 500.0000 mg | ORAL_TABLET | Freq: Two times a day (BID) | ORAL | 0 refills | Status: AC

## 2022-07-12 NOTE — Progress Notes (Signed)
BP 110/68   Pulse 94   Temp 98.5 F (36.9 C)   Resp 16   Ht '4\' 11"'$  (1.499 m)   Wt 153 lb (69.4 kg)   LMP 06/15/2022 (Exact Date)   SpO2 99%   BMI 30.90 kg/m    Subjective:    Patient ID: Samantha Ramsey, female    DOB: [birth year], 26 y.o.   MRN: [id number]  HPI: Samantha Ramsey is a 26 y.o. female  Chief Complaint  Patient presents with   abnormal period    bleeding  Vaginal bleeding: Patient was seen by OB/GYN Dr. Marcelline Mates on 06/27/2022 regarding her irregular vaginal bleeding.  Patient had reported that her bleeding started at her last menstrual cycle on 06/15/2022 had not stopped yet.  Patient had reported cramping and her pain was a 6 at that visit.  Patient does have a Kyleena IUD in place for contraception.  That appointment patient reported bleeding was initially heavy but then was much lighter.  And that she was not taking anything for pain.  Patient is reporting today that she has continued to have vaginal bleeding.  It is now heavier.  She says she is to change her pad every 2 hours.  We will get labs.  Discussed with patient that this is likely due to her IUD and she needs to follow back up with Dr. Marcelline Mates.  Contacted Dr. Marcelline Mates regarding patient and she is aware.  She did recommend starting the patient on an NSAID 2 times a day for seven days.    Relevant past medical, surgical, family and social history reviewed and updated as indicated. Interim medical history since our last visit reviewed. Allergies and medications reviewed and updated.  Review of Systems  Constitutional: Negative for fever or weight change.  Respiratory: Negative for cough and shortness of breath.   Cardiovascular: Negative for chest pain or palpitations.  Gastrointestinal: Negative for abdominal pain, no bowel changes.  Musculoskeletal: Negative for gait problem or joint swelling.  Skin: Negative for rash.  Neurological: Negative for dizziness or headache.  No other specific complaints in a complete  review of systems (except as listed in HPI above).      Objective:    BP 110/68   Pulse 94   Temp 98.5 F (36.9 C)   Resp 16   Ht '4\' 11"'$  (1.499 m)   Wt 153 lb (69.4 kg)   LMP 06/15/2022 (Exact Date)   SpO2 99%   BMI 30.90 kg/m   Wt Readings from Last 3 Encounters:  07/12/22 153 lb (69.4 kg)  06/27/22 154 lb 1.6 oz (69.9 kg)  05/22/22 158 lb (71.7 kg)    Physical Exam  Constitutional: Patient appears well-developed and well-nourished.  No distress.  HEENT: head atraumatic, normocephalic, pupils equal and reactive to light, neck supple Cardiovascular: Normal rate, regular rhythm and normal heart sounds.  No murmur heard. No BLE edema. Pulmonary/Chest: Effort normal and breath sounds normal. No respiratory distress. Abdominal: Soft.  There is no tenderness. Psychiatric: Patient has a normal mood and affect. behavior is normal. Judgment and thought content normal.  Results for orders placed or performed in visit on 03/19/22  Novel Coronavirus, NAA ([facility])   Specimen: Nasopharyngeal(NP) swabs in vial transport medium   Nasopharynge  Previous  Result Value Ref Range   SARS-CoV-2, NAA Not Detected Not Detected  POCT rapid strep A  Result Value Ref Range   Rapid Strep A Screen Negative Negative  POCT Influenza A/B  Result  Value Ref Range   Influenza A, POC     Influenza B, POC Negative Negative  POCT Influenza A/B  Result Value Ref Range   Influenza A, POC Negative Negative   Influenza B, POC Negative Negative      Assessment & Plan:   Problem List Items Addressed This Visit   None Visit Diagnoses     Vaginal bleeding    -  Primary   Start patient on naproxen 500 mg 2 times a day.  We will get labs.  Bleeding likely due to IUD, recommend following up with Dr. Marcelline Mates OB/GYN.   Relevant Medications   naproxen (NAPROSYN) 500 MG tablet   Other Relevant Orders   CBC with Differential/Platelet   TSH   Iron, TIBC and Ferritin Panel        Follow up plan: Follow  up with Dr. Marcelline Mates OB/GYN

## 2022-07-12 NOTE — Telephone Encounter (Signed)
  Chief Complaint: vaginal bleeding  Symptoms: vaginal bleeding x 27 days now bleeding heavy bright red . No clots. Changes pads every 2 hours but not soaked. IUD in place. Has not tested for pregnancy  Frequency: 27 days ago  Pertinent Negatives: Patient denies dizziness, no lightheadedness no difficulty breathing  Disposition: '[]'$ ED /'[]'$ Urgent Care (no appt availability in office) / '[x]'$ Appointment(In office/virtual)/ '[]'$  [place] [facility]/ '[]'$ Home Care/ '[]'$ Refused Recommended Disposition /'[]'$ [place] [facility]/ '[]'$  Follow-up with PCP Additional Notes:   Appt today     Reason for Disposition  [1] Periods with > 6 soaked pads or tampons per day AND [2] last > 7 days  Answer Assessment - Initial Assessment Questions 1. AMOUNT: "Describe the bleeding that you are having."    - SPOTTING: spotting, or pinkish / brownish mucous discharge; does not fill panty liner or pad    - MILD:  less than 1 pad / hour; less than patient's usual menstrual bleeding   - MODERATE: 1-2 pads / hour; 1 menstrual cup every 6 hours; small-medium blood clots (e.g., pea, grape, small coin)   - SEVERE: soaking 2 or more pads/hour for 2 or more hours; 1 menstrual cup every 2 hours; bleeding not contained by pads or continuous red blood from vagina; large blood clots (e.g., golf ball, large coin)      Moderate  started of dark and pinkish bleeding . Now heavy and bright red blood  2. ONSET: "When did the bleeding begin?" "Is it continuing now?"     27 days ago  3. MENSTRUAL PERIOD: "When was the last normal menstrual period?" "How is this different than your period?"     Greater than 27 days ago  4. REGULARITY: "How regular are your periods?"     Regular prior to this  5. ABDOMEN PAIN: "Do you have any pain?" "How bad is the pain?"  (e.g., Scale 1-10; mild, moderate, or severe)   - MILD (1-3): doesn't interfere with normal activities, abdomen soft and not tender to touch    - MODERATE (4-7): interferes with normal  activities or awakens from sleep, abdomen tender to touch    - SEVERE (8-10): excruciating pain, doubled over, unable to do any normal activities      Low abdominal cramping 6. PREGNANCY: "Is there any chance you are pregnant?" "When was your last menstrual period?"     Does not think so due to IUD in place  7. BREASTFEEDING: "Are you breastfeeding?"     na 8. HORMONE MEDICINES: "Are you taking any hormone medicines, prescription or over-the-counter?" (e.g., birth control pills, estrogen)     IUD 9. BLOOD THINNER MEDICINES: "Do you take any blood thinners?" (e.g., Coumadin / warfarin, Pradaxa / dabigatran, aspirin)     Na  10. CAUSE: "What do you think is causing the bleeding?" (e.g., recent gyn surgery, recent gyn procedure; known bleeding disorder, cervical cancer, polycystic ovarian disease, fibroids)         na 11. HEMODYNAMIC STATUS: "Are you weak or feeling lightheaded?" If Yes, ask: "Can you stand and walk normally?"        Feels tired  12. OTHER SYMPTOMS: "What other symptoms are you having with the bleeding?" (e.g., passed tissue, vaginal discharge, fever, menstrual-type cramps)       Abdominal cramps  Protocols used: Vaginal Bleeding - Abnormal-A-AH

## 2022-07-13 LAB — IRON,TIBC AND FERRITIN PANEL
%SAT: 23 % (calc) (ref 16–45)
Ferritin: 22 ng/mL (ref 16–154)
Iron: 90 ug/dL (ref 40–190)
TIBC: 391 mcg/dL (calc) (ref 250–450)

## 2022-07-13 LAB — CBC WITH DIFFERENTIAL/PLATELET
Absolute Monocytes: 461 cells/uL (ref 200–950)
Basophils Absolute: 29 cells/uL (ref 0–200)
Basophils Relative: 0.3 %
Eosinophils Absolute: 67 cells/uL (ref 15–500)
Eosinophils Relative: 0.7 %
HCT: 41.5 % (ref 35.0–45.0)
Hemoglobin: 14.1 g/dL (ref 11.7–15.5)
Lymphs Abs: 2765 cells/uL (ref 850–3900)
MCH: 30.1 pg (ref 27.0–33.0)
MCHC: 34 g/dL (ref 32.0–36.0)
MCV: 88.5 fL (ref 80.0–100.0)
MPV: 9.8 fL (ref 7.5–12.5)
Monocytes Relative: 4.8 %
Neutro Abs: 6278 cells/uL (ref 1500–7800)
Neutrophils Relative %: 65.4 %
Platelets: 245 10*3/uL (ref 140–400)
RBC: 4.69 10*6/uL (ref 3.80–5.10)
RDW: 12.9 % (ref 11.0–15.0)
Total Lymphocyte: 28.8 %
WBC: 9.6 10*3/uL (ref 3.8–10.8)

## 2022-07-13 LAB — TSH: TSH: 0.74 mIU/L

## 2022-07-17 ENCOUNTER — Encounter: Payer: Self-pay | Admitting: Obstetrics and Gynecology

## 2022-07-24 DIAGNOSIS — H33323 Round hole, bilateral: Secondary | ICD-10-CM | POA: Diagnosis not present

## 2022-07-24 DIAGNOSIS — H31093 Other chorioretinal scars, bilateral: Secondary | ICD-10-CM | POA: Diagnosis not present

## 2022-07-24 DIAGNOSIS — H35413 Lattice degeneration of retina, bilateral: Secondary | ICD-10-CM | POA: Diagnosis not present

## 2022-07-24 DIAGNOSIS — H5213 Myopia, bilateral: Secondary | ICD-10-CM | POA: Diagnosis not present

## 2022-08-23 ENCOUNTER — Encounter: Payer: Self-pay | Admitting: Emergency Medicine

## 2022-08-23 ENCOUNTER — Ambulatory Visit
Admission: EM | Admit: 2022-08-23 | Discharge: 2022-08-23 | Disposition: A | Discharge status: Home / Self Care | Payer: 59 | Attending: Internal Medicine | Admitting: Internal Medicine

## 2022-08-23 DIAGNOSIS — Z1152 Encounter for screening for COVID-19: Secondary | ICD-10-CM | POA: Insufficient documentation

## 2022-08-23 DIAGNOSIS — J069 Acute upper respiratory infection, unspecified: Secondary | ICD-10-CM | POA: Insufficient documentation

## 2022-08-23 DIAGNOSIS — R059 Cough, unspecified: Secondary | ICD-10-CM | POA: Insufficient documentation

## 2022-08-23 DIAGNOSIS — Z79899 Other long term (current) drug therapy: Secondary | ICD-10-CM | POA: Diagnosis not present

## 2022-08-23 MED ORDER — BENZONATATE 100 MG PO CAPS: 100.0000 mg | ORAL_CAPSULE | Freq: Three times a day (TID) | ORAL | 0 refills | Status: DC | PRN

## 2022-08-23 MED ORDER — PROMETHAZINE-DM 6.25-15 MG/5ML PO SYRP: 5.0000 mL | ORAL_SOLUTION | Freq: Every evening | ORAL | 0 refills | Status: DC | PRN

## 2022-08-23 MED ORDER — FLUTICASONE PROPIONATE 50 MCG/ACT NA SUSP: 1.0000 | Freq: Every day | NASAL | 0 refills | Status: DC

## 2022-08-23 NOTE — Discharge Instructions (Signed)
You have a viral upper respiratory infection that should run its course and self resolve with symptomatic treatment.  I prescribed you 3 medications to alleviate symptoms.  Promethazine DM cough medication is to be taken at night before going to bed as it can cause drowsiness.  Do not drive with taking this medication.  COVID test is pending.  We will call if it is positive.  Please follow-up if symptoms persist or worsen.

## 2022-08-23 NOTE — ED Triage Notes (Signed)
Pt is present today with c/o sore throat, cough, nasal congestion, and fatigue. Pt sx started Monday

## 2022-08-23 NOTE — ED Provider Notes (Signed)
[facility]    CSN: [id number] Arrival date & time: 08/23/22  1441      History   Chief Complaint Chief Complaint  Patient presents with   Cough   Nasal Congestion   Fatigue   Sore Throat    HPI Samantha Ramsey is a 26 y.o. female.   Patient presents with cough, nasal congestion, fatigue, sore throat that started about 5 days ago.  Denies any known sick contacts or fevers at home.  Denies chest pain, shortness of breath, ear pain, nausea, vomiting, diarrhea, abdominal pain.  Patient has taken over-the-counter cold and flu medications with minimal improvement in symptoms.   Cough Sore Throat    Past Medical History:  Diagnosis Date   Chlamydia 07/2018   treated   Gonorrhea    12/15/2018, 09/2018.  Treated   History of COVID-19    HSV-1 (herpes simplex virus 1) infection    Serology postiive only. Has never had outbreak   Wears contact lenses    Yeast vaginitis     Patient Active Problem List   Diagnosis Date Noted   Iliotibial band syndrome of left side 07/03/2021   History of miscarriage 06/27/2020   LGSIL on Pap smear of cervix 12/08/2019   Human herpes simplex virus type 1 (HSV-1) DNA detected 12/03/2019   Yeast infection of the vagina 03/17/2018    Past Surgical History:  Procedure Laterality Date   DILATION AND EVACUATION N/A 04/13/2020   Procedure: DILATATION AND EVACUATION;  Surgeon: Rubie Maid, MD;  Location: [facility];  Service: Gynecology;  Laterality: N/A;   MINOR EXCISION EAR CANAL MASS Left 11/01/2021   Procedure: EXCISION LEFT EAR CANAL MASS;  Surgeon: Margaretha Sheffield, MD;  Location: [facility];  Service: ENT;  Laterality: Left;   WISDOM TOOTH EXTRACTION  10/2016   all four;     OB History     Gravida  2   Para  1   Term  1   Preterm  0   AB  1   Living  1      SAB  1   IAB  0   Ectopic  0   Multiple  0   Live Births  1            Home Medications    Prior to Admission medications    Medication Sig Start Date End Date Taking? Authorizing Provider  benzonatate (TESSALON) 100 MG capsule Take 1 capsule (100 mg total) by mouth every 8 (eight) hours as needed for cough. 08/23/22  Yes , Hildred Alamin E, FNP  fluticasone (FLONASE) 50 MCG/ACT nasal spray Place 1 spray into both nostrils daily for 3 days. 08/23/22 08/26/22 Yes , Michele Rockers, FNP  promethazine-dextromethorphan (PROMETHAZINE-DM) 6.25-15 MG/5ML syrup Take 5 mLs by mouth at bedtime as needed for cough. 08/23/22  Yes , Michele Rockers, FNP  levonorgestrel (KYLEENA) 19.5 MG IUD 1 each by Intrauterine route once. Inserted 07/10/2021    [provider]    Family History Family History  Problem Relation Age of Onset   Hypertension Mother    Healthy Father    Cervical cancer Maternal Grandmother    Ovarian cancer Maternal Grandmother    Breast cancer Neg Hx    Colon cancer Neg Hx     Social History Social History   Tobacco Use   Smoking status: Never   Smokeless tobacco: Never  Vaping Use   Vaping Use: Never used  Substance Use Topics   Alcohol  use: Not Currently    Alcohol/week: 1.0 standard drink of alcohol    Types: 1 Standard drinks or equivalent per week   Drug use: No     Allergies   Flagyl [metronidazole]   Review of Systems Review of Systems Per HPI  Physical Exam Triage Vital Signs ED Triage Vitals  Enc Vitals Group     BP 08/23/22 1458 119/82     Pulse Rate 08/23/22 1458 87     Resp 08/23/22 1458 16     Temp 08/23/22 1451 98.7 F (37.1 C)     Temp src --      SpO2 08/23/22 1458 98 %     Weight --      Height --      Head Circumference --      Peak Flow --      Pain Score 08/23/22 1453 4     Pain Loc --      Pain Edu? --      Excl. in [place]? --    No data found.  Updated Vital Signs BP 119/82   Pulse 87   Temp 98.7 F (37.1 C)   Resp 16   SpO2 98%   Breastfeeding No   Visual Acuity Right Eye Distance:   Left Eye Distance:   Bilateral Distance:    Right Eye Near:    Left Eye Near:    Bilateral Near:     Physical Exam Constitutional:      General: She is not in acute distress.    Appearance: Normal appearance. She is not toxic-appearing or diaphoretic.  HENT:     Head: Normocephalic and atraumatic.     Right Ear: Tympanic membrane and ear canal normal.     Left Ear: Tympanic membrane and ear canal normal.     Nose: Congestion present.     Mouth/Throat:     Mouth: Mucous membranes are moist.     Pharynx: No posterior oropharyngeal erythema.  Eyes:     Extraocular Movements: Extraocular movements intact.     Conjunctiva/sclera: Conjunctivae normal.     Pupils: Pupils are equal, round, and reactive to light.  Cardiovascular:     Rate and Rhythm: Normal rate and regular rhythm.     Pulses: Normal pulses.     Heart sounds: Normal heart sounds.  Pulmonary:     Effort: Pulmonary effort is normal. No respiratory distress.     Breath sounds: Normal breath sounds. No stridor. No wheezing, rhonchi or rales.  Abdominal:     General: Abdomen is flat. Bowel sounds are normal.     Palpations: Abdomen is soft.  Musculoskeletal:        General: Normal range of motion.     Cervical back: Normal range of motion.  Skin:    General: Skin is warm and dry.  Neurological:     General: No focal deficit present.     Mental Status: She is alert and oriented to person, place, and time. Mental status is at baseline.  Psychiatric:        Mood and Affect: Mood normal.        Behavior: Behavior normal.      UC Treatments / Results  Labs (all labs ordered are listed, but only abnormal results are displayed) Labs Reviewed  SARS CORONAVIRUS 2 (TAT 6-24 HRS)    EKG   Radiology No results found.  Procedures Procedures (including critical care time)  Medications Ordered in UC Medications - No data  to display  Initial Impression / Assessment and Plan / UC Course  I have reviewed the triage vital signs and the nursing notes.  Pertinent labs & imaging  results that were available during my care of the patient were reviewed by me and considered in my medical decision making (see chart for details).     Patient presents with symptoms likely from a viral upper respiratory infection. Differential includes bacterial pneumonia, sinusitis, allergic rhinitis, COVID-19, flu, RSV. Do not suspect underlying cardiopulmonary process. Symptoms seem unlikely related to ACS, CHF or COPD exacerbations, pneumonia, pneumothorax. Patient is nontoxic appearing and not in need of emergent medical intervention.  COVID test pending.  Recommended symptom control with medications.  Patient sent prescriptions and advised that Promethazine DM is to be taken at night before going to bed as it can cause drowsiness.  Return if symptoms fail to improve in 1-2 weeks or you develop shortness of breath, chest pain, severe headache. Patient states understanding and is agreeable.  Discharged with PCP followup.  Final Clinical Impressions(s) / UC Diagnoses   Final diagnoses:  Viral upper respiratory tract infection with cough     Discharge Instructions      You have a viral upper respiratory infection that should run its course and self resolve with symptomatic treatment.  I prescribed you 3 medications to alleviate symptoms.  Promethazine DM cough medication is to be taken at night before going to bed as it can cause drowsiness.  Do not drive with taking this medication.  COVID test is pending.  We will call if it is positive.  Please follow-up if symptoms persist or worsen.    ED Prescriptions     Medication Sig Dispense Auth. Provider   fluticasone (FLONASE) 50 MCG/ACT nasal spray Place 1 spray into both nostrils daily for 3 days. 16 g , Hildred Alamin E, [place]   benzonatate (TESSALON) 100 MG capsule Take 1 capsule (100 mg total) by mouth every 8 (eight) hours as needed for cough. 21 capsule [place], [place] E, [place]   promethazine-dextromethorphan (PROMETHAZINE-DM) 6.25-15 MG/5ML  syrup Take 5 mLs by mouth at bedtime as needed for cough. 118 mL Teodora Medici, [place]      PDMP not reviewed this encounter.   Teodora Medici, [place] 08/23/22 [phone]

## 2022-08-26 LAB — SARS CORONAVIRUS 2 (TAT 6-24 HRS): SARS Coronavirus 2: NEGATIVE

## 2022-08-29 ENCOUNTER — Encounter: Payer: Self-pay | Admitting: Obstetrics and Gynecology

## 2022-12-28 ENCOUNTER — Ambulatory Visit
Admission: EM | Admit: 2022-12-28 | Discharge: 2022-12-28 | Disposition: A | Discharge status: Home / Self Care | Payer: Federal, State, Local not specified - PPO | Attending: Family Medicine | Admitting: Family Medicine

## 2022-12-28 DIAGNOSIS — J069 Acute upper respiratory infection, unspecified: Secondary | ICD-10-CM | POA: Diagnosis not present

## 2022-12-28 DIAGNOSIS — R059 Cough, unspecified: Secondary | ICD-10-CM | POA: Insufficient documentation

## 2022-12-28 DIAGNOSIS — J209 Acute bronchitis, unspecified: Secondary | ICD-10-CM

## 2022-12-28 DIAGNOSIS — Z8616 Personal history of COVID-19: Secondary | ICD-10-CM | POA: Diagnosis not present

## 2022-12-28 DIAGNOSIS — Z1152 Encounter for screening for COVID-19: Secondary | ICD-10-CM | POA: Insufficient documentation

## 2022-12-28 LAB — RAPID INFLUENZA A&B ANTIGENS
Influenza A (ARMC): NEGATIVE
Influenza B (ARMC): NEGATIVE

## 2022-12-28 LAB — GROUP A STREP BY PCR: Group A Strep by PCR: NOT DETECTED

## 2022-12-28 LAB — SARS CORONAVIRUS 2 BY RT PCR: SARS Coronavirus 2 by RT PCR: NEGATIVE

## 2022-12-28 MED ORDER — PREDNISONE 10 MG (21) PO TBPK: ORAL_TABLET | Freq: Every day | ORAL | 0 refills | Status: DC

## 2022-12-28 MED ORDER — AMOXICILLIN 875 MG PO TABS: 875.0000 mg | ORAL_TABLET | Freq: Two times a day (BID) | ORAL | 0 refills | Status: AC

## 2022-12-28 MED ORDER — BENZONATATE 100 MG PO CAPS: 100.0000 mg | ORAL_CAPSULE | Freq: Three times a day (TID) | ORAL | 0 refills | Status: DC | PRN

## 2022-12-28 MED ORDER — PROMETHAZINE-DM 6.25-15 MG/5ML PO SYRP: 5.0000 mL | ORAL_SOLUTION | Freq: Every evening | ORAL | 0 refills | Status: DC | PRN

## 2022-12-28 NOTE — Discharge Instructions (Signed)
Your strep test is negative. We will contact you if your COVID/influenza test is positive.  Please quarantine while you wait for the results.  If your test is negative you may resume normal activities.  If your test is positive please continue to quarantine for at least 5 days from your symptom onset or until you are without a fever for at least 24 hours after the medications.    If your were prescribed medication, stop by the pharmacy to pick them up.   You can take Tylenol and/or Ibuprofen as needed for fever reduction and pain relief.    For cough: honey 1/2 to 1 teaspoon (you can dilute the honey in water or another fluid).  You can also use guaifenesin and dextromethorphan for cough. You can use a humidifier for chest congestion and cough.  If you don't have a humidifier, you can sit in the bathroom with the hot shower running.      For sore throat: try warm salt water gargles, Mucinex sore throat cough drops or cepacol lozenges, throat spray, warm tea or water with lemon/honey, popsicles or ice, or OTC cold relief medicine for throat discomfort. You can also purchase chloraseptic spray at the pharmacy or dollar store.   For congestion: take a daily anti-histamine like Zyrtec, Claritin, and a oral decongestant, such as pseudoephedrine.  You can also use Flonase 1-2 sprays in each nostril daily. Afrin is also a good option, if you do not have high blood pressure.    It is important to stay hydrated: drink plenty of fluids (water, gatorade/powerade/pedialyte, juices, or teas) to keep your throat moisturized and help further relieve irritation/discomfort.    Return or go to the Emergency Department if symptoms worsen or do not improve in the next few days

## 2022-12-28 NOTE — ED Provider Notes (Signed)
[facility]    CSN: [id number] Arrival date & time: 12/28/22  0809      History   Chief Complaint Chief Complaint  Patient presents with   Cough    HPI Samantha Ramsey is a 27 y.o. female.   HPI   [name] presents for cough for the a while and has a sore throat with nasal congestion. The throat pain started this week. Has pain with swallowing.  No chest pain or chest tightness. Took a COVID test 2 weeks ago. Delsym, Sudafed, Zyrtec without relief. No known sick contacts. Says it is cold at [place].    Fever : no  Chills: no Sore throat: yes   Cough: yes Sputum: no Nasal congestion: ys Rhinorrhea: yes Myalgias: no Appetite: normal  Hydration: normal  Abdominal pain: no Nausea: no Vomiting: no Diarrhea: No Rash: No Sleep disturbance: yes Headache: no     Past Medical History:  Diagnosis Date   Chlamydia 07/2018   treated   Gonorrhea    12/15/2018, 09/2018.  Treated   History of COVID-19    HSV-1 (herpes simplex virus 1) infection    Serology postiive only. Has never had outbreak   Wears contact lenses    Yeast vaginitis     Patient Active Problem List   Diagnosis Date Noted   Iliotibial band syndrome of left side 07/03/2021   History of miscarriage 06/27/2020   LGSIL on Pap smear of cervix 12/08/2019   Human herpes simplex virus type 1 (HSV-1) DNA detected 12/03/2019   Yeast infection of the vagina 03/17/2018    Past Surgical History:  Procedure Laterality Date   DILATION AND EVACUATION N/A 04/13/2020   Procedure: DILATATION AND EVACUATION;  Surgeon: Rubie Maid, MD;  Location: [facility];  Service: Gynecology;  Laterality: N/A;   MINOR EXCISION EAR CANAL MASS Left 11/01/2021   Procedure: EXCISION LEFT EAR CANAL MASS;  Surgeon: Margaretha Sheffield, MD;  Location: [facility];  Service: ENT;  Laterality: Left;   WISDOM TOOTH EXTRACTION  10/2016   all four;     OB History     Gravida  2   Para  1   Term  1   Preterm  0   AB   1   Living  1      SAB  1   IAB  0   Ectopic  0   Multiple  0   Live Births  1            Home Medications    Prior to Admission medications   Medication Sig Start Date End Date Taking? Authorizing Provider  amoxicillin (AMOXIL) 875 MG tablet Take 1 tablet (875 mg total) by mouth 2 (two) times daily for 7 days. 12/28/22 01/04/23 Yes Azara Gemme, DO  predniSONE (STERAPRED UNI-PAK 21 TAB) 10 MG (21) TBPK tablet Take by mouth daily. Take 6 tabs by mouth daily for 1, then 5 tabs for 1 day, then 4 tabs for 1 day, then 3 tabs for 1 day, then 2 tabs for 1 day, then 1 tab for 1 day. 12/28/22  Yes Tremaine Earwood, DO  benzonatate (TESSALON) 100 MG capsule Take 1 capsule (100 mg total) by mouth every 8 (eight) hours as needed for cough. 12/28/22   Shaquia Berkley, Ronnette Juniper, DO  fluticasone (FLONASE) 50 MCG/ACT nasal spray Place 1 spray into both nostrils daily for 3 days. 08/23/22 08/26/22  Teodora Medici, FNP  levonorgestrel (KYLEENA) 19.5 MG IUD 1 each by Intrauterine  route once. Inserted 07/10/2021    [provider]  promethazine-dextromethorphan (PROMETHAZINE-DM) 6.25-15 MG/5ML syrup Take 5 mLs by mouth at bedtime as needed for cough. 12/28/22   Lyndee Hensen, DO    Family History Family History  Problem Relation Age of Onset   Hypertension Mother    Healthy Father    Cervical cancer Maternal Grandmother    Ovarian cancer Maternal Grandmother    Breast cancer Neg Hx    Colon cancer Neg Hx     Social History Social History   Tobacco Use   Smoking status: Never   Smokeless tobacco: Never  Vaping Use   Vaping Use: Never used  Substance Use Topics   Alcohol use: Not Currently    Alcohol/week: 1.0 standard drink of alcohol    Types: 1 Standard drinks or equivalent per week   Drug use: No     Allergies   Flagyl [metronidazole]   Review of Systems Review of Systems: negative unless otherwise stated in HPI.      Physical Exam Triage Vital Signs ED Triage  Vitals  Enc Vitals Group     BP      Pulse      Resp      Temp      Temp src      SpO2      Weight      Height      Head Circumference      Peak Flow      Pain Score      Pain Loc      Pain Edu?      Excl. in [place]?    No data found.  Updated Vital Signs BP 110/84 (BP Location: Left Arm)   Pulse 97   Temp 98.5 F (36.9 C) (Oral)   Resp 16   Ht 4' 11"$  (1.499 m)   Wt 68 kg   LMP 12/27/2022   SpO2 100%   BMI 30.30 kg/m   Visual Acuity Right Eye Distance:   Left Eye Distance:   Bilateral Distance:    Right Eye Near:   Left Eye Near:    Bilateral Near:     Physical Exam GEN:     alert, non-ill appearing female in no distress    HENT:  mucus membranes moist, oropharyngeal without lesions, or exudate, 1+ tonsillar hypertrophy, mild oropharyngeal erythema, clear nasal discharge, bilateral TM normal EYES:   pupils equal and reactive, no scleral injection or discharge NECK:  normal ROM, no meningismus   RESP:  no increased work of breathing, clear to auscultation bilaterally CVS:   regular rate and rhythm Skin:   warm and dry, no rash on visible skin    UC Treatments / Results  Labs (all labs ordered are listed, but only abnormal results are displayed) Labs Reviewed  GROUP A STREP BY PCR  SARS CORONAVIRUS 2 BY RT PCR  RAPID INFLUENZA A&B ANTIGENS    EKG   Radiology No results found.  Procedures Procedures (including critical care time)  Medications Ordered in UC Medications - No data to display  Initial Impression / Assessment and Plan / UC Course  I have reviewed the triage vital signs and the nursing notes.  Pertinent labs & imaging results that were available during my care of the patient were reviewed by me and considered in my medical decision making (see chart for details).       Pt is a 27 y.o. female who presents for 2 weeks of  cough that is not improving.  Avrielle is  afebrile here without recent antipyretics. Satting well on room air.  Overall pt is  non-toxic appearing, well hydrated, without respiratory distress. Pulmonary exam is unremarkable.  After shared decision making, we will not pursue chest x-ray at this time as it currently would not change management.  COVID  and influenza testing  obtained as she does have a few new respiratory symptoms. Strep PCR negative. COVID and infleunza were negative.    Treat acute bronchitis with steroids and antibiotics as below.  Tessalon Perles and Promethazine DM cough syrup given for cough and allow patient to rest.  Typical duration of symptoms discussed. Return and ED precautions given and patient voiced understanding.   Discussed MDM, treatment plan and plan for follow-up with patient who agrees with plan.      Final Clinical Impressions(s) / UC Diagnoses   Final diagnoses:  URI with cough and congestion     Discharge Instructions      Your strep test is negative. We will contact you if your COVID/influenza test is positive.  Please quarantine while you wait for the results.  If your test is negative you may resume normal activities.  If your test is positive please continue to quarantine for at least 5 days from your symptom onset or until you are without a fever for at least 24 hours after the medications.    If your were prescribed medication, stop by the pharmacy to pick them up.   You can take Tylenol and/or Ibuprofen as needed for fever reduction and pain relief.    For cough: honey 1/2 to 1 teaspoon (you can dilute the honey in water or another fluid).  You can also use guaifenesin and dextromethorphan for cough. You can use a humidifier for chest congestion and cough.  If you don't have a humidifier, you can sit in the bathroom with the hot shower running.      For sore throat: try warm salt water gargles, Mucinex sore throat cough drops or cepacol lozenges, throat spray, warm tea or water with lemon/honey, popsicles or ice, or OTC cold relief medicine for throat  discomfort. You can also purchase chloraseptic spray at the pharmacy or dollar store.   For congestion: take a daily anti-histamine like Zyrtec, Claritin, and a oral decongestant, such as pseudoephedrine.  You can also use Flonase 1-2 sprays in each nostril daily. Afrin is also a good option, if you do not have high blood pressure.    It is important to stay hydrated: drink plenty of fluids (water, gatorade/powerade/pedialyte, juices, or teas) to keep your throat moisturized and help further relieve irritation/discomfort.    Return or go to the Emergency Department if symptoms worsen or do not improve in the next few days      ED Prescriptions     Medication Sig Dispense Auth. Provider   promethazine-dextromethorphan (PROMETHAZINE-DM) 6.25-15 MG/5ML syrup Take 5 mLs by mouth at bedtime as needed for cough. 118 mL Ladye Macnaughton, DO   benzonatate (TESSALON) 100 MG capsule Take 1 capsule (100 mg total) by mouth every 8 (eight) hours as needed for cough. 21 capsule Prestyn Stanco, DO   amoxicillin (AMOXIL) 875 MG tablet Take 1 tablet (875 mg total) by mouth 2 (two) times daily for 7 days. 14 tablet Matas Burrows, DO   predniSONE (STERAPRED UNI-PAK 21 TAB) 10 MG (21) TBPK tablet Take by mouth daily. Take 6 tabs by mouth daily for 1, then 5 tabs for  1 day, then 4 tabs for 1 day, then 3 tabs for 1 day, then 2 tabs for 1 day, then 1 tab for 1 day. 21 tablet Lyndee Hensen, DO      PDMP not reviewed this encounter.   Lyndee Hensen, DO 12/28/22 [phone]

## 2022-12-28 NOTE — ED Triage Notes (Signed)
Client presents with cough, sore throat and congestion x 2 weeks.

## 2023-01-10 ENCOUNTER — Encounter: Payer: 59 | Admitting: Obstetrics and Gynecology

## 2023-02-03 ENCOUNTER — Encounter: Payer: Self-pay | Admitting: Family Medicine

## 2023-02-04 ENCOUNTER — Other Ambulatory Visit (HOSPITAL_COMMUNITY)
Admission: RE | Admit: 2023-02-04 | Discharge: 2023-02-04 | Disposition: A | Discharge status: Home / Self Care | Payer: Managed Care, Other (non HMO) | Source: Ambulatory Visit | Attending: Family Medicine | Admitting: Family Medicine

## 2023-02-04 ENCOUNTER — Ambulatory Visit (INDEPENDENT_AMBULATORY_CARE_PROVIDER_SITE_OTHER): Payer: Managed Care, Other (non HMO) | Admitting: Family Medicine

## 2023-02-04 ENCOUNTER — Encounter: Payer: Self-pay | Admitting: Family Medicine

## 2023-02-04 VITALS — BP 118/78 | HR 92 | Resp 16 | Ht 59.0 in | Wt 157.0 lb

## 2023-02-04 DIAGNOSIS — Z113 Encounter for screening for infections with a predominantly sexual mode of transmission: Secondary | ICD-10-CM

## 2023-02-04 DIAGNOSIS — K645 Perianal venous thrombosis: Secondary | ICD-10-CM | POA: Diagnosis not present

## 2023-02-04 NOTE — Progress Notes (Signed)
Name: Samantha Ramsey   MRN: [id number]    DOB: 1995/11/21   Date:02/04/2023       Progress Note  Subjective  Chief Complaint  Hemorrhoids/ STD Screen  HPI  Hemorrhoids: patient developed hemorrhoids during pregnancy a couple of years ago. She states usually not painful but a few days ago one of the areas was swollen and painful, she does not recall having to strain, denies change in bowel movements or blood in stools. Patient states initially it was painful to sit down. She asked me about hemorrhoid banding. She states symptoms are improving gradually   STI screen: she asked to have STI check today  Patient Active Problem List   Diagnosis Date Noted   Iliotibial band syndrome of left side 07/03/2021   History of miscarriage 06/27/2020   LGSIL on Pap smear of cervix 12/08/2019   Human herpes simplex virus type 1 (HSV-1) DNA detected 12/03/2019   Yeast infection of the vagina 03/17/2018    Past Surgical History:  Procedure Laterality Date   DILATION AND EVACUATION N/A 04/13/2020   Procedure: DILATATION AND EVACUATION;  Surgeon: Rubie Maid, MD;  Location: [facility];  Service: Gynecology;  Laterality: N/A;   MINOR EXCISION EAR CANAL MASS Left 11/01/2021   Procedure: EXCISION LEFT EAR CANAL MASS;  Surgeon: Margaretha Sheffield, MD;  Location: [place];  Service: ENT;  Laterality: Left;   WISDOM TOOTH EXTRACTION  10/2016   all four;     Family History  Problem Relation Age of Onset   Hypertension Mother    Healthy Father    Cervical cancer Maternal Grandmother    Ovarian cancer Maternal Grandmother    Breast cancer Neg Hx    Colon cancer Neg Hx     Social History   Tobacco Use   Smoking status: Never   Smokeless tobacco: Never  Substance Use Topics   Alcohol use: Not Currently    Alcohol/week: 1.0 standard drink of alcohol    Types: 1 Standard drinks or equivalent per week     Current Outpatient Medications:    levonorgestrel (KYLEENA) 19.5 MG IUD, 1 each by  Intrauterine route once. Inserted 07/10/2021, Disp: , Rfl:    benzonatate (TESSALON) 100 MG capsule, Take 1 capsule (100 mg total) by mouth every 8 (eight) hours as needed for cough. (Patient not taking: Reported on 02/04/2023), Disp: 21 capsule, Rfl: 0   predniSONE (STERAPRED UNI-PAK 21 TAB) 10 MG (21) TBPK tablet, Take by mouth daily. Take 6 tabs by mouth daily for 1, then 5 tabs for 1 day, then 4 tabs for 1 day, then 3 tabs for 1 day, then 2 tabs for 1 day, then 1 tab for 1 day. (Patient not taking: Reported on 02/04/2023), Disp: 21 tablet, Rfl: 0   promethazine-dextromethorphan (PROMETHAZINE-DM) 6.25-15 MG/5ML syrup, Take 5 mLs by mouth at bedtime as needed for cough. (Patient not taking: Reported on 02/04/2023), Disp: 118 mL, Rfl: 0  Allergies  Allergen Reactions   Flagyl [Metronidazole] Nausea And Vomiting    I personally reviewed active problem list, medication list, allergies, family history, social history, health maintenance with the patient/caregiver today.   ROS  Ten systems reviewed and is negative except as mentioned in HPI   Objective  Vitals:   02/04/23 1553  BP: 118/78  Pulse: 92  Resp: 16  SpO2: 99%  Weight: 157 lb (71.2 kg)  Height: 4\' 11"  (1.499 m)    Body mass index is 31.71 kg/m.  Physical Exam  Constitutional:  Patient appears well-developed and well-nourished. Obese  No distress.  HEENT: head atraumatic, normocephalic, pupils equal and reactive to light, neck supple, Cardiovascular: Normal rate, regular rhythm and normal heart sounds.  No murmur heard. No BLE edema. Pulmonary/Chest: Effort normal and breath sounds normal. No respiratory distress. Abdominal: Soft.  There is no tenderness.Rectal exam showed two skin tags and one thrombosed hemorrhoid ( tender to touch) at 3 o'clock position  Psychiatric: Patient has a normal mood and affect. behavior is normal. Judgment and thought content normal.   Recent Results (from the past 2160 hour(s))  Group A Strep  by PCR     Status: None   Collection Time: 12/28/22  8:24 AM   Specimen: Throat; Sterile Swab  Result Value Ref Range   Group A Strep by PCR NOT DETECTED NOT DETECTED    Comment: Performed at [facility], [address], [place], [place] [address]  SARS Coronavirus 2 by RT PCR (hospital order, performed in [place] hospital lab) *cepheid single result test* Anterior Nasal Swab     Status: None   Collection Time: 12/28/22  9:01 AM   Specimen: Anterior Nasal Swab  Result Value Ref Range   SARS Coronavirus 2 by RT PCR NEGATIVE NEGATIVE    Comment: (NOTE) SARS-CoV-2 target nucleic acids are NOT DETECTED.  The SARS-CoV-2 RNA is generally detectable in upper and lower respiratory specimens during the acute phase of infection. The lowest concentration of SARS-CoV-2 viral copies this assay can detect is 250 copies / mL. A negative result does not preclude SARS-CoV-2 infection and should not be used as the sole basis for treatment or other patient management decisions.  A negative result may occur with improper specimen collection / handling, submission of specimen other than nasopharyngeal swab, presence of viral mutation(s) within the areas targeted by this assay, and inadequate number of viral copies (<250 copies / mL). A negative result must be combined with clinical observations, patient history, and epidemiological information.  Fact Sheet for Patients:   https://www.patel.info/  Fact Sheet for Healthcare Providers: https://hall.com/  This test is not yet approved or  cleared by the [place] FDA and has been authorized for detection and/or diagnosis of SARS-CoV-2 by FDA under an Emergency Use Authorization (EUA).  This EUA will remain in effect (meaning this test can be used) for the duration of the COVID-19 declaration under Section 564(b)(1) of the Act, 21 U.S.C. section 360bbb-3(b)(1), unless the authorization is  terminated or revoked sooner.  Performed at [facility], [address] [place], [place] [address]   Rapid Influenza A&B Antigens     Status: None   Collection Time: 12/28/22  9:01 AM   Specimen: Respiratory  Result Value Ref Range   Influenza A ([facility]) NEGATIVE NEGATIVE   Influenza B ([facility]) NEGATIVE NEGATIVE    Comment: Negative results do not exclude influenza virus infection, and influenza should still be considered if clinical suspicion is high. Performed at [facility], [address] [place],  [address]     PHQ2/9:    02/04/2023    3:52 PM 07/12/2022    2:58 PM 03/19/2022    3:40 PM 02/06/2022    8:45 AM 01/09/2022    8:10 PM  Depression screen PHQ 2/9  Decreased Interest 0 0 0 1 3  Down, Depressed, Hopeless 0 0 0 0 3  PHQ - 2 Score 0 0 0 1 6  Altered sleeping 0 0 0 1 2  Tired,  decreased energy 0 0 0 1 2  Change in appetite 0 0 0 1 2  Feeling bad or failure about yourself  0 0 0 0 2  Trouble concentrating 0 0 0 1 1  Moving slowly or fidgety/restless 0 0 0 0 0  Suicidal thoughts 0 0 0 0 0  PHQ-9 Score 0 0 0 5 15  Difficult doing work/chores  Not difficult at all Not difficult at all Somewhat difficult Somewhat difficult    phq 9 is negative   Fall Risk:    02/04/2023    3:52 PM 07/12/2022    2:58 PM 03/19/2022    3:40 PM 02/06/2022    8:42 AM 01/09/2022    3:14 PM  Fall Risk   Falls in the past year? 0 0 0 0 0  Number falls in past yr: 0 0 0 0 0  Injury with Fall? 0 0 0 0 0  Risk for fall due to : No Fall Risks      Follow up Falls prevention discussed          Functional Status Survey: Is the patient deaf or have difficulty hearing?: No Does the patient have difficulty seeing, even when wearing glasses/contacts?: No Does the patient have difficulty concentrating, remembering, or making decisions?: No Does the patient have difficulty walking or climbing stairs?: No Does the patient have difficulty dressing or bathing?:  No Does the patient have difficulty doing errands alone such as visiting a doctor's office or shopping?: No    Assessment & Plan  1. Thrombosed external hemorrhoid  Explained since thrombosed we could open it surgically, however she states pain is improving and she is okay trying conservative measures such as sitz baths.   Hemorrhoid banding is indicated for internal hemorrhoids and she has an external hemorrhoids. She also plans of having more children.   Discussed importance of high fiber diet and soft stools.   2. Screen for STD (sexually transmitted disease)  - Cervicovaginal ancillary only

## 2023-02-04 NOTE — Patient Instructions (Signed)
Hemorrhoids Hemorrhoids are swollen veins in and around the rectum or the opening of the butt (anus). There are two types of hemorrhoids: Internal. These occur in the veins just inside the rectum. They may poke through to the outside and become irritated and painful. External. These occur in the veins outside the anus. They can be felt as a painful swelling or hard lump near the anus. Most hemorrhoids do not cause severe problems. Often, they can be treated at home with diet and lifestyle changes. If home treatments do not help, you may need a procedure to shrink or remove the hemorrhoids. What are the causes? Hemorrhoids are caused by pressure near the anus. This pressure may be caused by: Constipation or diarrhea. Straining to poop. Pregnancy. Obesity. Sitting or riding a bike for a long time. Heavy lifting or other things that cause you to strain. Anal sex. What are the signs or symptoms? Symptoms of this condition include: Pain. Anal itching or irritation. Bleeding from the rectum. Leakage of poop (stool). Swelling of the anus. One or more lumps around the anus. How is this diagnosed? Hemorrhoids can often be diagnosed through a visual exam. Other exams or tests may also be done, such as: A digital rectal exam. This is when your health care provider feels inside your rectum with a gloved finger. Anoscope. This is an exam of the anus using a small tube. A blood test, if you have lost a lot of blood. A sigmoidoscopy or colonoscopy. These are tests to look inside the colon using a tube with a camera on the end. How is this treated? In most cases, hemorrhoids can be treated at home with diet and lifestyle changes. If these changes do not help, you may need to have a procedure done. These procedures can make the hemorrhoids smaller or fully remove them. Common procedures include: Rubber band ligation. Rubber bands are placed at the base of the hemorrhoids to cut off their blood  supply. Sclerotherapy. Medicine is put into the hemorrhoids to shrink them. Infrared coagulation. A type of light energy is used to get rid of the hemorrhoids. Hemorrhoidectomy surgery. The hemorrhoids are removed during surgery. Then, the veins that supply them are tied off. Stapled hemorrhoidopexy surgery. The base of the hemorrhoid is stapled to the wall of the rectum. Follow these instructions at home: Medicines Take over-the-counter and prescription medicines only as told by your provider. Use medicated creams or medicines that are put in the rectum (suppositories) as told by your provider. Eating and drinking  Eat foods that are high in fiber, such as beans, whole grains, and fresh fruits and vegetables. Ask your provider about taking products that have fiber added to them (fiber supplements). Reduce the amount of fat in your diet. You can do this by eating low-fat dairy products, eating less red meat, and avoiding processed foods. Drink enough fluid to keep your pee (urine) pale yellow. Managing pain and swelling  Take warm sitz baths for 20 minutes, 3-4 times a day. This can help ease pain and discomfort. You may do this in a bathtub or you can use a portable sitz bath that fits over the toilet. If told, put ice on the affected area. It may help to use ice packs between sitz baths. Put ice in a plastic bag. Place a towel between your skin and the bag. Leave the ice on for 20 minutes, 2-3 times a day. If your skin turns bright red, remove the ice right away to prevent   skin damage. The risk of damage is higher if you cannot feel pain, heat, or cold. General instructions Exercise. Ask your provider how much and what kind of exercise is best for you. In general, you should do moderate exercise for at least 30 minutes on most days of the week (150 minutes each week). You may want to try walking, biking, or yoga. Go to the bathroom when you have the urge to poop. Do not wait. Avoid  straining to poop. Keep the anus dry and clean. Use wet toilet paper or moist towelettes after you poop. Do not sit on the toilet for a long time. This can increase blood pooling and pain. Where to find more information National Institute of Diabetes and Digestive and Kidney Diseases: niddk.nih.gov Contact a health care provider if: You have more pain and swelling that do not get better with treatment. You have trouble pooping or you are not able to poop. You have pain or inflammation outside the area of the hemorrhoids. Get help right away if: You are bleeding from your rectum and you cannot get it to stop. This information is not intended to replace advice given to you by your health care provider. Make sure you discuss any questions you have with your health care provider. Document Revised: 07/17/2022 Document Reviewed: 07/17/2022 Elsevier Patient Education  2023 Elsevier Inc.  

## 2023-02-06 LAB — CERVICOVAGINAL ANCILLARY ONLY
Chlamydia: NEGATIVE
Comment: NEGATIVE
Comment: NEGATIVE
Comment: NORMAL
Neisseria Gonorrhea: NEGATIVE
Trichomonas: NEGATIVE

## 2023-02-13 NOTE — Progress Notes (Signed)
GYNECOLOGY ANNUAL PHYSICAL EXAM PROGRESS NOTE Subjective:    Samantha Ramsey is a 27 y.o. G36P1011 female who presents for an annual exam.  She denies complaints today. She is sexually active. The patient wears seatbelts: yes. The patient participates in regular exercise: no. Has the patient ever been transfused or tattooed?: no. The patient reports that there is not domestic violence in her life.    Gynecologic History Menarche age: 78 No LMP recorded (lmp unknown). (Menstrual status: IUD).   Contraception: Kyleena inserted 06/2022. History of STI's: History of Chlamydia and Gonorrhea in 2019.  Last Pap: 01/09/2022. Results were: Normal. History of abnormal pap smear in 11/2019 (LGSIL).     Upstream - 02/18/23 1429       Pregnancy Intention Screening   Does the patient want to become pregnant in the next year? No    Does the patient's partner want to become pregnant in the next year? No    Would the patient like to discuss contraceptive options today? No      Contraception Wrap Up   Current Method IUD or IUS    End Method IUD or IUS    Contraception Counseling Provided No            The pregnancy intention screening data noted above was reviewed. Potential methods of contraception were discussed. The patient elected to proceed with IUD or IUS.  OB History  Gravida Para Term Preterm AB Living  2 1 1  0 1 1  SAB IAB Ectopic Multiple Live Births  1 0 0 0 1    # Outcome Date GA Lbr Len/2nd Weight Sex Delivery Anes PTL Lv  2 Term 05/15/21 [redacted]w[redacted]d 07:46 / 00:50 7 lb 2.6 oz (3.25 kg) M Vag-Spont EPI  LIV     Name: Samantha Ramsey     Apgar1: 8  Apgar5: 9  1 SAB 03/2020 [redacted]w[redacted]d       FD    Past Medical History:  Diagnosis Date   Chlamydia 07/2018   treated   Gonorrhea    12/15/2018, 09/2018.  Treated   History of COVID-19    HSV-1 (herpes simplex virus 1) infection    Serology postiive only. Has never had outbreak   Wears contact lenses    Yeast vaginitis     Past  Surgical History:  Procedure Laterality Date   DILATION AND EVACUATION N/A 04/13/2020   Procedure: DILATATION AND EVACUATION;  Surgeon: Rubie Maid, MD;  Location: [facility];  Service: Gynecology;  Laterality: N/A;   MINOR EXCISION EAR CANAL MASS Left 11/01/2021   Procedure: EXCISION LEFT EAR CANAL MASS;  Surgeon: Margaretha Sheffield, MD;  Location: [place];  Service: ENT;  Laterality: Left;   WISDOM TOOTH EXTRACTION  10/2016   all four;     Family History  Problem Relation Age of Onset   Hypertension Mother    Healthy Father    Cervical cancer Maternal Grandmother    Ovarian cancer Maternal Grandmother    Breast cancer Neg Hx    Colon cancer Neg Hx     Social History   Socioeconomic History   Marital status: Single    Spouse name: Not on file   Number of children: 0   Years of education: Not on file   Highest education level: Some college, no degree  Occupational History   Occupation: full time Ship broker    Occupation: works part time     Comment: Therapist, art as a host   Tobacco  Use   Smoking status: Never   Smokeless tobacco: Never  Vaping Use   Vaping Use: Never used  Substance and Sexual Activity   Alcohol use: Not Currently    Alcohol/week: 1.0 standard drink    Types: 1 Standard drinks or equivalent per week   Drug use: No   Sexual activity: Not Currently    Partners: Male    Birth control/protection: I.U.D.  Other Topics Concern   Not on file  Social History Narrative   She is going [employer] and is getting a degree in Designer, fashion/clothing    Social Determinants of Health   Financial Resource Strain: Not on file  Food Insecurity: Not on file  Transportation Needs: Not on file  Physical Activity: Not on file  Stress: Not on file  Social Connections: Not on file  Intimate Partner Violence: Not on file    Current Outpatient Medications on File Prior to Visit  Medication Sig Dispense Refill   levonorgestrel (KYLEENA) 19.5 MG IUD 1 each by  Intrauterine route once. Inserted 07/10/2021     No current facility-administered medications on file prior to visit.    Allergies  Allergen Reactions   Flagyl [Metronidazole] Nausea And Vomiting      Review of Systems Constitutional: negative for chills, fatigue, fevers and sweats Eyes: negative for irritation, redness and visual disturbance Ears, nose, mouth, throat, and face: negative for hearing loss, nasal congestion, snoring and tinnitus Respiratory: negative for asthma, cough, sputum Cardiovascular: negative for chest pain, dyspnea, exertional chest pressure/discomfort, irregular heart beat, palpitations and syncope Gastrointestinal: negative for abdominal pain, change in bowel habits, nausea and vomiting Genitourinary: negative for abnormal menstrual periods, genital lesions, sexual problems, dysuria and urinary incontinence.   Integument/breast: negative for breast lump, and nipple discharge.  Hematologic/lymphatic: negative for bleeding and easy bruising Musculoskeletal:negative for back pain and muscle weakness Neurological: negative for dizziness, headaches, vertigo and weakness Endocrine: negative for diabetic symptoms including polydipsia, polyuria and skin dryness Allergic/Immunologic: negative for hay fever and urticaria Psychologic: positive for depression, anhedonia, sleep disturbances.  Negative for anxiety, mood swings, suicidal ideation     Objective:  Blood pressure 118/72, pulse 79, resp. rate 16, height 4\' 11"  (1.499 m), weight 158 lb 12.8 oz (72 kg), currently breastfeeding. Body mass index is 32.07 kg/m.   General Appearance:    Alert, cooperative, no distress, appears stated age, obesity (mild)  Head:    Normocephalic, without obvious abnormality, atraumatic  Eyes:    PERRL, conjunctiva/corneas clear, EOM's intact, both eyes  Ears:    Normal external ear canals, both ears  Nose:   Nares normal, septum midline, mucosa normal, no drainage or sinus  tenderness  Throat:   Lips, mucosa, and tongue normal; teeth and gums normal  Neck:   Supple, symmetrical, trachea midline, no adenopathy; thyroid: no enlargement/tenderness/nodules; no carotid bruit or JVD  Back:     Symmetric, no curvature, ROM normal, no CVA tenderness  Lungs:     Clear to auscultation bilaterally, respirations unlabored  Chest Wall:    No tenderness or deformity   Heart:    Regular rate and rhythm, S1 and S2 normal, no murmur, rub or gallop  Breast Exam:    Breasts appear normal, no suspicious masses, no skin or nipple changes or axillary nodes.  Abdomen:     Soft, non-tender, bowel sounds active all four quadrants, no masses, no organomegaly.    Genitalia:    Pelvic:external genitalia normal, vagina without lesions, or tenderness. Scant  thin white discharge noted.   Rectovaginal septum  normal. Cervix normal in appearance, no cervical motion tenderness, no adnexal masses or tenderness.  Uterus normal size, shape, mobile, regular contours, nontender.  Rectal:    Normal external sphincter.  No hemorrhoids appreciated. Internal exam not done.   Extremities:   Extremities normal, atraumatic, no cyanosis or edema  Pulses:   2+ and symmetric all extremities  Skin:   Skin color, texture, turgor normal, no rashes or lesions  Lymph nodes:   Cervical, supraclavicular, and axillary nodes normal  Neurologic:   CNII-XII intact, normal strength, sensation and reflexes throughout       02/04/2023    3:52 PM 07/12/2022    2:58 PM 03/19/2022    3:40 PM 02/06/2022    8:45 AM 01/09/2022    8:10 PM  Depression screen PHQ 2/9  Decreased Interest 0 0 0 1 3  Down, Depressed, Hopeless 0 0 0 0 3  PHQ - 2 Score 0 0 0 1 6  Altered sleeping 0 0 0 1 2  Tired, decreased energy 0 0 0 1 2  Change in appetite 0 0 0 1 2  Feeling bad or failure about yourself  0 0 0 0 2  Trouble concentrating 0 0 0 1 1  Moving slowly or fidgety/restless 0 0 0 0 0  Suicidal thoughts 0 0 0 0 0  PHQ-9 Score 0 0 0 5 15   Difficult doing work/chores  Not difficult at all Not difficult at all Somewhat difficult Somewhat difficult     Labs:  Lab Results  Component Value Date   WBC 17.1 (H) 05/16/2021   HGB 13.9 07/10/2021   HCT 42.4 07/10/2021   MCV 84.9 05/16/2021   PLT 172 05/16/2021    Lab Results  Component Value Date   CREATININE 0.71 12/01/2019   BUN 14 12/01/2019   NA 138 12/01/2019   K 3.8 12/01/2019   CL 104 12/01/2019   CO2 21 12/01/2019    Lab Results  Component Value Date   ALT 14 12/01/2019   AST 21 12/01/2019   ALKPHOS 54 12/01/2019   BILITOT <0.2 12/01/2019    No results found for: TSH   Assessment:   1. Encounter for well woman exam with routine gynecological exam   2. History of abnormal cervical Pap smear   3. Dyslipidemia   4. Obesity (BMI 30.0-34.9)   5. Screening for diabetes mellitus   6. Postpartum depression     Plan:    - Blood tests:CBC, CMP, Lipid panel.   - Breast self exam technique reviewed and patient encouraged to perform self-exam monthly. - Contraception: Kyleena IUD. Can keep in place for up to 5 years.  - Discussed healthy lifestyle modifications. - Pap smear up to date.  - Patient with h/o mild dyslipidemia prior to her last pregnancy. Patient is young with no other risk factors. Will rescreen today.  - Depression screen recently performed ~ 2 weeks ago by PCP, negative.  - Reports having recent STD screening with her PCP several weeks ago, was negative.  - COVID vaccination: declines -Follow-up in 1 year for annual exam.    Rubie Maid, MD [facility] at [facility]

## 2023-02-13 NOTE — Patient Instructions (Signed)

## 2023-02-18 ENCOUNTER — Ambulatory Visit (INDEPENDENT_AMBULATORY_CARE_PROVIDER_SITE_OTHER): Payer: Federal, State, Local not specified - PPO | Admitting: Obstetrics and Gynecology

## 2023-02-18 ENCOUNTER — Encounter: Payer: Self-pay | Admitting: Obstetrics and Gynecology

## 2023-02-18 VITALS — BP 117/71 | HR 69 | Ht 59.0 in | Wt 153.0 lb

## 2023-02-18 DIAGNOSIS — Z01419 Encounter for gynecological examination (general) (routine) without abnormal findings: Secondary | ICD-10-CM | POA: Diagnosis not present

## 2023-02-18 DIAGNOSIS — Z124 Encounter for screening for malignant neoplasm of cervix: Secondary | ICD-10-CM

## 2023-02-19 LAB — COMPREHENSIVE METABOLIC PANEL
ALT: 16 IU/L (ref 0–32)
AST: 19 IU/L (ref 0–40)
Albumin/Globulin Ratio: 1.6 (ref 1.2–2.2)
Albumin: 4.5 g/dL (ref 4.0–5.0)
Alkaline Phosphatase: 71 IU/L (ref 44–121)
BUN/Creatinine Ratio: 19 (ref 9–23)
BUN: 14 mg/dL (ref 6–20)
Bilirubin Total: 0.4 mg/dL (ref 0.0–1.2)
CO2: 23 mmol/L (ref 20–29)
Calcium: 9 mg/dL (ref 8.7–10.2)
Chloride: 101 mmol/L (ref 96–106)
Creatinine, Ser: 0.75 mg/dL (ref 0.57–1.00)
Globulin, Total: 2.8 g/dL (ref 1.5–4.5)
Glucose: 89 mg/dL (ref 70–99)
Potassium: 4 mmol/L (ref 3.5–5.2)
Sodium: 140 mmol/L (ref 134–144)
Total Protein: 7.3 g/dL (ref 6.0–8.5)
eGFR: 114 mL/min/{1.73_m2} (ref >59)

## 2023-02-19 LAB — LIPID PANEL
Chol/HDL Ratio: 3.1 ratio (ref 0.0–4.4)
Cholesterol, Total: 193 mg/dL (ref 100–199)
HDL: 63 mg/dL (ref >39)
LDL Chol Calc (NIH): 116 mg/dL — ABNORMAL HIGH (ref 0–99)
Triglycerides: 77 mg/dL (ref 0–149)
VLDL Cholesterol Cal: 14 mg/dL (ref 5–40)

## 2023-02-19 LAB — CBC
Hematocrit: 40 % (ref 34.0–46.6)
Hemoglobin: 13.3 g/dL (ref 11.1–15.9)
MCH: 30.6 pg (ref 26.6–33.0)
MCHC: 33.3 g/dL (ref 31.5–35.7)
MCV: 92 fL (ref 79–97)
Platelets: 220 10*3/uL (ref 150–450)
RBC: 4.35 x10E6/uL (ref 3.77–5.28)
RDW: 13 % (ref 11.7–15.4)
WBC: 8.6 10*3/uL (ref 3.4–10.8)

## 2023-02-24 IMAGING — CT CT TEMPORAL BONES W/O CM
2 of 6 series · 14 of 40 positions shown, 17 images · non-contrast
Comparison: No pertinent prior exam.

CLINICAL DATA: Left ear canal mass

EXAM:
CT TEMPORAL BONES WITHOUT CONTRAST
TECHNIQUE: Axial and coronal plane CT imaging of the petrous temporal bones was
performed with thin-collimation image reconstruction. No intravenous
contrast was administered. Multiplanar CT image reconstructions were
also generated.

[Series 6: cor mag right · coronal · 0.12mm/px · 3 of 167 slices shown]
[im 42/167  bone]
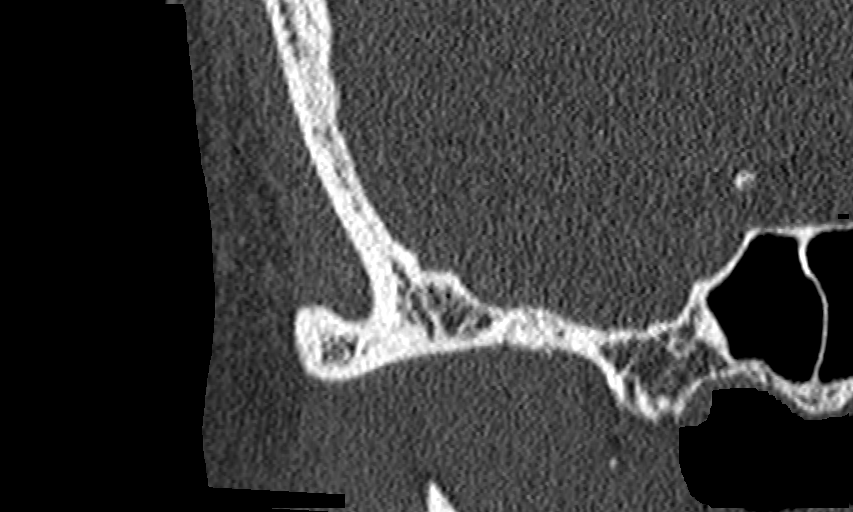
[im 84/167  bone]
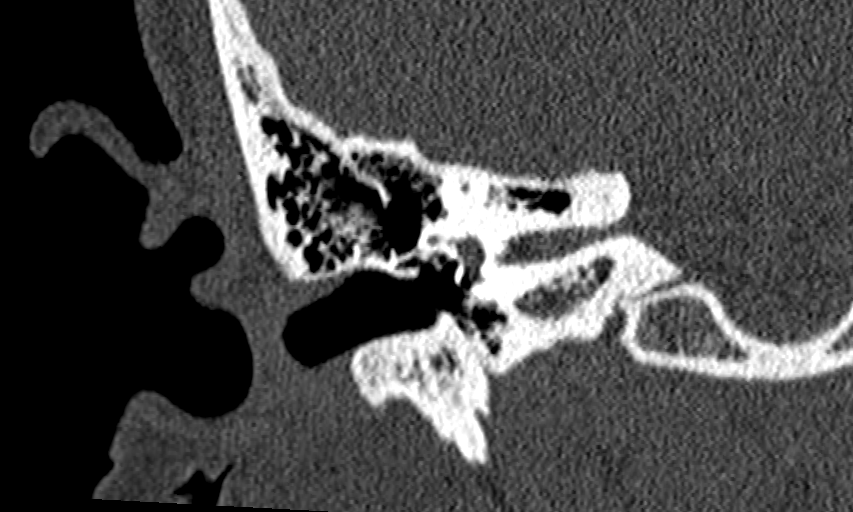
[im 125/167  bone]
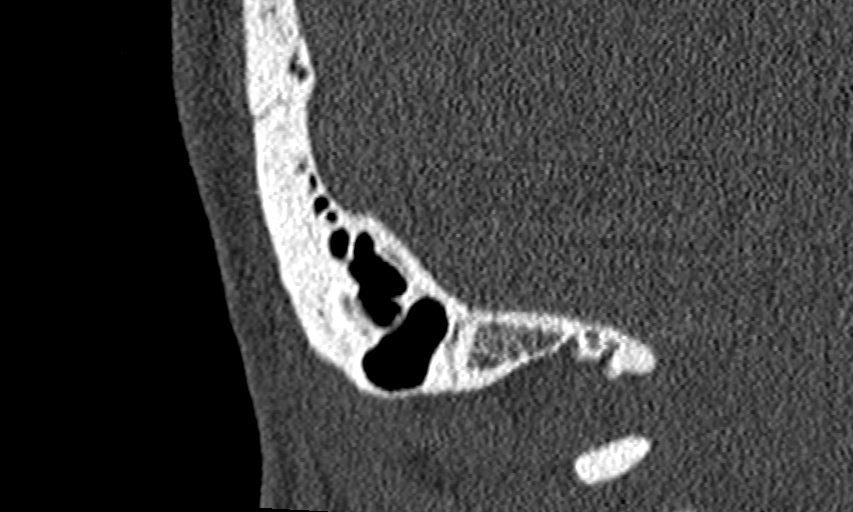

[Series 7: ax mag left · axial · 0.20mm/px · z∈[+1507,+1560]mm · 11 of 107 slices shown, 14 images]
[im 9/107  brain]
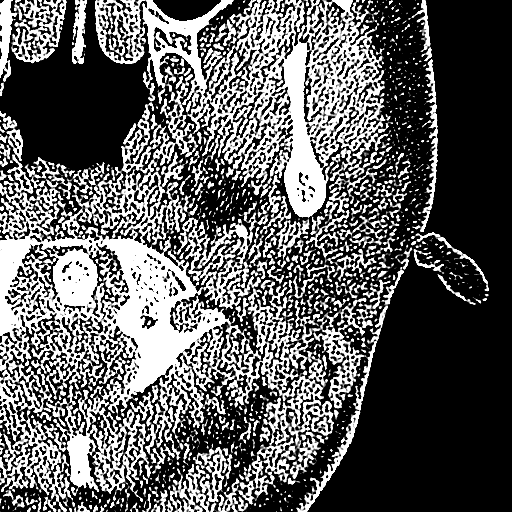
[im 9/107  bone]
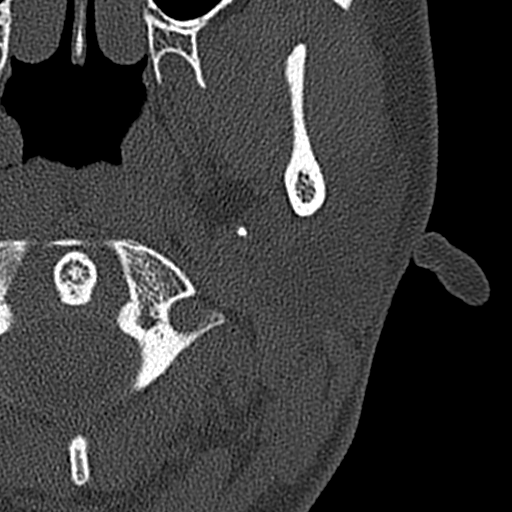
[im 18/107  bone]
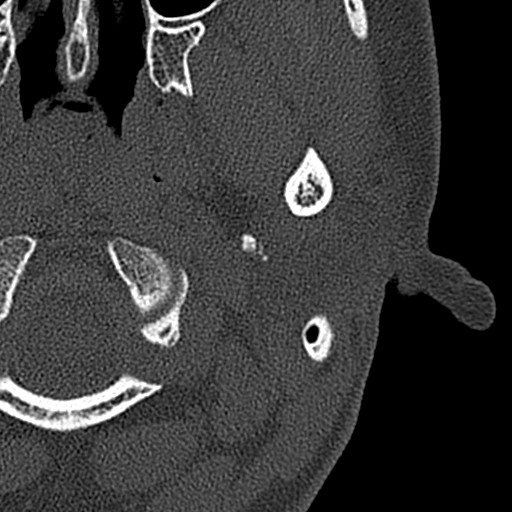
[im 27/107  bone]
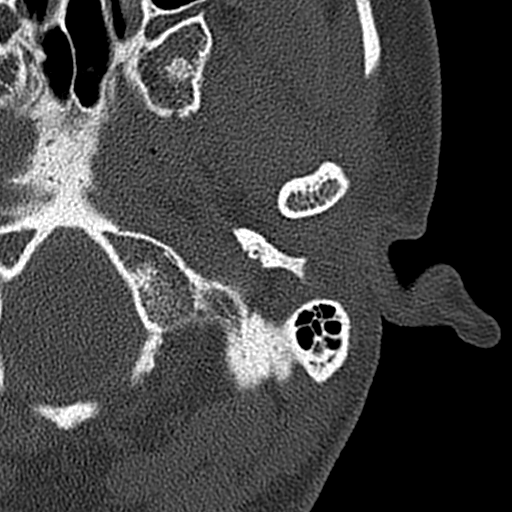
[im 36/107  bone]
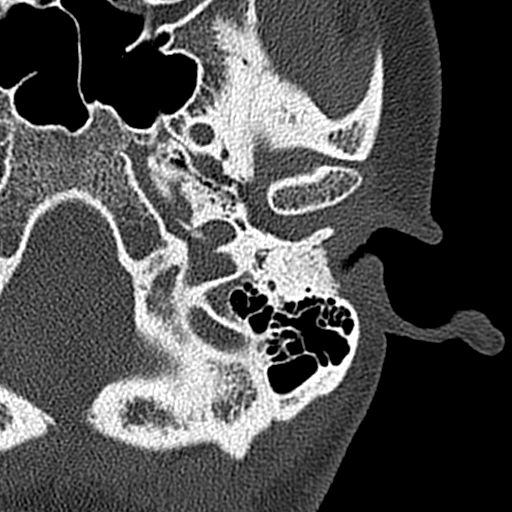
[im 45/107  brain]
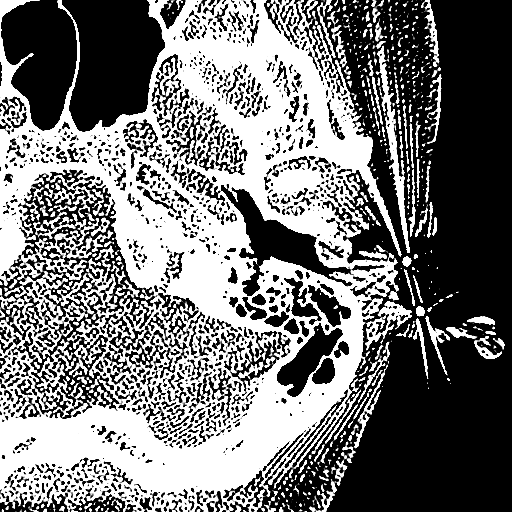
[im 45/107  bone]
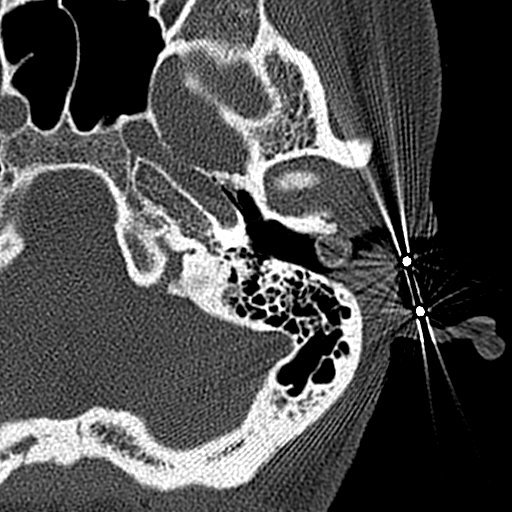
[im 54/107  bone]
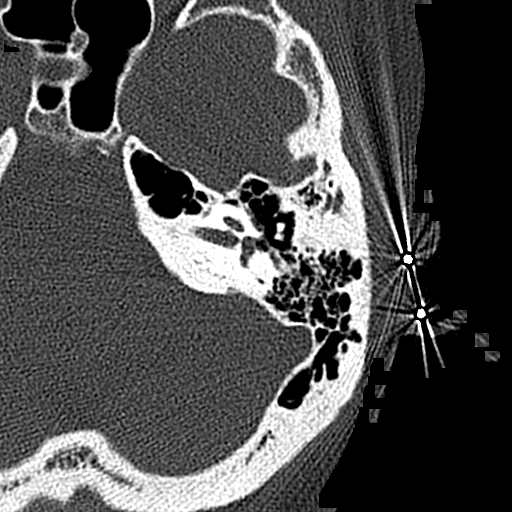
[im 62/107  bone]
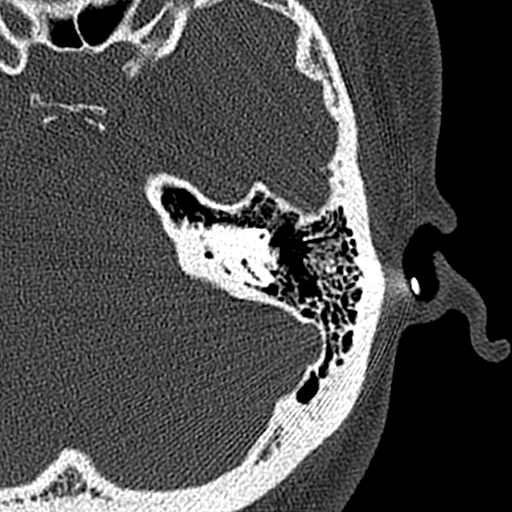
[im 71/107  bone]
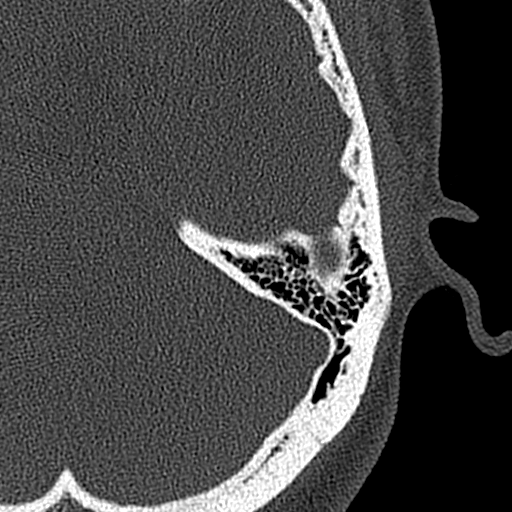
[im 80/107  brain]
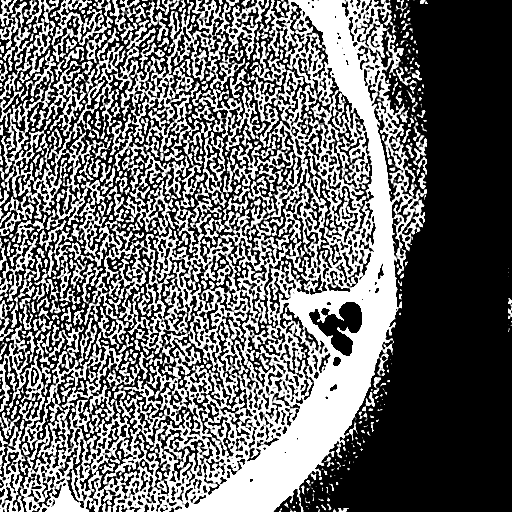
[im 80/107  bone]
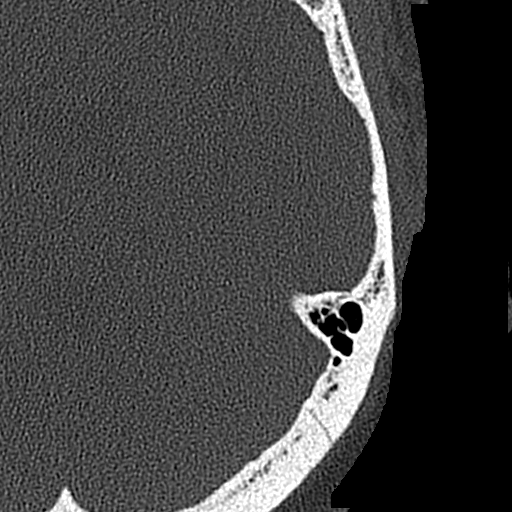
[im 89/107  bone]
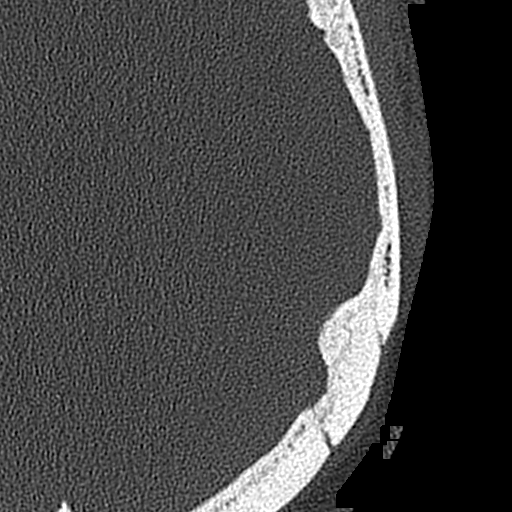
[im 98/107  bone]
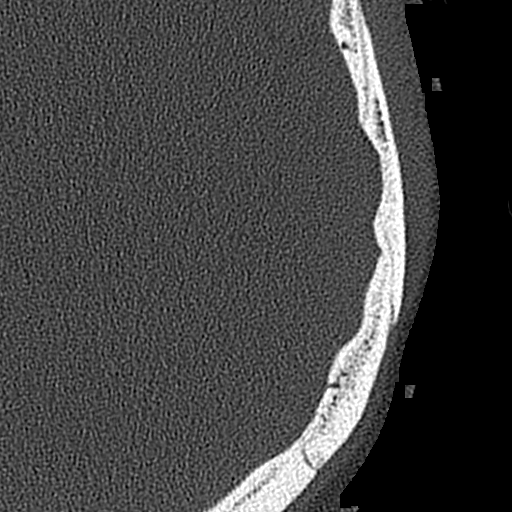

[14 of 40 positions shown; findings below may reference images not displayed]

FINDINGS: RIGHT TEMPORAL BONE

External auditory canal: Normal

Middle ear cavity: Middle ear and ossicles normal.

Inner ear structures: Cochlea and semi circular canals normal.

Internal auditory and facial nerve canals:  Normal

Mastoid air cells: Well developed and clear.

LEFT TEMPORAL BONE

External auditory canal: Mass lesion in the left external auditory
canal. This is well circumscribed and ovoid in shape and measures
approximately 4 x 7 cm. There is a thin rim of calcification in the
wall. No bony attachment to the external canal. No bony exostosis
identified. The mass is causing narrowing of the canal. Middle ear
ring in the left ear causing streak artifact.

Middle ear cavity: Middle ear is clear.  Ossicles normal.

Inner ear structures: Cochlea and semi circular canals normal.

Internal auditory and facial nerve canals:  Normal

Mastoid air cells: Well developed and clear.

Vascular: Internal auditory canal normal. Jugular bulb normal
bilaterally.

Limited intracranial:  Negative

Visible orbits/paranasal sinuses: Clear

Soft tissues: Negative
IMPRESSION: 4 x 7 mm ovoid well-circumscribed mass within the left external
auditory canal. No exostosis or bony attachment. The mass appears
cystic and well-circumscribed with calcification in the wall.
Possible foreign body. Cholesteatoma not likely given the
calcification well-circumscribed nature.

## 2023-05-05 ENCOUNTER — Encounter: Payer: Self-pay | Admitting: Obstetrics and Gynecology

## 2023-05-05 ENCOUNTER — Ambulatory Visit (INDEPENDENT_AMBULATORY_CARE_PROVIDER_SITE_OTHER): Payer: Federal, State, Local not specified - PPO | Admitting: Obstetrics and Gynecology

## 2023-05-05 VITALS — BP 102/60 | Ht 59.0 in | Wt 165.0 lb

## 2023-05-05 DIAGNOSIS — N898 Other specified noninflammatory disorders of vagina: Secondary | ICD-10-CM

## 2023-05-05 DIAGNOSIS — N9089 Other specified noninflammatory disorders of vulva and perineum: Secondary | ICD-10-CM

## 2023-05-05 LAB — POCT WET PREP WITH KOH
Clue Cells Wet Prep HPF POC: NEGATIVE
KOH Prep POC: NEGATIVE
Trichomonas, UA: NEGATIVE
Yeast Wet Prep HPF POC: NEGATIVE

## 2023-05-05 MED ORDER — FLUCONAZOLE 150 MG PO TABS: 150.0000 mg | ORAL_TABLET | Freq: Once | ORAL | 0 refills | Status: AC

## 2023-05-05 NOTE — Progress Notes (Signed)
[name], MD   Chief Complaint  Patient presents with   STD testing    Noticed a small internal lump about two days ago, not painful   Vaginal Itching    No discharge or odor x 2 days    HPI:      [name] is a 27 y.o. G2P1011 whose LMP was Patient's last menstrual period was 04/25/2023 (exact date)., presents today for vaginal itching at vaginal opening, no increased d/c or odor for 2 days. Did change soap recently. She is sexually active, no new partners for several yrs. No pain/bleeding. Neg STD testing 3/24; Neg pap 2/23. Also noticed small lump on RT labia for about 2 days, no change in size. No pain/d/c.   Patient Active Problem List   Diagnosis Date Noted   Iliotibial band syndrome of left side 07/03/2021   History of miscarriage 06/27/2020   LGSIL on Pap smear of cervix 12/08/2019   Human herpes simplex virus type 1 (HSV-1) DNA detected 12/03/2019   Yeast infection of the vagina 03/17/2018    Past Surgical History:  Procedure Laterality Date   DILATION AND EVACUATION N/A 04/13/2020   Procedure: DILATATION AND EVACUATION;  Surgeon: Hildred Laser, MD;  Location: [facility];  Service: Gynecology;  Laterality: N/A;   MINOR EXCISION EAR CANAL MASS Left 11/01/2021   Procedure: EXCISION LEFT EAR CANAL MASS;  Surgeon: Vernie Murders, MD;  Location: [facility];  Service: ENT;  Laterality: Left;   WISDOM TOOTH EXTRACTION  10/2016   all four;     Family History  Problem Relation Age of Onset   Hypertension Mother    Healthy Father    Cervical cancer Maternal Grandmother    Ovarian cancer Maternal Grandmother    Breast cancer Neg Hx    Colon cancer Neg Hx     Social History   Socioeconomic History   Marital status: Single    Spouse name: Not on file   Number of children: 0   Years of education: Not on file   Highest education level: Some college, no degree  Occupational History   Occupation: full time Consulting civil engineer    Occupation: works part  time     Comment: Architect as a host   Tobacco Use   Smoking status: Never   Smokeless tobacco: Never  Vaping Use   Vaping Use: Never used  Substance and Sexual Activity   Alcohol use: Not Currently    Alcohol/week: 1.0 standard drink of alcohol    Types: 1 Standard drinks or equivalent per week   Drug use: No   Sexual activity: Yes    Partners: Male    Birth control/protection: I.U.D.    Comment: Samantha Ramsey  Other Topics Concern   Not on file  Social History Narrative   She is going [employer] and is getting a degree in Water quality scientist    Social Determinants of Health   Financial Resource Strain: Low Risk  (12/15/2018)   Overall Financial Resource Strain (CARDIA)    Difficulty of Paying Living Expenses: Not hard at all  Food Insecurity: No Food Insecurity (12/15/2018)   Hunger Vital Sign    Worried About Running Out of Food in the Last Year: Never true    Ran Out of Food in the Last Year: Never true  Transportation Needs: No Transportation Needs (12/15/2018)   PRAPARE - Administrator, Civil Service (Medical): No    Lack of Transportation (Non-Medical): No  Physical Activity: Sufficiently Active (12/15/2018)   Exercise Vital Sign    Days of Exercise per Week: 5 days    Minutes of Exercise per Session: 30 min  Stress: No Stress Concern Present (12/15/2018)   [employer] of Occupational Health - Occupational Stress Questionnaire    Feeling of Stress : Not at all  Social Connections: Moderately Integrated (12/15/2018)   Social Connection and Isolation Panel [NHANES]    Frequency of Communication with Friends and Family: More than three times a week    Frequency of Social Gatherings with Friends and Family: More than three times a week    Attends Religious Services: 1 to 4 times per year    Active Member of [employer] or Organizations: Yes    Attends Banker Meetings: More than 4 times per year    Marital Status: Never married  Intimate Partner  Violence: Not At Risk (12/15/2018)   Humiliation, Afraid, Rape, and Kick questionnaire    Fear of Current or Ex-Partner: No    Emotionally Abused: No    Physically Abused: No    Sexually Abused: No    Outpatient Medications Prior to Visit  Medication Sig Dispense Refill   levonorgestrel (Samantha Ramsey) 19.5 MG IUD 1 each by Intrauterine route once. Inserted 07/10/2021     No facility-administered medications prior to visit.      ROS:  Review of Systems  Constitutional:  Negative for fever.  Gastrointestinal:  Negative for blood in stool, constipation, diarrhea, nausea and vomiting.  Genitourinary:  Positive for genital sores. Negative for dyspareunia, dysuria, flank pain, frequency, hematuria, urgency, vaginal bleeding, vaginal discharge and vaginal pain.  Musculoskeletal:  Negative for back pain.  Skin:  Negative for rash.   BREAST: No symptoms   OBJECTIVE:   Vitals:  BP 102/60   Ht 4\' 11"  (1.499 m)   Wt 165 lb (74.8 kg)   LMP 04/25/2023 (Exact Date)   Breastfeeding No   BMI 33.33 kg/m   Physical Exam Vitals reviewed.  Constitutional:      Appearance: She is well-developed.  Pulmonary:     Effort: Pulmonary effort is normal.  Genitourinary:    General: Normal vulva.     Pubic Area: No rash.      Labia:        Right: Lesion present. No rash or tenderness.        Left: No rash, tenderness or lesion.      Vagina: Normal. No vaginal discharge, erythema or tenderness.     Cervix: Normal.     Uterus: Normal. Not enlarged and not tender.      Adnexa: Right adnexa normal and left adnexa normal.       Right: No mass or tenderness.         Left: No mass or tenderness.      Musculoskeletal:        General: Normal range of motion.     Cervical back: Normal range of motion.  Skin:    General: Skin is warm and dry.  Neurological:     General: No focal deficit present.     Mental Status: She is alert and oriented to person, place, and time.  Psychiatric:        Mood and  Affect: Mood normal.        Behavior: Behavior normal.        Thought Content: Thought content normal.        Judgment: Judgment normal.     Results:  Results for orders placed or performed in visit on 05/05/23 (from the past 24 hour(s))  POCT Wet Prep with KOH     Status: Normal   Collection Time: 05/05/23  4:52 PM  Result Value Ref Range   Trichomonas, UA Negative    Clue Cells Wet Prep HPF POC neg    Epithelial Wet Prep HPF POC     Yeast Wet Prep HPF POC neg    Bacteria Wet Prep HPF POC     RBC Wet Prep HPF POC     WBC Wet Prep HPF POC     KOH Prep POC Negative Negative     Assessment/Plan: Vaginal itching - Plan: fluconazole (DIFLUCAN) 150 MG tablet, POCT Wet Prep with KOH; neg exam, neg wet prep. Question due to soap change. Dove sensitive skin soap/OTC hydrocortisone crm prn. Rx diflucan if sx persist/worsen. F/u prn.   Labial lesion--small papule, c/w "pimple". Reassurance, warm compresses to see if it will drain. F/u prn.    Meds ordered this encounter  Medications   fluconazole (DIFLUCAN) 150 MG tablet    Sig: Take 1 tablet (150 mg total) by mouth once for 1 dose.    Dispense:  1 tablet    Refill:  0    Order Specific Question:   Supervising Provider    Answer:   Hildred Laser [[id number]]      Return if symptoms worsen or fail to improve.  Cheyna Retana B. Malajah Oceguera, PA-C 05/05/2023 4:53 PM

## 2023-06-02 ENCOUNTER — Ambulatory Visit
Admission: EM | Admit: 2023-06-02 | Discharge: 2023-06-02 | Disposition: A | Discharge status: Home / Self Care | Payer: Managed Care, Other (non HMO) | Attending: Emergency Medicine | Admitting: Emergency Medicine

## 2023-06-02 DIAGNOSIS — Z113 Encounter for screening for infections with a predominantly sexual mode of transmission: Secondary | ICD-10-CM | POA: Insufficient documentation

## 2023-06-02 NOTE — ED Provider Notes (Signed)
[facility]    CSN: [id number] Arrival date & time: 06/02/23  1012      History   Chief Complaint Chief Complaint  Patient presents with   SEXUALLY TRANSMITTED DISEASE    HPI Samantha Ramsey is a 27 y.o. female.   Patient presents for evaluation for routine STI testing.  Sexually active, 1 partner, no known exposure but recently found out about partner's infidelity.  Denies all symptoms at this time.  Past Medical History:  Diagnosis Date   Chlamydia 07/2018   treated   Gonorrhea    12/15/2018, 09/2018.  Treated   History of COVID-19    HSV-1 (herpes simplex virus 1) infection    Serology postiive only. Has never had outbreak   Wears contact lenses    Yeast vaginitis     Patient Active Problem List   Diagnosis Date Noted   Iliotibial band syndrome of left side 07/03/2021   History of miscarriage 06/27/2020   LGSIL on Pap smear of cervix 12/08/2019   Human herpes simplex virus type 1 (HSV-1) DNA detected 12/03/2019   Yeast infection of the vagina 03/17/2018    Past Surgical History:  Procedure Laterality Date   DILATION AND EVACUATION N/A 04/13/2020   Procedure: DILATATION AND EVACUATION;  Surgeon: Hildred Laser, MD;  Location: [facility];  Service: Gynecology;  Laterality: N/A;   MINOR EXCISION EAR CANAL MASS Left 11/01/2021   Procedure: EXCISION LEFT EAR CANAL MASS;  Surgeon: Vernie Murders, MD;  Location: [facility];  Service: ENT;  Laterality: Left;   WISDOM TOOTH EXTRACTION  10/2016   all four;     OB History     Gravida  2   Para  1   Term  1   Preterm  0   AB  1   Living  1      SAB  1   IAB  0   Ectopic  0   Multiple  0   Live Births  1            Home Medications    Prior to Admission medications   Medication Sig Start Date End Date Taking? Authorizing Provider  levonorgestrel (KYLEENA) 19.5 MG IUD 1 each by Intrauterine route once. Inserted 07/10/2021   Yes [provider]    Family  History Family History  Problem Relation Age of Onset   Hypertension Mother    Healthy Father    Cervical cancer Maternal Grandmother    Ovarian cancer Maternal Grandmother    Breast cancer Neg Hx    Colon cancer Neg Hx     Social History Social History   Tobacco Use   Smoking status: Never   Smokeless tobacco: Never  Vaping Use   Vaping status: Never Used  Substance Use Topics   Alcohol use: Not Currently    Alcohol/week: 1.0 standard drink of alcohol    Types: 1 Standard drinks or equivalent per week   Drug use: No     Allergies   Flagyl [metronidazole]   Review of Systems Review of Systems  Constitutional: Negative.   Genitourinary: Negative.   Skin: Negative.      Physical Exam Triage Vital Signs ED Triage Vitals  Encounter Vitals Group     BP 06/02/23 1020 129/85     Systolic BP Percentile --      Diastolic BP Percentile --      Pulse Rate 06/02/23 1020 94     Resp 06/02/23 1020 18  Temp 06/02/23 1020 98.7 F (37.1 C)     Temp Source 06/02/23 1020 Oral     SpO2 06/02/23 1020 99 %     Weight 06/02/23 1018 160 lb (72.6 kg)     Height 06/02/23 1018 4\' 11"  (1.499 m)     Head Circumference --      Peak Flow --      Pain Score 06/02/23 1018 0     Pain Loc --      Pain Education --      Exclude from Growth Chart --    No data found.  Updated Vital Signs BP 129/85 (BP Location: Left Arm)   Pulse 94   Temp 98.7 F (37.1 C) (Oral)   Resp 18   Ht 4\' 11"  (1.499 m)   Wt 160 lb (72.6 kg)   LMP 05/18/2023 (Exact Date)   SpO2 99%   BMI 32.32 kg/m   Visual Acuity Right Eye Distance:   Left Eye Distance:   Bilateral Distance:    Right Eye Near:   Left Eye Near:    Bilateral Near:     Physical Exam Constitutional:      Appearance: Normal appearance.  Eyes:     Extraocular Movements: Extraocular movements intact.  Pulmonary:     Effort: Pulmonary effort is normal.  Genitourinary:    Comments: deferred Neurological:     Mental Status:  She is alert and oriented to person, place, and time. Mental status is at baseline.      UC Treatments / Results  Labs (all labs ordered are listed, but only abnormal results are displayed) Labs Reviewed  RPR  HIV ANTIBODY (ROUTINE TESTING W REFLEX)  CERVICOVAGINAL ANCILLARY ONLY    EKG   Radiology No results found.  Procedures Procedures (including critical care time)  Medications Ordered in UC Medications - No data to display  Initial Impression / Assessment and Plan / UC Course  I have reviewed the triage vital signs and the nursing notes.  Pertinent labs & imaging results that were available during my care of the patient were reviewed by me and considered in my medical decision making (see chart for details).  Routine screening for STI   STI labs pending will treat per protocol, advised abstinence until lab results, and/or treatment is complete, advised condom use during all sexual encounters moving, may follow-up with urgent care as needed  Final Clinical Impressions(s) / UC Diagnoses   Final diagnoses:  Routine screening for STI (sexually transmitted infection)     Discharge Instructions      Labs pending , you will be contacted if positive for any sti and treatment will be sent to the pharmacy, you will have to return to the clinic if positive for gonorrhea to receive treatment   Please refrain from having sex until labs results, if positive please refrain from having sex until treatment complete and symptoms resolve   If positive for HIV, Syphilis, Chlamydia  gonorrhea or trichomoniasis please notify partner or partners so they may tested as well  Moving forward, it is recommended you use some form of protection against the transmission of sti infections  such as condoms or dental dams with each sexual encounter     ED Prescriptions   None    PDMP not reviewed this encounter.   Valinda Hoar, [place] 06/02/23 [phone]

## 2023-06-02 NOTE — ED Triage Notes (Signed)
"  I just need to get STI testing". Possible exposure to STI. No symptoms.

## 2023-06-02 NOTE — Discharge Instructions (Signed)
Labs pending ,you will be contacted if positive for any sti and treatment will be sent to the pharmacy, you will have to return to the clinic if positive for gonorrhea to receive treatment   Please refrain from having sex until labs results, if positive please refrain from having sex until treatment complete and symptoms resolve   If positive for HIV, Syphilis, Chlamydia  gonorrhea or trichomoniasis please notify partner or partners so they may tested as well  Moving forward, it is recommended you use some form of protection against the transmission of sti infections  such as condoms or dental dams with each sexual encounter   

## 2023-06-03 LAB — RPR: RPR Ser Ql: NONREACTIVE

## 2023-06-03 LAB — CERVICOVAGINAL ANCILLARY ONLY
Bacterial Vaginitis (gardnerella): POSITIVE — AB
Candida Glabrata: NEGATIVE
Candida Vaginitis: NEGATIVE
Chlamydia: NEGATIVE
Comment: NEGATIVE
Comment: NEGATIVE
Comment: NEGATIVE
Comment: NEGATIVE
Comment: NEGATIVE
Comment: NORMAL
Neisseria Gonorrhea: NEGATIVE
Trichomonas: NEGATIVE

## 2023-06-03 LAB — HIV ANTIBODY (ROUTINE TESTING W REFLEX): HIV Screen 4th Generation wRfx: NONREACTIVE

## 2023-06-04 ENCOUNTER — Telehealth (HOSPITAL_COMMUNITY): Payer: Self-pay | Admitting: Emergency Medicine

## 2023-06-04 MED ORDER — METRONIDAZOLE 0.75 % VA GEL: 1.0000 | Freq: Every day | VAGINAL | 0 refills | Status: AC

## 2023-06-09 ENCOUNTER — Encounter: Payer: Self-pay | Admitting: Obstetrics and Gynecology

## 2023-06-09 MED ORDER — FLUCONAZOLE 150 MG PO TABS: 150.0000 mg | ORAL_TABLET | Freq: Once | ORAL | 0 refills | Status: AC

## 2023-06-10 ENCOUNTER — Other Ambulatory Visit: Payer: Self-pay

## 2023-06-10 ENCOUNTER — Emergency Department
Admission: EM | Admit: 2023-06-10 | Discharge: 2023-06-10 | Disposition: A | Discharge status: Home / Self Care | Payer: Managed Care, Other (non HMO) | Attending: Emergency Medicine | Admitting: Emergency Medicine

## 2023-06-10 DIAGNOSIS — R197 Diarrhea, unspecified: Secondary | ICD-10-CM | POA: Diagnosis not present

## 2023-06-10 DIAGNOSIS — R252 Cramp and spasm: Secondary | ICD-10-CM | POA: Diagnosis not present

## 2023-06-10 DIAGNOSIS — E876 Hypokalemia: Secondary | ICD-10-CM | POA: Diagnosis not present

## 2023-06-10 LAB — BASIC METABOLIC PANEL
Anion gap: 8 (ref 5–15)
BUN: 13 mg/dL (ref 6–20)
CO2: 20 mmol/L — ABNORMAL LOW (ref 22–32)
Calcium: 8.8 mg/dL — ABNORMAL LOW (ref 8.9–10.3)
Chloride: 104 mmol/L (ref 98–111)
Creatinine, Ser: 0.67 mg/dL (ref 0.44–1.00)
GFR, Estimated: >60 mL/min (ref >60)
Glucose, Bld: 119 mg/dL — ABNORMAL HIGH (ref 70–99)
Potassium: 3.2 mmol/L — ABNORMAL LOW (ref 3.5–5.1)
Sodium: 132 mmol/L — ABNORMAL LOW (ref 135–145)

## 2023-06-10 LAB — CBC
HCT: 39.1 % (ref 36.0–46.0)
Hemoglobin: 13.7 g/dL (ref 12.0–15.0)
MCH: 30.1 pg (ref 26.0–34.0)
MCHC: 35 g/dL (ref 30.0–36.0)
MCV: 85.9 fL (ref 80.0–100.0)
Platelets: 205 10*3/uL (ref 150–400)
RBC: 4.55 MIL/uL (ref 3.87–5.11)
RDW: 11.9 % (ref 11.5–15.5)
WBC: 13.6 10*3/uL — ABNORMAL HIGH (ref 4.0–10.5)
nRBC: 0 % (ref 0.0–0.2)

## 2023-06-10 LAB — MAGNESIUM: Magnesium: 1.3 mg/dL — ABNORMAL LOW (ref 1.7–2.4)

## 2023-06-10 LAB — POC URINE PREG, ED: Preg Test, Ur: NEGATIVE

## 2023-06-10 MED ORDER — SODIUM CHLORIDE 0.9 % IV BOLUS
1000.0000 mL | Freq: Once | INTRAVENOUS | Status: AC
  Administered 2023-06-10: 1000 mL via INTRAVENOUS

## 2023-06-10 MED ORDER — CALCIUM CARBONATE 1250 (500 CA) MG PO TABS
1.0000 | ORAL_TABLET | Freq: Once | ORAL | Status: AC
  Administered 2023-06-10: 1250 mg via ORAL
  Filled 2023-06-10: qty 1

## 2023-06-10 MED ORDER — MAGNESIUM SULFATE 2 GM/50ML IV SOLN
2.0000 g | Freq: Once | INTRAVENOUS | Status: AC
  Administered 2023-06-10: 2 g via INTRAVENOUS
  Filled 2023-06-10: qty 50

## 2023-06-10 MED ORDER — POTASSIUM CHLORIDE CRYS ER 20 MEQ PO TBCR
40.0000 meq | EXTENDED_RELEASE_TABLET | Freq: Once | ORAL | Status: AC
  Administered 2023-06-10: 40 meq via ORAL
  Filled 2023-06-10: qty 2

## 2023-06-10 NOTE — ED Provider Notes (Signed)
[facility] Provider Note    Event Date/Time   First MD Initiated Contact with Patient 06/10/23 1449     (approximate)   History   Spasms   HPI  Samantha Ramsey is a 27 y.o. female   Past medical history of no significant past medical history presents to the emergency department with spasms in her hands and legs.  This developed today in the setting of a diarrheal illness.  Watery stools profuse this morning without bleeding.  No significant abdominal pain, no nausea or vomiting.  She mostly feels some tingling in her hands and feet and has some spasms which makes her fingers extend and occasional thigh cramping.  She reports no other acute medical complaints.  She does not take any dietary supplements, diuretics, and has no restrictive eating habits.  She has no known sick contacts.  She denies fever.  Denies urinary symptoms.  Independent Historian contributed to assessment above: Cousin is at bedside to corroborate information given above    Physical Exam   Triage Vital Signs: ED Triage Vitals  Encounter Vitals Group     BP 06/10/23 1343 106/67     Systolic BP Percentile --      Diastolic BP Percentile --      Pulse Rate 06/10/23 1343 100     Resp 06/10/23 1342 20     Temp 06/10/23 1343 98.4 F (36.9 C)     Temp src --      SpO2 06/10/23 1343 98 %     Weight 06/10/23 1342 160 lb (72.6 kg)     Height 06/10/23 1342 4\' 11"  (1.499 m)     Head Circumference --      Peak Flow --      Pain Score 06/10/23 1342 10     Pain Loc --      Pain Education --      Exclude from Growth Chart --     Most recent vital signs: Vitals:   06/10/23 1342 06/10/23 1343  BP:  106/67  Pulse:  100  Resp: 20   Temp:  98.4 F (36.9 C)  SpO2:  98%    General: Awake, no distress.  CV:  Good peripheral perfusion.  Resp:  Normal effort.  Abd:  No distention.  Other:  Alert comfortable appearing no acute distress.  She does have some contractures of bilateral  hands but is able to make a fist and move with full active range of motion of the wrist and hands.  She is able to move the rest of her extremities and ambulate with steady gait.  Soft nontender abdomen appears euvolemic.   ED Results / Procedures / Treatments   Labs (all labs ordered are listed, but only abnormal results are displayed) Labs Reviewed  CBC - Abnormal; Notable for the following components:      Result Value   WBC 13.6 (*)    All other components within normal limits  BASIC METABOLIC PANEL - Abnormal; Notable for the following components:   Sodium 132 (*)    Potassium 3.2 (*)    CO2 20 (*)    Glucose, Bld 119 (*)    Calcium 8.8 (*)    All other components within normal limits  MAGNESIUM - Abnormal; Notable for the following components:   Magnesium 1.3 (*)    All other components within normal limits  POC URINE PREG, ED     I ordered and reviewed the above labs they  are notable for mild low potassium, magnesium, calcium    PROCEDURES:  Critical Care performed: No  Procedures   MEDICATIONS ORDERED IN ED: Medications  magnesium sulfate IVPB 2 g 50 mL (2 g Intravenous New Bag/Given 06/10/23 1606)  sodium chloride 0.9 % bolus 1,000 mL (1,000 mLs Intravenous New Bag/Given 06/10/23 1600)  potassium chloride SA (KLOR-CON M) CR tablet 40 mEq (40 mEq Oral Given 06/10/23 1603)  calcium carbonate (OS-CAL - dosed in mg of elemental calcium) tablet 1,250 mg (1,250 mg Oral Given 06/10/23 1603)     IMPRESSION / MDM / ASSESSMENT AND PLAN / ED COURSE  I reviewed the triage vital signs and the nursing notes.                                Patient's presentation is most consistent with acute presentation with potential threat to life or bodily function.  Differential diagnosis includes, but is not limited to, electrolyte disturbance, AKI, dehydration, intra-abdominal infection like appendicitis or cholecystitis   The patient is on the cardiac monitor to evaluate for  evidence of arrhythmia and/or significant heart rate changes.  MDM:    I think that her muscle cramping is in the setting of dehydration, mild, and electrolyte disturbances multifactorial including magnesium, potassium, calcium which are all mildly low.  She otherwise appears well and has a benign abdominal exam so I doubt intra-abdominal surgical pathologies.  She was ordered for IV normal saline, electrolytes including magnesium, calcium, potassium and midway through this treatment course feels better in her hands are no longer contracted.  Will continue with treatment as above, discharge, have her continue electrolyte supplementation and follow-up with PMD.       FINAL CLINICAL IMPRESSION(S) / ED DIAGNOSES   Final diagnoses:  Diarrhea, unspecified type  Hypomagnesemia  Hypokalemia  Hypocalcemia  Muscle cramping     Rx / DC Orders   ED Discharge Orders     None        Note:  This document was prepared using Dragon voice recognition software and may include unintentional dictation errors.    Pilar Jarvis, MD 06/10/23 [phone]

## 2023-06-10 NOTE — Discharge Instructions (Addendum)
Drink plenty of fluids to stay well-hydrated.  Find Pedialyte or similar electrolyte rehydration formulas at your local pharmacy.  Thank you for choosing [place] for your health care today!  Please see your primary doctor this week for a follow up appointment.   If you have any new, worsening, or unexpected symptoms call your doctor right away or come back to the emergency department for reevaluation.  It was my pleasure to care for you today.   Daneil Dan [place] Charon, MD

## 2023-06-10 NOTE — ED Triage Notes (Signed)
Pt to ED for muscle cramping to bilateral hands and legs x3 hours.

## 2023-06-13 NOTE — Progress Notes (Unsigned)
Name: Samantha Ramsey   MRN: [id number]    DOB: 26-Feb-1996   Date:06/16/2023       Progress Note  Subjective  Chief Complaint  ER Follow-Up  HPI  She had one episode of watery stools without blood 6 days ago followed by a few episodes of vomiting, followed by tingling of hands, feet and legs. Hands were stiff and went to [facility] 06/10/2023  found to have hypokalemia, hypomagnesemia and low sodium and calcium. She was given IV fluids , symptoms resolved and was sent home . Symptoms resolved about 24 hours later, she came in today for a follow up but is feeling .   White count was high and we will also check that   Patient Active Problem List   Diagnosis Date Noted   Iliotibial band syndrome of left side 07/03/2021   History of miscarriage 06/27/2020   LGSIL on Pap smear of cervix 12/08/2019   Human herpes simplex virus type 1 (HSV-1) DNA detected 12/03/2019   Yeast infection of the vagina 03/17/2018    Past Surgical History:  Procedure Laterality Date   DILATION AND EVACUATION N/A 04/13/2020   Procedure: DILATATION AND EVACUATION;  Surgeon: Hildred Laser, MD;  Location: [facility];  Service: Gynecology;  Laterality: N/A;   MINOR EXCISION EAR CANAL MASS Left 11/01/2021   Procedure: EXCISION LEFT EAR CANAL MASS;  Surgeon: Vernie Murders, MD;  Location: [facility];  Service: ENT;  Laterality: Left;   WISDOM TOOTH EXTRACTION  10/2016   all four;     Family History  Problem Relation Age of Onset   Hypertension Mother    Healthy Father    Cervical cancer Maternal Grandmother    Ovarian cancer Maternal Grandmother    Breast cancer Neg Hx    Colon cancer Neg Hx     Social History   Tobacco Use   Smoking status: Never   Smokeless tobacco: Never  Substance Use Topics   Alcohol use: Not Currently    Alcohol/week: 1.0 standard drink of alcohol    Types: 1 Standard drinks or equivalent per week     Current Outpatient Medications:    levonorgestrel (KYLEENA) 19.5 MG IUD, 1 each  by Intrauterine route once. Inserted 07/10/2021, Disp: , Rfl:   Allergies  Allergen Reactions   Flagyl [Metronidazole] Nausea And Vomiting    I personally reviewed active problem list, medication list, allergies, family history, social history, health maintenance with the patient/caregiver today.   ROS  Ten systems reviewed and is negative except as mentioned in HPI    Objective  Vitals:   06/16/23 1122  BP: 116/72  Pulse: 100  Resp: 16  SpO2: 98%  Weight: 164 lb (74.4 kg)  Height: 4\' 11"  (1.499 m)    Body mass index is 33.12 kg/m.  Physical Exam  Constitutional: Patient appears well-developed and well-nourished. Obese  No distress.  HEENT: head atraumatic, normocephalic, pupils equal and reactive to light, neck supple, throat within normal limitslar: Normal rate, regular rhythm and normal heart sounds.  No murmur heard. No BLE edema. Pulmonary/Chest: Effort normal and breath sounds normal. No respiratory distress. Abdominal: Soft.  There is no tenderness. Neuro: normal reflexes  Psychiatric: Patient has a normal mood and affect. behavior is normal. Judgment and thought content normal.    PHQ2/9:    06/16/2023   11:22 AM 02/04/2023    3:52 PM 07/12/2022    2:58 PM 03/19/2022    3:40 PM 02/06/2022    8:45 AM  Depression screen PHQ 2/9  Decreased Interest 0 0 0 0 1  Down, Depressed, Hopeless 0 0 0 0 0  PHQ - 2 Score 0 0 0 0 1  Altered sleeping 0 0 0 0 1  Tired, decreased energy 0 0 0 0 1  Change in appetite 0 0 0 0 1  Feeling bad or failure about yourself  0 0 0 0 0  Trouble concentrating 0 0 0 0 1  Moving slowly or fidgety/restless 0 0 0 0 0  Suicidal thoughts 0 0 0 0 0  PHQ-9 Score 0 0 0 0 5  Difficult doing work/chores   Not difficult at all Not difficult at all Somewhat difficult    phq 9 is negative   Fall Risk:    06/16/2023   11:21 AM 02/04/2023    3:52 PM 07/12/2022    2:58 PM 03/19/2022    3:40 PM 02/06/2022    8:42 AM  Fall Risk   Falls in the past  year? 0 0 0 0 0  Number falls in past yr: 0 0 0 0 0  Injury with Fall? 0 0 0 0 0  Risk for fall due to : No Fall Risks No Fall Risks     Follow up Falls prevention discussed Falls prevention discussed         Functional Status Survey: Is the patient deaf or have difficulty hearing?: No Does the patient have difficulty seeing, even when wearing glasses/contacts?: No Does the patient have difficulty concentrating, remembering, or making decisions?: No Does the patient have difficulty walking or climbing stairs?: No Does the patient have difficulty dressing or bathing?: No Does the patient have difficulty doing errands alone such as visiting a doctor's office or shopping?: No    Assessment & Plan  1. Low magnesium level  - Magnesium  2. Hypokalemia  - BASIC METABOLIC PANEL WITH GFR  3. Other elevated white blood cell (WBC) count  - CBC with Differential/Platelet  4. Diarrhea, unspecified type  Resolved

## 2023-06-16 ENCOUNTER — Ambulatory Visit (INDEPENDENT_AMBULATORY_CARE_PROVIDER_SITE_OTHER): Payer: Managed Care, Other (non HMO) | Admitting: Family Medicine

## 2023-06-16 ENCOUNTER — Encounter: Payer: Self-pay | Admitting: Family Medicine

## 2023-06-16 VITALS — BP 116/72 | HR 100 | Resp 16 | Ht 59.0 in | Wt 164.0 lb

## 2023-06-16 DIAGNOSIS — R197 Diarrhea, unspecified: Secondary | ICD-10-CM

## 2023-06-16 DIAGNOSIS — R790 Abnormal level of blood mineral: Secondary | ICD-10-CM | POA: Diagnosis not present

## 2023-06-16 DIAGNOSIS — E876 Hypokalemia: Secondary | ICD-10-CM

## 2023-06-16 DIAGNOSIS — D72828 Other elevated white blood cell count: Secondary | ICD-10-CM

## 2023-06-16 LAB — CBC WITH DIFFERENTIAL/PLATELET
Absolute Monocytes: 360 cells/uL (ref 200–950)
Basophils Absolute: 27 cells/uL (ref 0–200)
Basophils Relative: 0.4 %
Eosinophils Absolute: 82 cells/uL (ref 15–500)
Eosinophils Relative: 1.2 %
HCT: 38.7 % (ref 35.0–45.0)
Hemoglobin: 13 g/dL (ref 11.7–15.5)
Lymphs Abs: 2863 cells/uL (ref 850–3900)
MCH: 29.8 pg (ref 27.0–33.0)
MCHC: 33.6 g/dL (ref 32.0–36.0)
MCV: 88.8 fL (ref 80.0–100.0)
MPV: 9.9 fL (ref 7.5–12.5)
Monocytes Relative: 5.3 %
Neutro Abs: 3468 cells/uL (ref 1500–7800)
Neutrophils Relative %: 51 %
Platelets: 255 10*3/uL (ref 140–400)
RBC: 4.36 10*6/uL (ref 3.80–5.10)
RDW: 12.3 % (ref 11.0–15.0)
Total Lymphocyte: 42.1 %
WBC: 6.8 10*3/uL (ref 3.8–10.8)

## 2023-06-18 NOTE — Progress Notes (Unsigned)
Name: Samantha Ramsey   MRN: [id number]    DOB: 1996/07/14   Date:06/19/2023       Progress Note  Subjective  Chief Complaint  Annual exam  HPI  Patient presents for annual CPE.  Diet: she is starting to cook more at home and cutting down on eating out  Exercise: needs to increase to 150 minutes per week   Last Eye Exam: up to date  Last Dental Exam: up to date   [employer] Visit from 06/19/2023 in [facility]  AUDIT-C Score 1      Depression: Phq 9 is  negative    06/19/2023   11:57 AM 06/16/2023   11:22 AM 02/04/2023    3:52 PM 07/12/2022    2:58 PM 03/19/2022    3:40 PM  Depression screen PHQ 2/9  Decreased Interest 0 0 0 0 0  Down, Depressed, Hopeless 0 0 0 0 0  PHQ - 2 Score 0 0 0 0 0  Altered sleeping 1 0 0 0 0  Tired, decreased energy 1 0 0 0 0  Change in appetite 1 0 0 0 0  Feeling bad or failure about yourself  0 0 0 0 0  Trouble concentrating 1 0 0 0 0  Moving slowly or fidgety/restless 0 0 0 0 0  Suicidal thoughts 0 0 0 0 0  PHQ-9 Score 4 0 0 0 0  Difficult doing work/chores Not difficult at all   Not difficult at all Not difficult at all   Hypertension: BP Readings from Last 3 Encounters:  06/19/23 116/76  06/16/23 116/72  06/10/23 106/67   Obesity: Wt Readings from Last 3 Encounters:  06/19/23 158 lb 9.6 oz (71.9 kg)  06/16/23 164 lb (74.4 kg)  06/10/23 160 lb (72.6 kg)   BMI Readings from Last 3 Encounters:  06/19/23 32.03 kg/m  06/16/23 33.12 kg/m  06/10/23 32.32 kg/m     Vaccines:   HPV: not interested  Tdap: 02/22/2021 Shingrix: N/A Pneumonia: N/A Flu: recommend it yearly  COVID-19:N/A   Hep C Screening: up to date STD testing and prevention (HIV/chl/gon/syphilis): up to date  Intimate partner violence: negative screen  Sexual History : one sexual partner for the past 3 years, no condoms - father of her son  Menstrual History/LMP/Abnormal Bleeding: she has Kylena IUD since August 22 but still  spots for 2 days every 3 weeks  Discussed importance of follow up if any post-menopausal bleeding: not applicable  Incontinence Symptoms: N/A for symptoms   Breast cancer:  - Last Mammogram: N/A - BRCA gene screening: N/A  Osteoporosis Prevention : Discussed high calcium and vitamin D supplementation, weight bearing exercises Bone density :not applicable   Cervical cancer screening: up to date - normal pap smear 12/2021   Skin cancer: Discussed monitoring for atypical lesions  Colorectal cancer: N/A   Lung cancer:  Low Dose CT Chest recommended if Age 19-80 years, 20 pack-year currently smoking OR have quit w/in 15years. Patient does not qualify for screen   ECG: 03/02/2017  Advanced Care Planning: A voluntary discussion about advance care planning including the explanation and discussion of advance directives.  Discussed health care proxy and Living will, and the patient was able to identify a health care proxy as parents .  Patient does not have a living will and power of attorney of health care   Lipids: Lab Results  Component Value Date   CHOL 193 02/18/2023   CHOL 216 (H)  01/09/2022   CHOL 212 (H) 12/01/2019   Lab Results  Component Value Date   HDL 63 02/18/2023   HDL 65 01/09/2022   HDL 67 12/01/2019   Lab Results  Component Value Date   LDLCALC 116 (H) 02/18/2023   LDLCALC 120 (H) 01/09/2022   LDLCALC 133 (H) 12/01/2019   Lab Results  Component Value Date   TRIG 77 02/18/2023   TRIG 180 (H) 01/09/2022   TRIG 67 12/01/2019   Lab Results  Component Value Date   CHOLHDL 3.1 02/18/2023   CHOLHDL 3.3 01/09/2022   CHOLHDL 3.2 12/01/2019   No results found for: "LDLDIRECT"  Glucose: Glucose  Date Value Ref Range Status  02/18/2023 89 70 - 99 mg/dL Final  86/57/8469 77 70 - 99 mg/dL Final  62/95/2841 91 65 - 99 mg/dL Final   Glucose, Bld  Date Value Ref Range Status  06/16/2023 88 65 - 99 mg/dL Final    Comment:    .            Fasting reference  interval .   06/10/2023 119 (H) 70 - 99 mg/dL Final    Comment:    Glucose reference range applies only to samples taken after fasting for at least 8 hours.  12/15/2018 88 65 - 139 mg/dL Final    Comment:    .        Non-fasting reference interval .     Patient Active Problem List   Diagnosis Date Noted   Iliotibial band syndrome of left side 07/03/2021   History of miscarriage 06/27/2020   LGSIL on Pap smear of cervix 12/08/2019   Human herpes simplex virus type 1 (HSV-1) DNA detected 12/03/2019   Yeast infection of the vagina 03/17/2018    Past Surgical History:  Procedure Laterality Date   DILATION AND EVACUATION N/A 04/13/2020   Procedure: DILATATION AND EVACUATION;  Surgeon: Hildred Laser, MD;  Location: [facility] OR[facility];  Service: Gynecology;  Laterality: N/A;   MINOR EXCISION EAR CANAL MASS Left 11/01/2021   Procedure: EXCISION LEFT EAR CANAL MASS;  Surgeon: Vernie Murders, MD;  Location: [facility];  Service: ENT;  Laterality: Left;   WISDOM TOOTH EXTRACTION  10/2016   all four;     Family History  Problem Relation Age of Onset   Hypertension Mother    Healthy Father    Cervical cancer Maternal Grandmother    Ovarian cancer Maternal Grandmother    Breast cancer Neg Hx    Colon cancer Neg Hx     Social History   Socioeconomic History   Marital status: Single    Spouse name: Not on file   Number of children: 0   Years of education: Not on file   Highest education level: Some college, no degree  Occupational History   Occupation: full time Consulting civil engineer    Occupation: works part time     Comment: Architect as a host   Tobacco Use   Smoking status: Never   Smokeless tobacco: Never  Vaping Use   Vaping status: Never Used  Substance and Sexual Activity   Alcohol use: Not Currently    Alcohol/week: 1.0 standard drink of alcohol    Types: 1 Standard drinks or equivalent per week   Drug use: No   Sexual activity: Yes    Partners: Male    Birth  control/protection: I.U.D.    Comment: Kyleena  Other Topics Concern   Not on file  Social History Narrative   She  is going [employer] and is getting a degree in Water quality scientist    Social Determinants of Health   Financial Resource Strain: Low Risk  (06/19/2023)   Overall Financial Resource Strain (CARDIA)    Difficulty of Paying Living Expenses: Not hard at all  Food Insecurity: No Food Insecurity (06/19/2023)   Hunger Vital Sign    Worried About Running Out of Food in the Last Year: Never true    Ran Out of Food in the Last Year: Never true  Transportation Needs: No Transportation Needs (06/19/2023)   PRAPARE - Administrator, Civil Service (Medical): No    Lack of Transportation (Non-Medical): No  Physical Activity: Insufficiently Active (06/19/2023)   Exercise Vital Sign    Days of Exercise per Week: 1 day    Minutes of Exercise per Session: 20 min  Stress: No Stress Concern Present (06/19/2023)   [employer] of Occupational Health - Occupational Stress Questionnaire    Feeling of Stress : Only a little  Social Connections: Moderately Isolated (06/19/2023)   Social Connection and Isolation Panel [NHANES]    Frequency of Communication with Friends and Family: More than three times a week    Frequency of Social Gatherings with Friends and Family: More than three times a week    Attends Religious Services: More than 4 times per year    Active Member of [employer] or Organizations: No    Attends Banker Meetings: Never    Marital Status: Never married  Intimate Partner Violence: Not At Risk (06/19/2023)   Humiliation, Afraid, Rape, and Kick questionnaire    Fear of Current or Ex-Partner: No    Emotionally Abused: No    Physically Abused: No    Sexually Abused: No     Current Outpatient Medications:    levonorgestrel (KYLEENA) 19.5 MG IUD, 1 each by Intrauterine route once. Inserted 07/10/2021, Disp: , Rfl:   Allergies  Allergen Reactions   Flagyl  [Metronidazole] Nausea And Vomiting     ROS  Constitutional: Negative for fever or weight change.  Respiratory: Negative for cough and shortness of breath.   Cardiovascular: Negative for chest pain or palpitations.  Gastrointestinal: Negative for abdominal pain, no bowel changes.  Musculoskeletal: Negative for gait problem or joint swelling.  Skin: Negative for rash.  Neurological: Negative for dizziness or headache.  No other specific complaints in a complete review of systems (except as listed in HPI above).   Objective  Vitals:   06/19/23 1155  BP: 116/76  Pulse: 87  Resp: 14  Temp: 97.8 F (36.6 C)  TempSrc: Oral  SpO2: 96%  Weight: 158 lb 9.6 oz (71.9 kg)  Height: 4\' 11"  (1.499 m)    Body mass index is 32.03 kg/m.  Physical Exam  Constitutional: Patient appears well-developed and well-nourished. No distress.  HENT: Head: Normocephalic and atraumatic. Ears: B TMs ok, no erythema or effusion; Nose: Nose normal. Mouth/Throat: Oropharynx is clear and moist. No oropharyngeal exudate.  Eyes: Conjunctivae and EOM are normal. Pupils are equal, round, and reactive to light. No scleral icterus.  Neck: Normal range of motion. Neck supple. No JVD present. No thyromegaly present.  Cardiovascular: Normal rate, regular rhythm and normal heart sounds.  No murmur heard. No BLE edema. Pulmonary/Chest: Effort normal and breath sounds normal. No respiratory distress. Abdominal: Soft. Bowel sounds are normal, no distension. There is no tenderness. no masses Breast: no lumps or masses, no nipple discharge or rashes FEMALE GENITALIA:  Not done  RECTAL: not done  Musculoskeletal: Normal range of motion, no joint effusions. No gross deformities Neurological: he is alert and oriented to person, place, and time. No cranial nerve deficit. Coordination, balance, strength, speech and gait are normal.  Skin: Skin is warm and dry. No rash noted. No erythema.  Psychiatric: Patient has a normal  mood and affect. behavior is normal. Judgment and thought content normal.   Recent Results (from the past 2160 hour(s))  POCT Wet Prep with KOH     Status: Normal   Collection Time: 05/05/23  4:52 PM  Result Value Ref Range   Trichomonas, UA Negative    Clue Cells Wet Prep HPF POC neg    Epithelial Wet Prep HPF POC     Yeast Wet Prep HPF POC neg    Bacteria Wet Prep HPF POC     RBC Wet Prep HPF POC     WBC Wet Prep HPF POC     KOH Prep POC Negative Negative  Cervicovaginal ancillary only     Status: Abnormal   Collection Time: 06/02/23 10:26 AM  Result Value Ref Range   Neisseria Gonorrhea Negative    Chlamydia Negative    Trichomonas Negative    Bacterial Vaginitis (gardnerella) Positive (A)    Candida Vaginitis Negative    Candida Glabrata Negative    Comment      Normal Reference Range Bacterial Vaginosis - Negative   Comment Normal Reference Range Candida Species - Negative    Comment Normal Reference Range Candida Galbrata - Negative    Comment Normal Reference Range Trichomonas - Negative    Comment Normal Reference Ranger Chlamydia - Negative    Comment      Normal Reference Range Neisseria Gonorrhea - Negative  RPR     Status: None   Collection Time: 06/02/23 10:32 AM  Result Value Ref Range   RPR Ser Ql Non Reactive Non Reactive  HIV Antibody (routine testing w rflx)     Status: None   Collection Time: 06/02/23 10:32 AM  Result Value Ref Range   HIV Screen 4th Generation wRfx Non Reactive Non Reactive    Comment: HIV-1/HIV-2 antibodies and HIV-1 p24 antigen were NOT detected. There is no laboratory evidence of HIV infection. HIV Negative   CBC     Status: Abnormal   Collection Time: 06/10/23  1:44 PM  Result Value Ref Range   WBC 13.6 (H) 4.0 - 10.5 K/uL   RBC 4.55 3.87 - 5.11 MIL/uL   Hemoglobin 13.7 12.0 - 15.0 g/dL   HCT 47.4 25.9 - 56.3 %   MCV 85.9 80.0 - 100.0 fL   MCH 30.1 26.0 - 34.0 pg   MCHC 35.0 30.0 - 36.0 g/dL   RDW 87.5 64.3 - 32.9 %    Platelets 205 150 - 400 K/uL   nRBC 0.0 0.0 - 0.2 %    Comment: Performed at [facility], [address], [place], [place] [address]  Basic metabolic panel     Status: Abnormal   Collection Time: 06/10/23  1:44 PM  Result Value Ref Range   Sodium 132 (L) 135 - 145 mmol/L   Potassium 3.2 (L) 3.5 - 5.1 mmol/L   Chloride 104 98 - 111 mmol/L   CO2 20 (L) 22 - 32 mmol/L   Glucose, Bld 119 (H) 70 - 99 mg/dL    Comment: Glucose reference range applies only to samples taken after fasting for at least 8 hours.   BUN 13 6 -  20 mg/dL   Creatinine, Ser 4.09 0.44 - 1.00 mg/dL   Calcium 8.8 (L) 8.9 - 10.3 mg/dL   GFR, Estimated >81 >19 mL/min    Comment: (NOTE) Calculated using the CKD-EPI Creatinine Equation (2021)    Anion gap 8 5 - 15    Comment: Performed at [facility], [address] [place], [place] [address]  Magnesium     Status: Abnormal   Collection Time: 06/10/23  1:44 PM  Result Value Ref Range   Magnesium 1.3 (L) 1.7 - 2.4 mg/dL    Comment: Performed at [facility], [address] [place], [place] [address]  POC urine preg, ED     Status: None   Collection Time: 06/10/23  4:07 PM  Result Value Ref Range   Preg Test, Ur Negative Negative  BASIC METABOLIC PANEL WITH GFR     Status: None   Collection Time: 06/16/23 12:02 PM  Result Value Ref Range   Glucose, Bld 88 65 - 99 mg/dL    Comment: .            Fasting reference interval .    BUN 16 7 - 25 mg/dL   Creat 3.08 6.57 - 8.46 mg/dL   eGFR 962 > OR = 60 XB/MWU/1.32G4   BUN/Creatinine Ratio SEE NOTE: 6 - 22 (calc)    Comment:    Not Reported: BUN and Creatinine are within    reference range. .    Sodium 140 135 - 146 mmol/L   Potassium 3.9 3.5 - 5.3 mmol/L   Chloride 105 98 - 110 mmol/L   CO2 27 20 - 32 mmol/L   Calcium 9.0 8.6 - 10.2 mg/dL  Magnesium     Status: None   Collection Time: 06/16/23 12:02 PM  Result Value Ref Range   Magnesium 1.9 1.5 - 2.5 mg/dL  CBC with  Differential/Platelet     Status: None   Collection Time: 06/16/23 12:02 PM  Result Value Ref Range   WBC 6.8 3.8 - 10.8 Thousand/uL   RBC 4.36 3.80 - 5.10 Million/uL   Hemoglobin 13.0 11.7 - 15.5 g/dL   HCT 01.0 27.2 - 53.6 %   MCV 88.8 80.0 - 100.0 fL   MCH 29.8 27.0 - 33.0 pg   MCHC 33.6 32.0 - 36.0 g/dL   RDW 64.4 03.4 - 74.2 %   Platelets 255 140 - 400 Thousand/uL   MPV 9.9 7.5 - 12.5 fL   Neutro Abs 3,468 1,500 - 7,800 cells/uL   Lymphs Abs 2,863 850 - 3,900 cells/uL   Absolute Monocytes 360 200 - 950 cells/uL   Eosinophils Absolute 82 15 - 500 cells/uL   Basophils Absolute 27 0 - 200 cells/uL   Neutrophils Relative % 51 %   Total Lymphocyte 42.1 %   Monocytes Relative 5.3 %   Eosinophils Relative 1.2 %   Basophils Relative 0.4 %     Fall Risk:    06/19/2023   11:57 AM 06/16/2023   11:21 AM 02/04/2023    3:52 PM 07/12/2022    2:58 PM 03/19/2022    3:40 PM  Fall Risk   Falls in the past year? 0 0 0 0 0  Number falls in past yr:  0 0 0 0  Injury with Fall?  0 0 0 0  Risk for fall due to : No Fall Risks No Fall Risks No Fall Risks    Follow up Falls prevention discussed;Education provided;Falls evaluation completed Falls prevention discussed  Falls prevention discussed       Functional Status Survey: Is the patient deaf or have difficulty hearing?: No Does the patient have difficulty seeing, even when wearing glasses/contacts?: No Does the patient have difficulty concentrating, remembering, or making decisions?: No Does the patient have difficulty walking or climbing stairs?: No Does the patient have difficulty dressing or bathing?: No Does the patient have difficulty doing errands alone such as visiting a doctor's office or shopping?: No   Assessment & Plan  1. Well woman exam    -USPSTF grade A and B recommendations reviewed with patient; age-appropriate recommendations, preventive care, screening tests, etc discussed and encouraged; healthy living encouraged;  see AVS for patient education given to patient -Discussed importance of 150 minutes of physical activity weekly, eat two servings of fish weekly, eat one serving of tree nuts ( cashews, pistachios, pecans, almonds.Marland Kitchen) every other day, eat 6 servings of fruit/vegetables daily and drink plenty of water and avoid sweet beverages.   -Reviewed Health Maintenance: Yes.

## 2023-06-19 ENCOUNTER — Ambulatory Visit (INDEPENDENT_AMBULATORY_CARE_PROVIDER_SITE_OTHER): Payer: Managed Care, Other (non HMO) | Admitting: Family Medicine

## 2023-06-19 ENCOUNTER — Encounter: Payer: Self-pay | Admitting: Family Medicine

## 2023-06-19 VITALS — BP 116/76 | HR 87 | Temp 97.8°F | Resp 14 | Ht 59.0 in | Wt 158.6 lb

## 2023-06-19 DIAGNOSIS — Z01419 Encounter for gynecological examination (general) (routine) without abnormal findings: Secondary | ICD-10-CM

## 2023-07-16 ENCOUNTER — Encounter: Payer: Self-pay | Admitting: Obstetrics and Gynecology

## 2023-07-16 ENCOUNTER — Ambulatory Visit: Payer: Managed Care, Other (non HMO) | Admitting: Obstetrics and Gynecology

## 2023-07-16 VITALS — BP 116/78 | HR 76 | Resp 16 | Ht 59.0 in | Wt 164.5 lb

## 2023-07-16 DIAGNOSIS — N6322 Unspecified lump in the left breast, upper inner quadrant: Secondary | ICD-10-CM

## 2023-07-16 NOTE — Progress Notes (Signed)
    GYNECOLOGY PROGRESS NOTE  Subjective:    Patient ID: Samantha Ramsey, female    DOB: [birth year], 27 y.o.   MRN: [id number]  HPI  Patient is a 27 y.o. G66P1011 female who presents for evaluation of breast lump. She reports a hard knot in her left breast., has been present for ~ 1 month She said the knot is painful, she denies redness or swelling.  Denies any personal or family history of breast cyst or breast cancer.  Currently utilizing [facility] IUD for contraception.  The following portions of the patient's history were reviewed and updated as appropriate: allergies, current medications, past family history, past medical history, past social history, past surgical history, and problem list.  Review of Systems Pertinent items noted in HPI and remainder of comprehensive ROS otherwise negative.   Objective:  Blood pressure 116/78, pulse 76, resp. rate 16, height 4\' 11"  (1.499 m), weight 164 lb 8 oz (74.6 kg).  Body mass index is 33.22 kg/m. General appearance: alert, cooperative, and no distress Breasts: right breast normal without mass, skin or nipple changes or axillary nodes. left breast normal skin, no nipple changes or axillary nodes, palpable mobile mass noted in mid outer quadrant however majority noted more in upper quadrant approximately 3 cm from the areola, 2 to 3 cm in size. Neurologic: Grossly normal   Assessment:   1. Mass of upper inner quadrant of left breast      Plan:   Discussed likely benign nature of breast mass, most likely fibroadenoma or enlarged duct cyst.  Will order breast ultrasound for further evaluation.  Advised on ice packs and NSAIDs for comfort measures, also encouraged to discontinue caffeine at this time if she consumes in large amounts.  To follow-up if symptoms worsen or fail to improve, as long as ultrasound results are not concerning.    Hildred Laser, MD [facility]

## 2023-07-23 ENCOUNTER — Ambulatory Visit
Admission: RE | Admit: 2023-07-23 | Discharge: 2023-07-23 | Disposition: A | Discharge status: Home / Self Care | Payer: Managed Care, Other (non HMO) | Source: Ambulatory Visit | Attending: Obstetrics and Gynecology | Admitting: Obstetrics and Gynecology

## 2023-07-23 DIAGNOSIS — N6321 Unspecified lump in the left breast, upper outer quadrant: Secondary | ICD-10-CM | POA: Diagnosis not present

## 2023-07-23 DIAGNOSIS — N6322 Unspecified lump in the left breast, upper inner quadrant: Secondary | ICD-10-CM | POA: Insufficient documentation

## 2023-07-23 DIAGNOSIS — N644 Mastodynia: Secondary | ICD-10-CM | POA: Diagnosis not present

## 2023-08-29 ENCOUNTER — Encounter: Payer: Self-pay | Admitting: Emergency Medicine

## 2023-08-29 ENCOUNTER — Ambulatory Visit
Admission: EM | Admit: 2023-08-29 | Discharge: 2023-08-29 | Disposition: A | Discharge status: Home / Self Care | Payer: Managed Care, Other (non HMO) | Attending: Emergency Medicine | Admitting: Emergency Medicine

## 2023-08-29 DIAGNOSIS — B9689 Other specified bacterial agents as the cause of diseases classified elsewhere: Secondary | ICD-10-CM | POA: Diagnosis not present

## 2023-08-29 DIAGNOSIS — N760 Acute vaginitis: Secondary | ICD-10-CM | POA: Insufficient documentation

## 2023-08-29 DIAGNOSIS — B379 Candidiasis, unspecified: Secondary | ICD-10-CM | POA: Diagnosis not present

## 2023-08-29 LAB — URINALYSIS, W/ REFLEX TO CULTURE (INFECTION SUSPECTED)
Bilirubin Urine: NEGATIVE
Glucose, UA: NEGATIVE mg/dL
Ketones, ur: NEGATIVE mg/dL
Leukocytes,Ua: NEGATIVE
Nitrite: NEGATIVE
Protein, ur: NEGATIVE mg/dL
Specific Gravity, Urine: 1.025 (ref 1.005–1.030)
pH: 5.5 (ref 5.0–8.0)

## 2023-08-29 LAB — WET PREP, GENITAL
Sperm: NONE SEEN
Trich, Wet Prep: NONE SEEN
WBC, Wet Prep HPF POC: <10 (ref <10)

## 2023-08-29 MED ORDER — METRONIDAZOLE 0.75 % VA GEL: 1.0000 | Freq: Every day | VAGINAL | 0 refills | Status: AC

## 2023-08-29 MED ORDER — FLUCONAZOLE 150 MG PO TABS: 150.0000 mg | ORAL_TABLET | Freq: Every day | ORAL | 0 refills | Status: AC

## 2023-08-29 NOTE — ED Triage Notes (Signed)
Patient reports vaginal itching and dysuria that started a week ago.  Patient denies any ABD or back pain.  Patient denies vaginal discharge.  Patient wants to have STD test.

## 2023-08-29 NOTE — Discharge Instructions (Signed)
Today you are evaluated for vaginal itching and burning with urination  Urinalysis is negative for infection  Vaginal wet prep is positive for yeast and bacterial vaginosis, negative for trichomoniasis  Bacterial vaginosis and yeast are infections that occur when the vaginal pH is altered for various reasons, these are not sexually transmitted however semen can alter the pH  Begin use of MetroGel every night before bed for 7 days for treatment of bacterial vaginosis  Take 1 Diflucan tablet when you receive medicine then after completion of 7-day course of MetroGel you may take second dose of Diflucan for treatment of yeast  Gonorrhea and Chlamydia is pending 2 to 3 days,  you will be notified of positive test results only, medicine will be sent in at time of notification, you will need to return to clinic for treatment if positive for gonorrhea as it is an injection  If positive for gonorrhea chlamydia please notify your partner as these are sexually transmitted infections and they will need testing and treatment as well before resuming intercourse  Please refrain from having any form of sexual intercourse until all treatment is complete, labs have resulted and symptoms have resolved  You may follow-up with his urgent care as needed for any further concerns  In addition: Avoid baths, hot tubs and whirlpool spas.  Don't use scented or harsh soaps Avoid irritants. These include scented tampons and pads. Wipe from front to back after using the toilet. Don't douche. Your vagina doesn't require cleansing other than normal bathing.  Use a condom.  Wear cotton underwear, this fabric absorbs some moisture.

## 2023-08-29 NOTE — ED Provider Notes (Signed)
[facility]    CSN: [id number] Arrival date & time: 08/29/23  1220      History   Chief Complaint Chief Complaint  Patient presents with   Vaginal Discharge   Dysuria    HPI Samantha Ramsey is a 27 y.o. female.   Patient presents for evaluation of vaginal itching and dysuria present for 7 days.  Sexually active, 1 partner, no known exposure.  Has not attempted treatment.  Denies vaginal discharge, vaginal odor, abdominal or flank pain, fever, hematuria, urinary frequency or urgency.  Requesting STD testing.  Has IUD, currently menstruating.  Past Medical History:  Diagnosis Date   Chlamydia 07/2018   treated   Gonorrhea    12/15/2018, 09/2018.  Treated   History of COVID-19    HSV-1 (herpes simplex virus 1) infection    Serology postiive only. Has never had outbreak   Wears contact lenses    Yeast vaginitis     Patient Active Problem List   Diagnosis Date Noted   Iliotibial band syndrome of left side 07/03/2021   History of miscarriage 06/27/2020   LGSIL on Pap smear of cervix 12/08/2019   Human herpes simplex virus type 1 (HSV-1) DNA detected 12/03/2019   Yeast infection of the vagina 03/17/2018    Past Surgical History:  Procedure Laterality Date   DILATION AND EVACUATION N/A 04/13/2020   Procedure: DILATATION AND EVACUATION;  Surgeon: Hildred Laser, MD;  Location: [facility];  Service: Gynecology;  Laterality: N/A;   MINOR EXCISION EAR CANAL MASS Left 11/01/2021   Procedure: EXCISION LEFT EAR CANAL MASS;  Surgeon: Vernie Murders, MD;  [facility]: [facility];  Service: ENT;  Laterality: Left;   WISDOM TOOTH EXTRACTION  10/2016   all four;     OB History     Gravida  2   Para  1   Term  1   Preterm  0   AB  1   Living  1      SAB  1   IAB  0   Ectopic  0   Multiple  0   Live Births  1            Home Medications    Prior to Admission medications   Medication Sig Start Date End Date Taking? Authorizing Provider   levonorgestrel (KYLEENA) 19.5 MG IUD 1 each by Intrauterine route once. Inserted 07/10/2021    [provider]    Family History Family History  Problem Relation Age of Onset   Hypertension Mother    Healthy Father    Cervical cancer Maternal Grandmother    Ovarian cancer Maternal Grandmother    Breast cancer Neg Hx    Colon cancer Neg Hx     Social History Social History   Tobacco Use   Smoking status: Never   Smokeless tobacco: Never  Vaping Use   Vaping status: Never Used  Substance Use Topics   Alcohol use: Not Currently    Alcohol/week: 1.0 standard drink of alcohol    Types: 1 Standard drinks or equivalent per week   Drug use: No     Allergies   Flagyl [metronidazole]   Review of Systems Review of Systems  Constitutional: Negative.   HENT: Negative.    Respiratory: Negative.    Cardiovascular: Negative.   Genitourinary:  Positive for dysuria. Negative for decreased urine volume, difficulty urinating, dyspareunia, enuresis, flank pain, frequency, genital sores, hematuria, menstrual problem, pelvic pain, urgency, vaginal bleeding, vaginal discharge  and vaginal pain.     Physical Exam Triage Vital Signs ED Triage Vitals  Encounter Vitals Group     BP 08/29/23 1233 114/78     Systolic BP Percentile --      Diastolic BP Percentile --      Pulse Rate 08/29/23 1233 77     Resp 08/29/23 1233 14     Temp 08/29/23 1233 98.2 F (36.8 C)     Temp Source 08/29/23 1233 Oral     SpO2 08/29/23 1233 98 %     Weight 08/29/23 1231 162 lb (73.5 kg)     Height 08/29/23 1231 4\' 11"  (1.499 m)     Head Circumference --      Peak Flow --      Pain Score 08/29/23 1231 0     Pain Loc --      Pain Education --      Exclude from Growth Chart --    No data found.  Updated Vital Signs BP 114/78 (BP Location: Left Arm)   Pulse 77   Temp 98.2 F (36.8 C) (Oral)   Resp 14   Ht 4\' 11"  (1.499 m)   Wt 162 lb (73.5 kg)   SpO2 98%   BMI 32.72 kg/m   Visual  Acuity Right Eye Distance:   Left Eye Distance:   Bilateral Distance:    Right Eye Near:   Left Eye Near:    Bilateral Near:     Physical Exam Constitutional:      Appearance: Normal appearance.  Eyes:     Extraocular Movements: Extraocular movements intact.  Pulmonary:     Effort: Pulmonary effort is normal.  Genitourinary:    Comments: deferred Neurological:     Mental Status: She is alert and oriented to person, place, and time. Mental status is at baseline.      UC Treatments / Results  Labs (all labs ordered are listed, but only abnormal results are displayed) Labs Reviewed  WET PREP, GENITAL - Abnormal; Notable for the following components:      Result Value   Yeast Wet Prep HPF POC PRESENT (*)    Clue Cells Wet Prep HPF POC PRESENT (*)    All other components within normal limits  URINALYSIS, W/ REFLEX TO CULTURE (INFECTION SUSPECTED) - Abnormal; Notable for the following components:   Hgb urine dipstick TRACE (*)    Bacteria, UA FEW (*)    All other components within normal limits  CERVICOVAGINAL ANCILLARY ONLY    EKG   Radiology No results found.  Procedures Procedures (including critical care time)  Medications Ordered in UC Medications - No data to display  Initial Impression / Assessment and Plan / UC Course  I have reviewed the triage vital signs and the nursing notes.  Pertinent labs & imaging results that were available during my care of the patient were reviewed by me and considered in my medical decision making (see chart for details).  Yeast infection, bacterial vaginosis  Urinalysis negative, wet prep positive for yeast and bacterial vaginosis, negative for trichomoniasis, gonorrhea and chlamydia pending, discussed all findings with patient, MetroGel and Diflucan sent to pharmacy and discussed administration, will treat further per protocol ,advised abstinence during treatment until lab results and until all symptoms have resolved,  discussed and given written handout of additional supportive measures, advised follow-up if symptoms continue to persist or worsen Final Clinical Impressions(s) / UC Diagnoses   Final diagnoses:  None   Discharge Instructions  None    ED Prescriptions   None    PDMP not reviewed this encounter.   Valinda Hoar, NP 08/29/23 1309

## 2023-09-01 LAB — CERVICOVAGINAL ANCILLARY ONLY
Chlamydia: NEGATIVE
Comment: NEGATIVE
Comment: NORMAL
Neisseria Gonorrhea: NEGATIVE

## 2023-12-16 ENCOUNTER — Other Ambulatory Visit: Payer: Self-pay | Admitting: Family Medicine

## 2023-12-16 ENCOUNTER — Telehealth: Payer: Self-pay

## 2023-12-16 DIAGNOSIS — K648 Other hemorrhoids: Secondary | ICD-10-CM

## 2023-12-16 DIAGNOSIS — K649 Unspecified hemorrhoids: Secondary | ICD-10-CM

## 2023-12-16 NOTE — Addendum Note (Signed)
Addended by: Davene Costain on: 12/16/2023 02:51 PM   Modules accepted: Orders

## 2023-12-16 NOTE — Telephone Encounter (Signed)
Was seen 3/24

## 2023-12-16 NOTE — Telephone Encounter (Signed)
Copied from CRM [id number]. Topic: Referral - Request for Referral >> Dec 16, 2023  2:28 PM Franchot Heidelberg wrote: Reason for CRM: Pt called requesting to for a referral to see Dr. Allegra Lai for GI. Pt has hemorrhoids, insurance does not require a referall however Dr. Verdis Prime office prefers one. Pt says she has spoken with her PCP about this. Pt does not want an appt to see PCP

## 2023-12-16 NOTE — Telephone Encounter (Signed)
The patient mother called in and left voicemail wanting to schedule appointment with Dr. Allegra Lai. She also wanted to know if she is taking new patients. I called the patient back to let him know we received his message, and I inform her she is taking new patients, but we will need a referral for her to schedule appointment.

## 2023-12-31 ENCOUNTER — Ambulatory Visit (INDEPENDENT_AMBULATORY_CARE_PROVIDER_SITE_OTHER): Payer: Federal, State, Local not specified - PPO | Admitting: Family Medicine

## 2023-12-31 ENCOUNTER — Encounter: Payer: Self-pay | Admitting: Family Medicine

## 2023-12-31 VITALS — BP 116/74 | HR 97 | Resp 16 | Ht 59.0 in | Wt 153.6 lb

## 2023-12-31 DIAGNOSIS — R202 Paresthesia of skin: Secondary | ICD-10-CM

## 2023-12-31 DIAGNOSIS — M542 Cervicalgia: Secondary | ICD-10-CM | POA: Diagnosis not present

## 2023-12-31 MED ORDER — NAPROXEN 500 MG PO TABS
500.0000 mg | ORAL_TABLET | Freq: Two times a day (BID) | ORAL | 0 refills | Status: DC
Start: 2023-12-31 — End: 2024-03-03

## 2023-12-31 MED ORDER — BACLOFEN 10 MG PO TABS
10.0000 mg | ORAL_TABLET | Freq: Three times a day (TID) | ORAL | 0 refills | Status: DC
Start: 2023-12-31 — End: 2024-03-03

## 2023-12-31 NOTE — Progress Notes (Signed)
Name: Samantha Ramsey   MRN: [id number]    DOB: April 26, 1996   Date:12/31/2023       Progress Note  Subjective  Chief Complaint  Chief Complaint  Patient presents with   Back Pain   Shoulder Pain    Left shoulder radiating towards back into the middle, on/off for months but has been consistent since last week    Discussed the use of AI scribe software for clinical note transcription with the patient, who gave verbal consent to proceed.  History of Present Illness   Samantha Ramsey is a 28 year old female who presents with neck  pain.  She has been experiencing sharp, constant neck pain that radiates to left shoulder,  rated as six out of ten in severity. The pain persists during rest and sleep, often waking her up. She denies any trauma, falls, or accidents that could have caused the pain and has not tried any medications such as Tylenol, Aleve, or Motrin.  She also experiences tingling and numbness in her hands, which occurs randomly and causes her hands to 'fall asleep'. This sensation is not constant and has been present for about a night or two. Occasionally, the tingling extends down her leg, causing numbness in her feet. She denies any specific activities or gym workouts that could have contributed to her symptoms. No chest pain or palpitations.        Patient Active Problem List   Diagnosis Date Noted   Iliotibial band syndrome of left side 07/03/2021   History of miscarriage 06/27/2020   LGSIL on Pap smear of cervix 12/08/2019   Human herpes simplex virus type 1 (HSV-1) DNA detected 12/03/2019   Yeast infection of the vagina 03/17/2018    Social History   Tobacco Use   Smoking status: Never   Smokeless tobacco: Never  Substance Use Topics   Alcohol use: Not Currently    Alcohol/week: 1.0 standard drink of alcohol    Types: 1 Standard drinks or equivalent per week     Current Outpatient Medications:    levonorgestrel (KYLEENA) 19.5 MG IUD, 1 each by Intrauterine route  once. Inserted 07/10/2021, Disp: , Rfl:   Allergies  Allergen Reactions   Flagyl [Metronidazole] Nausea And Vomiting    ROS  Ten systems reviewed and is negative except as mentioned in HPI    Objective  Vitals:   12/31/23 1553  BP: 116/74  Pulse: 97  Resp: 16  SpO2: 99%  Weight: 153 lb 9.6 oz (69.7 kg)  Height: 4\' 11"  (1.499 m)    Body mass index is 31.02 kg/m.    Physical Exam  Constitutional: Patient appears well-developed and well-nourished.  No distress.  HEENT: head atraumatic, normocephalic, pupils equal and reactive to light, neck supple Cardiovascular: Normal rate, regular rhythm and normal heart sounds.  No murmur heard. No BLE edema. Pulmonary/Chest: Effort normal and breath sounds normal. No respiratory distress. Abdominal: Soft.  There is no tenderness. Muscular skeletal: tense trapezium muscle on left side, pain during palpation of posterior left neck, normal shoulder exam and grip, normal sensation on arms and legs Psychiatric: Patient has a normal mood and affect. behavior is normal. Judgment and thought content normal.   Assessment and Plan    Neck pain  Chronic, constant, sharp pain from shoulder to back, worse on waking, with no known trauma or alleviating factors. Physical exam suggests muscular origin. -Start Naproxen twice daily with food. -Start muscle relaxer at night due to sedating effects. -Consider massage  therapy. -Return in 1 week if no improvement.  Tingling in Hands Intermittent tingling in both hands, possibly related to back pain. Also reports occasional numbness in legs and feet. - See if resolves on its own, otherwise return to discuss it further -Return for further evaluation if symptoms persist after back pain resolves.

## 2024-01-07 ENCOUNTER — Ambulatory Visit: Payer: Self-pay | Admitting: Family Medicine

## 2024-01-07 NOTE — Telephone Encounter (Signed)
Pt notified will go to [facility]

## 2024-01-07 NOTE — Telephone Encounter (Signed)
  Chief Complaint: L shoulder pain Symptoms: pain Frequency: two weeks Pertinent Negatives: Patient denies numbness, tingling, injury Disposition: [] ED /[] Urgent Care (no appt availability in office) / [x] Appointment(In office/virtual)/ []  [facility]/ [] Home Care/ [] Refused Recommended Disposition /[] [facility]/ []  Follow-up with PCP Additional Notes: Patient calls reporting worsening L shoulder pain. Reports that she has been seen by PCP for this, but the prescibed medication is not helping control the pain. Per protocol, patient to be evaluated within 3 days. Patient scheduled with PCP for 1540 on 01/09/24. Care advice reviewed, patient verbalized understandingand denies further questions at this time. Alerting PCP for review.    Copied from CRM [id number]. Topic: Clinical - Red Word Triage >> Jan 07, 2024  9:37 AM Turkey B wrote: Pt has right shoulder pain Reason for Disposition  [1] MODERATE pain (e.g., interferes with normal activities) AND [2] present > 3 days  Answer Assessment - Initial Assessment Questions 1. ONSET: "When did the pain start?"     Two weeks, was seen last week and given medication which is not helping 2. LOCATION: "Where is the pain located?"     L shoulder, top back towards spine. 3. PAIN: "How bad is the pain?" (Scale 1-10; or mild, moderate, severe)   - MILD (1-3): doesn't interfere with normal activities   - MODERATE (4-7): interferes with normal activities (e.g., work or school) or awakens from sleep   - SEVERE (8-10): excruciating pain, unable to do any normal activities, unable to move arm at all due to pain     6/10 4. WORK OR EXERCISE: "Has there been any recent work or exercise that involved this part of the body?"     Denies 5. CAUSE: "What do you think is causing the shoulder pain?"     Denies 6. OTHER SYMPTOMS: "Do you have any other symptoms?" (e.g., neck pain, swelling, rash, fever, numbness, weakness)     Denies 7.  PREGNANCY: "Is there any chance you are pregnant?" "When was your last menstrual period?"     LMP:  Protocols used: Shoulder Pain-A-AH

## 2024-01-09 ENCOUNTER — Ambulatory Visit: Payer: Federal, State, Local not specified - PPO | Admitting: Family Medicine

## 2024-01-13 ENCOUNTER — Encounter: Payer: Self-pay | Admitting: Obstetrics and Gynecology

## 2024-01-13 ENCOUNTER — Encounter: Payer: Self-pay | Admitting: Gastroenterology

## 2024-01-13 ENCOUNTER — Ambulatory Visit (INDEPENDENT_AMBULATORY_CARE_PROVIDER_SITE_OTHER): Payer: Federal, State, Local not specified - PPO | Admitting: Gastroenterology

## 2024-01-13 VITALS — BP 119/81 | HR 89 | Temp 97.2°F | Ht 59.0 in | Wt 152.1 lb

## 2024-01-13 DIAGNOSIS — K644 Residual hemorrhoidal skin tags: Secondary | ICD-10-CM | POA: Diagnosis not present

## 2024-01-13 DIAGNOSIS — K5904 Chronic idiopathic constipation: Secondary | ICD-10-CM | POA: Diagnosis not present

## 2024-01-13 NOTE — Patient Instructions (Addendum)
 Take Miralax 1 cupful Daily with a large glass of water.  Take Benefiber,  Metamucil or citrucel.  High-Fiber Eating Plan Fiber, also called dietary fiber, is found in foods such as fruits, vegetables, whole grains, and beans. A high-fiber diet can be good for your health. Your health care provider may recommend a high-fiber diet to help: Prevent trouble pooping (constipation). Lower your cholesterol. Treat the following conditions: Hemorrhoids. This is inflammation of veins in the anus. Inflammation of specific areas of the digestive tract. Irritable bowel syndrome (IBS). This is a problem of the large intestine, also called the colon, that sometimes causes belly pain and bloating. Prevent overeating as part of a weight-loss plan. Lower the risk of heart disease, type 2 diabetes, and certain cancers. What are tips for following this plan? Reading food labels  Check the nutrition facts label on foods for the amount of dietary fiber. Choose foods that have 4 grams of fiber or more per serving. The recommended goals for how much fiber you should eat each day include: Males 48 years old or younger: 30-34 g. Males over 52 years old: 28-34 g. Females 65 years old or younger: 25-28 g. Females over 68 years old: 22-25 g. Your daily fiber goal is _____________ g. Shopping Choose whole fruits and vegetables instead of processed. For example, choose apples instead of apple juice or applesauce. Choose a variety of high-fiber foods such as avocados, lentils, oats, and pinto beans. Read the nutrition facts label on foods. Check for foods with added fiber. These foods often have high sugar and salt (sodium) amounts per serving. Cooking Use whole-grain flour for baking and cooking. Cook with brown rice instead of white rice. Make meals that have a lot of beans and vegetables in them, such as chili or vegetable-based soups. Meal planning Start the day with a breakfast that is high in fiber, such as a  cereal that has 5 g of fiber or more per serving. Eat breads and cereals that are made with whole-grain flour instead of refined flour or white flour. Eat brown rice, bulgur wheat, or millet instead of white rice. Use beans in place of meat in soups, salads, and pasta dishes. Be sure that half of the grains you eat each day are whole grains. General information You can get the recommended amount of dietary fiber by: Eating a variety of fruits, vegetables, grains, nuts, and beans. Taking a fiber supplement if you aren't able to eat enough fiber. It's better to get fiber through food than from a supplement. Slowly increase how much fiber you eat. If you increase the amount of fiber you eat too quickly, you may have bloating, cramping, or gas. Drink plenty of water to help you digest fiber. Choose high-fiber snacks, such as berries, raw vegetables, nuts, and popcorn. What foods should I eat? Fruits Berries. Pears. Apples. Oranges. Avocado. Prunes and raisins. Dried figs. Vegetables Sweet potatoes. Spinach. Kale. Artichokes. Cabbage. Broccoli. Cauliflower. Green peas. Carrots. Squash. Grains Whole-grain breads. Multigrain cereal. Oats and oatmeal. Brown rice. Barley. Bulgur wheat. Millet. Quinoa. Bran muffins. Popcorn. Rye wafer crackers. Meats and other proteins Navy beans, kidney beans, and pinto beans. Soybeans. Split peas. Lentils. Nuts and seeds. Dairy Fiber-fortified yogurt. Fortified means that fiber has been added to the product. Beverages Fiber-fortified soy milk. Fiber-fortified orange juice. Other foods Fiber bars. The items listed above may not be all the foods and drinks you can have. Talk to a dietitian to learn more. What foods should I avoid? Fruits  Fruit juice. Cooked, strained fruit. Vegetables Fried potatoes. Canned vegetables. Well-cooked vegetables. Grains White bread. Pasta made with refined flour. White rice. Meats and other proteins Fatty meat. Fried chicken or  fried fish. Dairy Milk. Cream cheese. Sour cream. Fats and oils Butters. Beverages Soft drinks. Other foods Cakes and pastries. The items listed above may not be all the foods and drinks you should avoid. Talk to a dietitian to learn more. This information is not intended to replace advice given to you by your health care provider. Make sure you discuss any questions you have with your health care provider. Document Revised: 01/27/2023 Document Reviewed: 01/27/2023 Elsevier Patient Education  2024 [employer].

## 2024-01-13 NOTE — Progress Notes (Signed)
 Arlyss Repress, MD [address]  [address]  [place], [place] [address]  Main: [phone]  Fax: [phone]    Gastroenterology Consultation  Referring Provider:     [name], MD Primary Care Physician:  [name], MD Primary Gastroenterologist:  Dr. Arlyss Repress Reason for Consultation: Chronic constipation, rectal pain        HPI:   Samantha Ramsey is a 28 y.o. female referred by Dr. [name], MD  for consultation & management of chronic constipation.  Patient reports that she has been suffering from irregular bowel movements for a long time, associated with straining, incomplete emptying.  She notices bright red blood per rectum on wiping after bowel movement along with rectal pain and itching.  When the GI referral was placed about a month ago, she had severe rectal pain with swelling that lasted for 2 weeks and she tried over-the-counter hemorrhoid cream.  Her symptoms improved since then.  She has noticed hemorrhoids after her pregnancy She does not smoke or drink alcohol She works at [employer] She denies any family history of GI malignancy  NSAIDs: None  Antiplts/Anticoagulants/Anti thrombotics: None  GI Procedures: None  Past Medical History:  Diagnosis Date   Chlamydia 07/2018   treated   Gonorrhea    12/15/2018, 09/2018.  Treated   History of COVID-19    HSV-1 (herpes simplex virus 1) infection    Serology postiive only. Has never had outbreak   Wears contact lenses    Yeast vaginitis     Past Surgical History:  Procedure Laterality Date   DILATION AND EVACUATION N/A 04/13/2020   Procedure: DILATATION AND EVACUATION;  Surgeon: Hildred Laser, MD;  Location: [facility];  Service: Gynecology;  Laterality: N/A;   MINOR EXCISION EAR CANAL MASS Left 11/01/2021   Procedure: EXCISION LEFT EAR CANAL MASS;  Surgeon: Vernie Murders, MD;  Location: [facility];  Service: ENT;  Laterality: Left;   WISDOM TOOTH EXTRACTION  10/2016   all  four;      Current Outpatient Medications:    baclofen (LIORESAL) 10 MG tablet, Take 1-2 tablets (10-20 mg total) by mouth 3 (three) times daily. It will cause sedation take at night  or when off work, Disp: 30 each, Rfl: 0   levonorgestrel (KYLEENA) 19.5 MG IUD, 1 each by Intrauterine route once. Inserted 07/10/2021, Disp: , Rfl:    naproxen (NAPROSYN) 500 MG tablet, Take 1 tablet (500 mg total) by mouth 2 (two) times daily with a meal., Disp: 30 tablet, Rfl: 0   Family History  Problem Relation Age of Onset   Hypertension Mother    Healthy Father    Cervical cancer Maternal Grandmother    Ovarian cancer Maternal Grandmother    Breast cancer Neg Hx    Colon cancer Neg Hx      Social History   Tobacco Use   Smoking status: Never   Smokeless tobacco: Never  Vaping Use   Vaping status: Never Used  Substance Use Topics   Alcohol use: Not Currently    Alcohol/week: 1.0 standard drink of alcohol    Types: 1 Standard drinks or equivalent per week   Drug use: No    Allergies as of 01/13/2024 - Review Complete 01/13/2024  Allergen Reaction Noted   Flagyl [metronidazole] Nausea And Vomiting 03/13/2020    Review of Systems:    All systems reviewed and negative except where noted in HPI.   Physical Exam:  BP 119/81 (BP  Location: Right Arm, Patient Position: Sitting, Cuff Size: Normal)   Pulse 89   Temp (!) 97.2 F (36.2 C) (Oral)   Ht 4\' 11"  (1.499 m)   Wt 152 lb 2 oz (69 kg)   BMI 30.73 kg/m  No LMP recorded. (Menstrual status: IUD).  General:   Alert,  Well-developed, well-nourished, pleasant and cooperative in NAD Head:  Normocephalic and atraumatic. Eyes:  Sclera clear, no icterus.   Conjunctiva pink. Ears:  Normal auditory acuity. Nose:  No deformity, discharge, or lesions. Mouth:  No deformity or lesions,oropharynx pink & moist. Neck:  Supple; no masses or thyromegaly. Lungs:  Respirations even and unlabored.  Clear throughout to auscultation.   No wheezes,  crackles, or rhonchi. No acute distress. Heart:  Regular rate and rhythm; no murmurs, clicks, rubs, or gallops. Abdomen:  Normal bowel sounds. Soft, non-tender and non-distended without masses, hepatosplenomegaly or hernias noted.  No guarding or rebound tenderness.   Rectal: 2 soft perianal skin tags, nontender digital rectal exam, hard stool was palpable in the rectal vault Msk:  Symmetrical without gross deformities. Good, equal movement & strength bilaterally. Pulses:  Normal pulses noted. Extremities:  No clubbing or edema.  No cyanosis. Neurologic:  Alert and oriented x3;  grossly normal neurologically. Skin:  Intact without significant lesions or rashes. No jaundice. Psych:  Alert and cooperative. Normal mood and affect.  Imaging Studies: No abdominal imaging  Assessment and Plan:   Samantha Ramsey is a 28 y.o. female with no significant past medical history seen in consultation for chronic constipation and symptomatic external hemorrhoids  Chronic idiopathic constipation Discussed about high-fiber diet, information provided Adequate intake of water Start MiraLAX 1 capful with large cup of water daily Fiber supplements as needed  Symptomatic external hemorrhoids, grade 1 Based on her history, she probably had thrombosed external hemorrhoid which was very painful Symptoms secondary to chronic constipation Discussed with her about managing constipation first and if her hemorrhoidal symptoms are still persistent, will proceed with outpatient hemorrhoid ligation    Follow up as needed, contact via MyChart in 4 weeks   Arlyss Repress, MD

## 2024-01-14 ENCOUNTER — Ambulatory Visit
Admission: RE | Admit: 2024-01-14 | Discharge: 2024-01-14 | Disposition: A | Discharge status: Home / Self Care | Payer: PRIVATE HEALTH INSURANCE | Source: Ambulatory Visit | Attending: Family Medicine | Admitting: Family Medicine

## 2024-01-14 VITALS — BP 111/79 | HR 91 | Temp 97.4°F | Resp 16

## 2024-01-14 DIAGNOSIS — Z113 Encounter for screening for infections with a predominantly sexual mode of transmission: Secondary | ICD-10-CM | POA: Diagnosis not present

## 2024-01-14 DIAGNOSIS — Z202 Contact with and (suspected) exposure to infections with a predominantly sexual mode of transmission: Secondary | ICD-10-CM | POA: Diagnosis not present

## 2024-01-14 DIAGNOSIS — N898 Other specified noninflammatory disorders of vagina: Secondary | ICD-10-CM | POA: Diagnosis not present

## 2024-01-14 DIAGNOSIS — J069 Acute upper respiratory infection, unspecified: Secondary | ICD-10-CM | POA: Diagnosis not present

## 2024-01-14 LAB — GROUP A STREP BY PCR: Group A Strep by PCR: NOT DETECTED

## 2024-01-14 NOTE — ED Triage Notes (Signed)
 Patient states that she wants std testing- just swab. Vaginal itching and burning. Since Monday  Sore throat started Monday

## 2024-01-14 NOTE — Discharge Instructions (Addendum)
 Your STD and vaginal yeast/BV test results will be available in the next 72 hours. If positive, someone will contact you.  You should see your results in your MyChart account.   Your strep test is negative. You have a viral respiratory infection that will gradually improve over the next 7-10 days. Cough may last up to 3 weeks.    You can take Tylenol and/or Ibuprofen as needed for fever reduction and pain relief.    For cough: honey 1/2 to 1 teaspoon (you can dilute the honey in water or another fluid).  You can also use guaifenesin and dextromethorphan for cough. You can use a humidifier for chest congestion and cough.  If you don't have a humidifier, you can sit in the bathroom with the hot shower running.      For sore throat: try warm salt water gargles, Mucinex sore throat cough drops or cepacol lozenges, throat spray, warm tea or water with lemon/honey, popsicles or ice, or OTC cold relief medicine for throat discomfort. You can also purchase chloraseptic spray at the pharmacy or dollar store.   For congestion: take a daily anti-histamine like Zyrtec, Claritin, and a oral decongestant, such as pseudoephedrine.  You can also use Flonase 1-2 sprays in each nostril daily. Afrin is also a good option, if you do not have high blood pressure.    It is important to stay hydrated: drink plenty of fluids (water, gatorade/powerade/pedialyte, juices, or teas) to keep your throat moisturized and help further relieve irritation/discomfort.    Return or go to the Emergency Department if symptoms worsen or do not improve in the next few days

## 2024-01-14 NOTE — ED Provider Notes (Signed)
 [facility]    CSN: [id number] Arrival date & time: 01/14/24  1520      History   Chief Complaint Chief Complaint  Patient presents with   SEXUALLY TRANSMITTED DISEASE    std testing - Entered by patient   Sore Throat    HPI Samantha Ramsey is a 28 y.o. female.   HPI  History obtained from the patient. Samantha Ramsey presents for sore throat, nasal congestion and cough that started on Monday.  No nasal congestion, vomiting, diarrhea or fever. Mucinex and cough drops.  No one else has been sick. No history of asthma. Denies vaping and smoking.   Had some vaginal itching, vaginal discharge and discomfort after urinating.  No treatments prior to arrival. No known STI exposure. She doesn't use condoms. LMP 1/231/25.  (Menstrual status: IUD).       Past Medical History:  Diagnosis Date   Chlamydia 07/2018   treated   Gonorrhea    12/15/2018, 09/2018.  Treated   History of COVID-19    HSV-1 (herpes simplex virus 1) infection    Serology postiive only. Has never had outbreak   Wears contact lenses    Yeast vaginitis     Patient Active Problem List   Diagnosis Date Noted   Iliotibial band syndrome of left side 07/03/2021   History of miscarriage 06/27/2020   LGSIL on Pap smear of cervix 12/08/2019   Human herpes simplex virus type 1 (HSV-1) DNA detected 12/03/2019   Yeast infection of the vagina 03/17/2018    Past Surgical History:  Procedure Laterality Date   DILATION AND EVACUATION N/A 04/13/2020   Procedure: DILATATION AND EVACUATION;  Surgeon: Hildred Laser, MD;  Location: [facility];  Service: Gynecology;  Laterality: N/A;   MINOR EXCISION EAR CANAL MASS Left 11/01/2021   Procedure: EXCISION LEFT EAR CANAL MASS;  Surgeon: Vernie Murders, MD;  Location: [facility];  Service: ENT;  Laterality: Left;   WISDOM TOOTH EXTRACTION  10/2016   all four;     OB History     Gravida  2   Para  1   Term  1   Preterm  0   AB  1   Living  1       SAB  1   IAB  0   Ectopic  0   Multiple  0   Live Births  1            Home Medications    Prior to Admission medications   Medication Sig Start Date End Date Taking? Authorizing Provider  levonorgestrel (KYLEENA) 19.5 MG IUD 1 each by Intrauterine route once. Inserted 07/10/2021   Yes [provider]  baclofen (LIORESAL) 10 MG tablet Take 1-2 tablets (10-20 mg total) by mouth 3 (three) times daily. It will cause sedation take at night  or when off work 12/31/23   [name], MD  naproxen (NAPROSYN) 500 MG tablet Take 1 tablet (500 mg total) by mouth 2 (two) times daily with a meal. 12/31/23   [name], MD    Family History Family History  Problem Relation Age of Onset   Hypertension Mother    Healthy Father    Cervical cancer Maternal Grandmother    Ovarian cancer Maternal Grandmother    Breast cancer Neg Hx    Colon cancer Neg Hx     Social History Social History   Tobacco Use   Smoking status: Never   Smokeless tobacco: Never  Vaping  Use   Vaping status: Never Used  Substance Use Topics   Alcohol use: Not Currently    Alcohol/week: 1.0 standard drink of alcohol    Types: 1 Standard drinks or equivalent per week   Drug use: No     Allergies   Flagyl [metronidazole]   Review of Systems Review of Systems: negative unless otherwise stated in HPI.      Physical Exam Triage Vital Signs ED Triage Vitals [01/14/24 1530]  Encounter Vitals Group     BP 111/79     Systolic BP Percentile      Diastolic BP Percentile      Pulse Rate 91     Resp 16     Temp (!) 97.4 F (36.3 C)     Temp Source Oral     SpO2 98 %     Weight      Height      Head Circumference      Peak Flow      Pain Score 0     Pain Loc      Pain Education      Exclude from Growth Chart    No data found.  Updated Vital Signs BP 111/79 (BP Location: Right Arm)   Pulse 91   Temp (!) 97.4 F (36.3 C) (Oral)   Resp 16   SpO2 98%   Visual Acuity Right Eye  Distance:   Left Eye Distance:   Bilateral Distance:    Right Eye Near:   Left Eye Near:    Bilateral Near:     Physical Exam GEN:     alert, non-toxic appearing female in no distress    HENT:  mucus membranes moist, oropharyngeal without lesions, mild erythema, 1+ tonsillar hypertrophy or exudates, clear nasal discharge, bilateral TM normal EYES:   no scleral injection or discharge RESP:  no increased work of breathing, clear to auscultation bilaterally CVS:   regular rate and rhythm GU:  deferred, pt performed a self swab  Skin:   warm and dry    UC Treatments / Results  Labs (all labs ordered are listed, but only abnormal results are displayed) Labs Reviewed  GROUP A STREP BY PCR  CERVICOVAGINAL ANCILLARY ONLY    EKG   Radiology No results found.  Procedures Procedures (including critical care time)  Medications Ordered in UC Medications - No data to display  Initial Impression / Assessment and Plan / UC Course  I have reviewed the triage vital signs and the nursing notes.  Pertinent labs & imaging results that were available during my care of the patient were reviewed by me and considered in my medical decision making (see chart for details).       Pt is a 28 y.o. female who presents for 2 days of respiratory symptoms. Samantha Ramsey is afebrile here without recent antipyretics. Satting well on room air. Overall pt is non-toxic appearing, well hydrated, without respiratory distress. Pulmonary exam is unremarkable. Strep PCR obtained and was negative. History most consistent with viral respiratory illness. Discussed symptomatic treatment.  Explained lack of efficacy of antibiotics in viral disease.  Typical duration of symptoms discussed.   Patient requests STD testing.  Obtained gonorrhea, chlamydia and trichomonas swab.  Advised her to use condoms with every sexual encounter.  Safe sex handout provided.  Return and ED precautions given and voiced understanding.  Discussed MDM, treatment plan and plan for follow-up with patient who agrees with plan.     Final Clinical Impressions(s) /  UC Diagnoses   Final diagnoses:  Vaginal itching  Possible exposure to STD  URI with cough and congestion     Discharge Instructions       Your STD and vaginal yeast/BV test results will be available in the next 72 hours. If positive, someone will contact you.  You should see your results in your MyChart account.   Your strep test is negative. You have a viral respiratory infection that will gradually improve over the next 7-10 days. Cough may last up to 3 weeks.    You can take Tylenol and/or Ibuprofen as needed for fever reduction and pain relief.    For cough: honey 1/2 to 1 teaspoon (you can dilute the honey in water or another fluid).  You can also use guaifenesin and dextromethorphan for cough. You can use a humidifier for chest congestion and cough.  If you don't have a humidifier, you can sit in the bathroom with the hot shower running.      For sore throat: try warm salt water gargles, Mucinex sore throat cough drops or cepacol lozenges, throat spray, warm tea or water with lemon/honey, popsicles or ice, or OTC cold relief medicine for throat discomfort. You can also purchase chloraseptic spray at the pharmacy or dollar store.   For congestion: take a daily anti-histamine like Zyrtec, Claritin, and a oral decongestant, such as pseudoephedrine.  You can also use Flonase 1-2 sprays in each nostril daily. Afrin is also a good option, if you do not have high blood pressure.    It is important to stay hydrated: drink plenty of fluids (water, gatorade/powerade/pedialyte, juices, or teas) to keep your throat moisturized and help further relieve irritation/discomfort.    Return or go to the Emergency Department if symptoms worsen or do not improve in the next few days      ED Prescriptions   None    PDMP not reviewed this encounter.   Katha Cabal,  DO 01/14/24 1628

## 2024-01-15 LAB — CERVICOVAGINAL ANCILLARY ONLY
Bacterial Vaginitis (gardnerella): NEGATIVE
Candida Glabrata: NEGATIVE
Candida Vaginitis: NEGATIVE
Chlamydia: NEGATIVE
Comment: NEGATIVE
Comment: NEGATIVE
Comment: NEGATIVE
Comment: NEGATIVE
Comment: NEGATIVE
Comment: NORMAL
Neisseria Gonorrhea: NEGATIVE
Trichomonas: NEGATIVE

## 2024-02-09 ENCOUNTER — Ambulatory Visit: Payer: Federal, State, Local not specified - PPO | Admitting: Gastroenterology

## 2024-02-23 ENCOUNTER — Ambulatory Visit: Payer: Federal, State, Local not specified - PPO | Admitting: Obstetrics and Gynecology

## 2024-03-02 NOTE — Progress Notes (Unsigned)
 GYNECOLOGY ANNUAL PHYSICAL EXAM PROGRESS NOTE  Subjective:    Samantha Ramsey is a 28 y.o. G73P1011 female who presents for an annual exam.  The patient {is/is not/has never been:13135} sexually active. The patient participates in regular exercise: {yes/no/not asked:9010}. Has the patient ever been transfused or tattooed?: {yes/no/not asked:9010}. The patient reports that there {is/is not:9024} domestic violence in her life.   The patient has the following complaints today:   Menstrual History: Menarche age: 89 No LMP recorded. (Menstrual status: IUD).     Gynecologic History:  Contraception: IUD History of STI's: Gonorrhea and Chlamydia Last Pap: 01/09/2022. Results were: normal. Denies h/o abnormal pap smears. Last mammogram: Not age appropriate      OB History  Gravida Para Term Preterm AB Living  2 1 1  0 1 1  SAB IAB Ectopic Multiple Live Births  1 0 0 0 1    # Outcome Date GA Lbr Len/2nd Weight Sex Type Anes PTL Lv  2 Term 05/15/21 [redacted]w[redacted]d 07:46 / 00:50 7 lb 2.6 oz (3.25 kg) M Vag-Spont EPI  LIV     Name: Samantha Ramsey     Apgar1: 8  Apgar5: 9  1 SAB 03/2020 [redacted]w[redacted]d       FD    Past Medical History:  Diagnosis Date   Chlamydia 07/2018   treated   Gonorrhea    12/15/2018, 09/2018.  Treated   History of COVID-19    HSV-1 (herpes simplex virus 1) infection    Serology postiive only. Has never had outbreak   Wears contact lenses    Yeast vaginitis     Past Surgical History:  Procedure Laterality Date   DILATION AND EVACUATION N/A 04/13/2020   Procedure: DILATATION AND EVACUATION;  Surgeon: Hildred Laser, MD;  Location: [facility];  Service: Gynecology;  Laterality: N/A;   MINOR EXCISION EAR CANAL MASS Left 11/01/2021   Procedure: EXCISION LEFT EAR CANAL MASS;  Surgeon: Vernie Murders, MD;  Location: [facility];  Service: ENT;  Laterality: Left;   WISDOM TOOTH EXTRACTION  10/2016   all four;     Family History  Problem Relation Age of Onset    Hypertension Mother    Healthy Father    Cervical cancer Maternal Grandmother    Ovarian cancer Maternal Grandmother    Breast cancer Neg Hx    Colon cancer Neg Hx     Social History   Socioeconomic History   Marital status: Single    Spouse name: Not on file   Number of children: 0   Years of education: Not on file   Highest education level: Some college, no degree  Occupational History   Occupation: full time Consulting civil engineer    Occupation: works part time     Comment: Architect as a host   Tobacco Use   Smoking status: Never   Smokeless tobacco: Never  Vaping Use   Vaping status: Never Used  Substance and Sexual Activity   Alcohol use: Not Currently    Alcohol/week: 1.0 standard drink of alcohol    Types: 1 Standard drinks or equivalent per week   Drug use: No   Sexual activity: Yes    Partners: Male    Birth control/protection: I.U.D.    Comment: Kyleena  Other Topics Concern   Not on file  Social History Narrative   She is going [employer] and is getting a degree in Water quality scientist    Social Drivers of Corporate investment banker Strain:  Low Risk  (06/19/2023)   Overall Financial Resource Strain (CARDIA)    Difficulty of Paying Living Expenses: Not hard at all  Food Insecurity: No Food Insecurity (06/19/2023)   Hunger Vital Sign    Worried About Running Out of Food in the Last Year: Never true    Ran Out of Food in the Last Year: Never true  Transportation Needs: No Transportation Needs (06/19/2023)   PRAPARE - Administrator, Civil Service (Medical): No    Lack of Transportation (Non-Medical): No  Physical Activity: Insufficiently Active (06/19/2023)   Exercise Vital Sign    Days of Exercise per Week: 1 day    Minutes of Exercise per Session: 20 min  Stress: No Stress Concern Present (06/19/2023)   [employer] of Occupational Health - Occupational Stress Questionnaire    Feeling of Stress : Only a little  Social Connections: Moderately Isolated  (06/19/2023)   Social Connection and Isolation Panel [NHANES]    Frequency of Communication with Friends and Family: More than three times a week    Frequency of Social Gatherings with Friends and Family: More than three times a week    Attends Religious Services: More than 4 times per year    Active Member of [employer] or Organizations: No    Attends Banker Meetings: Never    Marital Status: Never married  Intimate Partner Violence: Not At Risk (06/19/2023)   Humiliation, Afraid, Rape, and Kick questionnaire    Fear of Current or Ex-Partner: No    Emotionally Abused: No    Physically Abused: No    Sexually Abused: No    Current Outpatient Medications on File Prior to Visit  Medication Sig Dispense Refill   baclofen (LIORESAL) 10 MG tablet Take 1-2 tablets (10-20 mg total) by mouth 3 (three) times daily. It will cause sedation take at night  or when off work 30 each 0   levonorgestrel (KYLEENA) 19.5 MG IUD 1 each by Intrauterine route once. Inserted 07/10/2021     naproxen (NAPROSYN) 500 MG tablet Take 1 tablet (500 mg total) by mouth 2 (two) times daily with a meal. 30 tablet 0   No current facility-administered medications on file prior to visit.    Allergies  Allergen Reactions   Flagyl [Metronidazole] Nausea And Vomiting     Review of Systems Constitutional: negative for chills, fatigue, fevers and sweats Eyes: negative for irritation, redness and visual disturbance Ears, nose, mouth, throat, and face: negative for hearing loss, nasal congestion, snoring and tinnitus Respiratory: negative for asthma, cough, sputum Cardiovascular: negative for chest pain, dyspnea, exertional chest pressure/discomfort, irregular heart beat, palpitations and syncope Gastrointestinal: negative for abdominal pain, change in bowel habits, nausea and vomiting Genitourinary: negative for abnormal menstrual periods, genital lesions, sexual problems and vaginal discharge, dysuria and urinary  incontinence Integument/breast: negative for breast lump, breast tenderness and nipple discharge Hematologic/lymphatic: negative for bleeding and easy bruising Musculoskeletal:negative for back pain and muscle weakness Neurological: negative for dizziness, headaches, vertigo and weakness Endocrine: negative for diabetic symptoms including polydipsia, polyuria and skin dryness Allergic/Immunologic: negative for hay fever and urticaria      Objective:  Height 4\' 11"  (1.499 m). Body mass index is 30.73 kg/m.    General Appearance:    Alert, cooperative, no distress, appears stated age  Head:    Normocephalic, without obvious abnormality, atraumatic  Eyes:    PERRL, conjunctiva/corneas clear, EOM's intact, both eyes  Ears:    Normal external ear canals, both  ears  Nose:   Nares normal, septum midline, mucosa normal, no drainage or sinus tenderness  Throat:   Lips, mucosa, and tongue normal; teeth and gums normal  Neck:   Supple, symmetrical, trachea midline, no adenopathy; thyroid: no enlargement/tenderness/nodules; no carotid bruit or JVD  Back:     Symmetric, no curvature, ROM normal, no CVA tenderness  Lungs:     Clear to auscultation bilaterally, respirations unlabored  Chest Wall:    No tenderness or deformity   Heart:    Regular rate and rhythm, S1 and S2 normal, no murmur, rub or gallop  Breast Exam:    No tenderness, masses, or nipple abnormality  Abdomen:     Soft, non-tender, bowel sounds active all four quadrants, no masses, no organomegaly.    Genitalia:    Pelvic:external genitalia normal, vagina without lesions, discharge, or tenderness, rectovaginal septum  normal. Cervix normal in appearance, no cervical motion tenderness, no adnexal masses or tenderness.  Uterus normal size, shape, mobile, regular contours, nontender.  Rectal:    Normal external sphincter.  No hemorrhoids appreciated. Internal exam not done.   Extremities:   Extremities normal, atraumatic, no cyanosis or  edema  Pulses:   2+ and symmetric all extremities  Skin:   Skin color, texture, turgor normal, no rashes or lesions  Lymph nodes:   Cervical, supraclavicular, and axillary nodes normal  Neurologic:   CNII-XII intact, normal strength, sensation and reflexes throughout   .  Labs:  Lab Results  Component Value Date   WBC 6.8 06/16/2023   HGB 13.0 06/16/2023   HCT 38.7 06/16/2023   MCV 88.8 06/16/2023   PLT 255 06/16/2023    Lab Results  Component Value Date   CREATININE 0.73 06/16/2023   BUN 16 06/16/2023   NA 140 06/16/2023   K 3.9 06/16/2023   CL 105 06/16/2023   CO2 27 06/16/2023    Lab Results  Component Value Date   ALT 16 02/18/2023   AST 19 02/18/2023   ALKPHOS 71 02/18/2023   BILITOT 0.4 02/18/2023    Lab Results  Component Value Date   TSH 0.74 07/12/2022     Assessment:   No diagnosis found.   Plan:  Blood tests: {blood tests:13147}. Breast self exam technique reviewed and patient encouraged to perform self-exam monthly. Contraception: {contraceptive methods:5051}. Discussed healthy lifestyle modifications. Mammogram {discussed/ordered:14545} Pap smear {discussed/ordered:14545}. Flu vaccine: Follow up in 1 year for annual exam   Lendon Queen, CMA [facility]

## 2024-03-03 ENCOUNTER — Ambulatory Visit (INDEPENDENT_AMBULATORY_CARE_PROVIDER_SITE_OTHER): Payer: PRIVATE HEALTH INSURANCE | Admitting: Obstetrics and Gynecology

## 2024-03-03 ENCOUNTER — Other Ambulatory Visit (HOSPITAL_COMMUNITY)
Admission: RE | Admit: 2024-03-03 | Discharge: 2024-03-03 | Disposition: A | Discharge status: Home / Self Care | Payer: PRIVATE HEALTH INSURANCE | Source: Ambulatory Visit | Attending: Obstetrics and Gynecology | Admitting: Obstetrics and Gynecology

## 2024-03-03 ENCOUNTER — Encounter: Payer: Self-pay | Admitting: Obstetrics and Gynecology

## 2024-03-03 VITALS — BP 122/90 | HR 77 | Resp 16 | Ht 59.0 in | Wt 152.7 lb

## 2024-03-03 DIAGNOSIS — F330 Major depressive disorder, recurrent, mild: Secondary | ICD-10-CM

## 2024-03-03 DIAGNOSIS — R6889 Other general symptoms and signs: Secondary | ICD-10-CM

## 2024-03-03 DIAGNOSIS — Z113 Encounter for screening for infections with a predominantly sexual mode of transmission: Secondary | ICD-10-CM

## 2024-03-03 DIAGNOSIS — Z01419 Encounter for gynecological examination (general) (routine) without abnormal findings: Secondary | ICD-10-CM

## 2024-03-03 DIAGNOSIS — R233 Spontaneous ecchymoses: Secondary | ICD-10-CM | POA: Diagnosis not present

## 2024-03-03 DIAGNOSIS — N921 Excessive and frequent menstruation with irregular cycle: Secondary | ICD-10-CM

## 2024-03-03 DIAGNOSIS — Z13220 Encounter for screening for lipoid disorders: Secondary | ICD-10-CM

## 2024-03-03 DIAGNOSIS — R638 Other symptoms and signs concerning food and fluid intake: Secondary | ICD-10-CM

## 2024-03-03 DIAGNOSIS — Z131 Encounter for screening for diabetes mellitus: Secondary | ICD-10-CM

## 2024-03-03 MED ORDER — MEDROXYPROGESTERONE ACETATE 10 MG PO TABS: 10.0000 mg | ORAL_TABLET | Freq: Every day | ORAL | 2 refills | Status: DC

## 2024-03-03 MED ORDER — SERTRALINE HCL 50 MG PO TABS
50.0000 mg | ORAL_TABLET | Freq: Every day | ORAL | 3 refills | Status: DC
Start: 2024-03-03 — End: 2024-04-07

## 2024-03-03 NOTE — Patient Instructions (Signed)
 Preventive Care 26-26 Years Old, Female Preventive care refers to lifestyle choices and visits with your health care provider that can promote health and wellness. Preventive care visits are also called wellness exams. What can I expect for my preventive care visit? Counseling During your preventive care visit, your health care provider may ask about your: Medical history, including: Past medical problems. Family medical history. Pregnancy history. Current health, including: Menstrual cycle. Method of birth control. Emotional well-being. Home life and relationship well-being. Sexual activity and sexual health. Lifestyle, including: Alcohol, nicotine or tobacco, and drug use. Access to firearms. Diet, exercise, and sleep habits. Work and work Astronomer. Sunscreen use. Safety issues such as seatbelt and bike helmet use. Physical exam Your health care provider may check your: Height and weight. These may be used to calculate your BMI (body mass index). BMI is a measurement that tells if you are at a healthy weight. Waist circumference. This measures the distance around your waistline. This measurement also tells if you are at a healthy weight and may help predict your risk of certain diseases, such as type 2 diabetes and high blood pressure. Heart rate and blood pressure. Body temperature. Skin for abnormal spots. What immunizations do I need?  Vaccines are usually given at various ages, according to a schedule. Your health care provider will recommend vaccines for you based on your age, medical history, and lifestyle or other factors, such as travel or where you work. What tests do I need? Screening Your health care provider may recommend screening tests for certain conditions. This may include: Pelvic exam and Pap test. Lipid and cholesterol levels. Diabetes screening. This is done by checking your blood sugar (glucose) after you have not eaten for a while (fasting). Hepatitis  B test. Hepatitis C test. HIV (human immunodeficiency virus) test. STI (sexually transmitted infection) testing, if you are at risk. BRCA-related cancer screening. This may be done if you have a family history of breast, ovarian, tubal, or peritoneal cancers. Talk with your health care provider about your test results, treatment options, and if necessary, the need for more tests. Follow these instructions at home: Eating and drinking  Eat a healthy diet that includes fresh fruits and vegetables, whole grains, lean protein, and low-fat dairy products. Take vitamin and mineral supplements as recommended by your health care provider. Do not drink alcohol if: Your health care provider tells you not to drink. You are pregnant, may be pregnant, or are planning to become pregnant. If you drink alcohol: Limit how much you have to 0-1 drink a day. Know how much alcohol is in your drink. In the [place] one drink equals one 12 oz bottle of beer (355 mL), one 5 oz glass of wine (148 mL), or one 1 oz glass of hard liquor (44 mL). Lifestyle Brush your teeth every morning and night with fluoride toothpaste. Floss one time each day. Exercise for at least 30 minutes 5 or more days each week. Do not use any products that contain nicotine or tobacco. These products include cigarettes, chewing tobacco, and vaping devices, such as e-cigarettes. If you need help quitting, ask your health care provider. Do not use drugs. If you are sexually active, practice safe sex. Use a condom or other form of protection to prevent STIs. If you do not wish to become pregnant, use a form of birth control. If you plan to become pregnant, see your health care provider for a prepregnancy visit. Find healthy ways to manage stress, such as: Meditation,  yoga, or listening to music. Journaling. Talking to a trusted person. Spending time with friends and family. Minimize exposure to UV radiation to reduce your risk of skin  cancer. Safety Always wear your seat belt while driving or riding in a vehicle. Do not drive: If you have been drinking alcohol. Do not ride with someone who has been drinking. If you have been using any mind-altering substances or drugs. While texting. When you are tired or distracted. Wear a helmet and other protective equipment during sports activities. If you have firearms in your house, make sure you follow all gun safety procedures. Seek help if you have been physically or sexually abused. What's next? Go to your health care provider once a year for an annual wellness visit. Ask your health care provider how often you should have your eyes and teeth checked. Stay up to date on all vaccines. This information is not intended to replace advice given to you by your health care provider. Make sure you discuss any questions you have with your health care provider. Document Revised: 05/02/2021 Document Reviewed: 05/02/2021 Elsevier Patient Education  2024 [employer] Breast Self-Awareness Breast self-awareness is knowing how your breasts look and feel. You need to: Check your breasts on a regular basis. Tell your doctor about any changes. Become familiar with the look and feel of your breasts. This can help you catch a breast problem while it is still small and can be treated. You should do breast self-exams even if you have breast implants. What you need: A mirror. A well-lit room. A pillow or other soft object. How to do a breast self-exam Follow these steps to do a breast self-exam: Look for changes  Take off all the clothes above your waist. Stand in front of a mirror in a room with good lighting. Put your hands down at your sides. Compare your breasts in the mirror. Look for any difference between them, such as: A difference in shape. A difference in size. Wrinkles, dips, and bumps in one breast and not the other. Look at each breast for changes in the skin, such  as: Redness. Scaly areas. Skin that has gotten thicker. Dimpling. Open sores (ulcers). Look for changes in your nipples, such as: Fluid coming out of a nipple. Fluid around a nipple. Bleeding. Dimpling. Redness. A nipple that looks pushed in (retracted), or that has changed position. Feel for changes Lie on your back. Feel each breast. To do this: Pick a breast to feel. Place a pillow under the shoulder closest to that breast. Put the arm closest to that breast behind your head. Feel the nipple area of that breast using the hand of your other arm. Feel the area with the pads of your three middle fingers by making small circles with your fingers. Use light, medium, and firm pressure. Continue the overlapping circles, moving downward over the breast. Keep making circles with your fingers. Stop when you feel your ribs. Start making circles with your fingers again, this time going upward until you reach your collarbone. Then, make circles outward across your breast and into your armpit area. Squeeze your nipple. Check for discharge and lumps. Repeat these steps to check your other breast. Sit or stand in the tub or shower. With soapy water on your skin, feel each breast the same way you did when you were lying down. Write down what you find Writing down what you find can help you remember what to tell your doctor. Write down: What is  normal for each breast. Any changes you find in each breast. These include: The kind of changes you find. A tender or painful breast. Any lump you find. Write down its size and where it is. When you last had your monthly period (menstrual cycle). General tips If you are breastfeeding, the best time to check your breasts is after you feed your baby or after you use a breast pump. If you get monthly bleeding, the best time to check your breasts is 5-7 days after your monthly cycle ends. With time, you will become comfortable with the self-exam. You will  also start to know if there are changes in your breasts. Contact a doctor if: You see a change in the shape or size of your breasts or nipples. You see a change in the skin of your breast or nipples, such as red or scaly skin. You have fluid coming from your nipples that is not normal. You find a new lump or thick area. You have breast pain. You have any concerns about your breast health. Summary Breast self-awareness includes looking for changes in your breasts and feeling for changes within your breasts. You should do breast self-awareness in front of a mirror in a well-lit room. If you get monthly periods (menstrual cycles), the best time to check your breasts is 5-7 days after your period ends. Tell your doctor about any changes you see in your breasts. Changes include changes in size, changes on the skin, painful or tender breasts, or fluid from your nipples that is not normal. This information is not intended to replace advice given to you by your health care provider. Make sure you discuss any questions you have with your health care provider. Document Revised: 04/11/2022 Document Reviewed: 09/06/2021 Elsevier Patient Education  2024 [employer].

## 2024-03-04 ENCOUNTER — Encounter: Payer: Self-pay | Admitting: Obstetrics and Gynecology

## 2024-03-04 LAB — CERVICOVAGINAL ANCILLARY ONLY
Bacterial Vaginitis (gardnerella): NEGATIVE
Candida Glabrata: NEGATIVE
Candida Vaginitis: NEGATIVE
Chlamydia: NEGATIVE
Comment: NEGATIVE
Comment: NEGATIVE
Comment: NEGATIVE
Comment: NEGATIVE
Comment: NEGATIVE
Comment: NORMAL
Neisseria Gonorrhea: NEGATIVE
Trichomonas: NEGATIVE

## 2024-03-04 LAB — CBC
Hematocrit: 40.2 % (ref 34.0–46.6)
Hemoglobin: 13.4 g/dL (ref 11.1–15.9)
MCH: 30.7 pg (ref 26.6–33.0)
MCHC: 33.3 g/dL (ref 31.5–35.7)
MCV: 92 fL (ref 79–97)
Platelets: 216 10*3/uL (ref 150–450)
RBC: 4.36 x10E6/uL (ref 3.77–5.28)
RDW: 12.2 % (ref 11.7–15.4)
WBC: 5.8 10*3/uL (ref 3.4–10.8)

## 2024-03-04 LAB — HEPATITIS C ANTIBODY: Hep C Virus Ab: NONREACTIVE

## 2024-03-04 LAB — TSH: TSH: 1.1 u[IU]/mL (ref 0.450–4.500)

## 2024-03-04 LAB — PROTIME-INR
INR: 0.9 (ref 0.9–1.2)
Prothrombin Time: 10.4 s (ref 9.1–12.0)

## 2024-03-04 LAB — IRON,TIBC AND FERRITIN PANEL
Ferritin: 52 ng/mL (ref 15–150)
Iron Saturation: 13 % — ABNORMAL LOW (ref 15–55)
Iron: 43 ug/dL (ref 27–159)
Total Iron Binding Capacity: 326 ug/dL (ref 250–450)
UIBC: 283 ug/dL (ref 131–425)

## 2024-03-04 LAB — APTT: aPTT: 29 s (ref 24–33)

## 2024-03-04 LAB — HEPATITIS B SURFACE ANTIGEN: Hepatitis B Surface Ag: NEGATIVE

## 2024-03-04 LAB — RPR: RPR Ser Ql: NONREACTIVE

## 2024-03-04 LAB — HIV ANTIBODY (ROUTINE TESTING W REFLEX): HIV Screen 4th Generation wRfx: NONREACTIVE

## 2024-03-26 ENCOUNTER — Encounter: Payer: Self-pay | Admitting: Certified Nurse Midwife

## 2024-03-29 ENCOUNTER — Ambulatory Visit
Admission: RE | Admit: 2024-03-29 | Discharge: 2024-03-29 | Disposition: A | Discharge status: Home / Self Care | Payer: PRIVATE HEALTH INSURANCE | Source: Ambulatory Visit | Attending: Family Medicine | Admitting: Family Medicine

## 2024-03-29 VITALS — BP 110/76 | HR 78 | Temp 98.4°F | Resp 16

## 2024-03-29 DIAGNOSIS — R300 Dysuria: Secondary | ICD-10-CM | POA: Diagnosis present

## 2024-03-29 DIAGNOSIS — N898 Other specified noninflammatory disorders of vagina: Secondary | ICD-10-CM | POA: Insufficient documentation

## 2024-03-29 DIAGNOSIS — B379 Candidiasis, unspecified: Secondary | ICD-10-CM | POA: Diagnosis present

## 2024-03-29 DIAGNOSIS — A749 Chlamydial infection, unspecified: Secondary | ICD-10-CM | POA: Insufficient documentation

## 2024-03-29 DIAGNOSIS — Z202 Contact with and (suspected) exposure to infections with a predominantly sexual mode of transmission: Secondary | ICD-10-CM | POA: Diagnosis not present

## 2024-03-29 DIAGNOSIS — A5901 Trichomonal vulvovaginitis: Secondary | ICD-10-CM | POA: Diagnosis present

## 2024-03-29 LAB — URINALYSIS, W/ REFLEX TO CULTURE (INFECTION SUSPECTED)
Bilirubin Urine: NEGATIVE
Glucose, UA: NEGATIVE mg/dL
Ketones, ur: NEGATIVE mg/dL
Nitrite: NEGATIVE
Protein, ur: NEGATIVE mg/dL
Specific Gravity, Urine: 1.015 (ref 1.005–1.030)
pH: 6 (ref 5.0–8.0)

## 2024-03-29 MED ORDER — SECNIDAZOLE 2 G PO PACK: 1.0000 | PACK | Freq: Once | ORAL | 0 refills | Status: AC

## 2024-03-29 MED ORDER — FLUCONAZOLE 150 MG PO TABS: 150.0000 mg | ORAL_TABLET | Freq: Once | ORAL | 0 refills | Status: AC

## 2024-03-29 NOTE — ED Provider Notes (Signed)
 [facility]    CSN: [id number] Arrival date & time: 03/29/24  1815      History   Chief Complaint Chief Complaint  Patient presents with   Vaginal Itching    Entered by patient     HPI HPI Samantha Ramsey is a 28 y.o. female.    Samantha Ramsey presents for vaginal itching that started Saturday. She started scratching and now has burning with urination.   Tried vaginal itch cream prior to arrival without relief.  Has not had any antibiotics in last 30 days.   Denies known STI exposure.  Samantha Ramsey does use condoms regularly. She is  not currently pregnant.  LMP  03/24/24   - Abnormal vaginal discharge: unsure as also bleeding - vaginal odor: no - vaginal bleeding: yes - Dysuria: yes - Hematuria: menstruating - Urinary urgency:no  - Urinary frequency: yes  - Fever: no - Abdominal pain: no  - Pelvic pain: no - Rash/Skin lesions/mouth ulcers: no - Nausea: yes - Vomiting: no  - Back Pain: no        Past Medical History:  Diagnosis Date   Chlamydia 07/2018   treated   Gonorrhea    12/15/2018, 09/2018.  Treated   History of COVID-19    HSV-1 (herpes simplex virus 1) infection    Serology postiive only. Has never had outbreak   Wears contact lenses    Yeast vaginitis     Patient Active Problem List   Diagnosis Date Noted   Iliotibial band syndrome of left side 07/03/2021   History of miscarriage 06/27/2020   LGSIL on Pap smear of cervix 12/08/2019   Human herpes simplex virus type 1 (HSV-1) DNA detected 12/03/2019   Yeast infection of the vagina 03/17/2018    Past Surgical History:  Procedure Laterality Date   DILATION AND EVACUATION N/A 04/13/2020   Procedure: DILATATION AND EVACUATION;  Surgeon: Teresa Fender, MD;  Location: [facility];  Service: Gynecology;  Laterality: N/A;   MINOR EXCISION EAR CANAL MASS Left 11/01/2021   Procedure: EXCISION LEFT EAR CANAL MASS;  Surgeon: Mellody Sprout, MD;  Location: [facility];  Service: ENT;   Laterality: Left;   WISDOM TOOTH EXTRACTION  10/2016   all four;     OB History     Gravida  2   Para  1   Term  1   Preterm  0   AB  1   Living  1      SAB  1   IAB  0   Ectopic  0   Multiple  0   Live Births  1            Home Medications    Prior to Admission medications   Medication Sig Start Date End Date Taking? Authorizing Provider  levonorgestrel  (KYLEENA ) 19.5 MG IUD 1 each by Intrauterine route once. Inserted 07/10/2021   Yes [provider]  sertraline  (ZOLOFT ) 50 MG tablet Take 1 tablet (50 mg total) by mouth daily. 03/03/24  Yes Teresa Fender, MD  doxycycline  (VIBRA -TABS) 100 MG tablet Take 1 tablet (100 mg total) by mouth 2 (two) times daily for 7 days. 03/31/24 04/07/24  Ann Keto, MD  medroxyPROGESTERone  (PROVERA ) 10 MG tablet Take 1 tablet (10 mg total) by mouth daily. Use for ten days each month starting on Day 15 of cycle. 03/03/24   Teresa Fender, MD    Family History Family History  Problem Relation Age of Onset  Hypertension Mother    Healthy Father    Cervical cancer Maternal Grandmother    Ovarian cancer Maternal Grandmother    Breast cancer Neg Hx    Colon cancer Neg Hx     Social History Social History   Tobacco Use   Smoking status: Never   Smokeless tobacco: Never  Vaping Use   Vaping status: Never Used  Substance Use Topics   Alcohol use: Not Currently    Alcohol/week: 1.0 standard drink of alcohol    Types: 1 Standard drinks or equivalent per week   Drug use: No     Allergies   Flagyl  [metronidazole ]   Review of Systems Review of Systems: :negative unless otherwise stated in HPI.      Physical Exam Triage Vital Signs ED Triage Vitals  Encounter Vitals Group     BP      Systolic BP Percentile      Diastolic BP Percentile      Pulse      Resp      Temp      Temp src      SpO2      Weight      Height      Head Circumference      Peak Flow      Pain Score      Pain Loc       Pain Education      Exclude from Growth Chart    No data found.  Updated Vital Signs BP 110/76 (BP Location: Right Arm)   Pulse 78   Temp 98.4 F (36.9 C) (Oral)   Resp 16   LMP 03/01/2024 (Exact Date)   SpO2 99%   Visual Acuity Right Eye Distance:   Left Eye Distance:   Bilateral Distance:    Right Eye Near:   Left Eye Near:    Bilateral Near:     Physical Exam GEN: well appearing female in no acute distress  CVS: well perfused  RESP: speaking in full sentences without pause  ABD: soft, non-tender, non-distended, no palpable masses, no CVA tenderness GU: deferred, patient performed self swab     UC Treatments / Results  Labs (all labs ordered are listed, but only abnormal results are displayed) Labs Reviewed  URINE CULTURE - Abnormal; Notable for the following components:      Result Value   Culture MULTIPLE SPECIES PRESENT, SUGGEST RECOLLECTION (*)    All other components within normal limits  URINALYSIS, W/ REFLEX TO CULTURE (INFECTION SUSPECTED) - Abnormal; Notable for the following components:   Hgb urine dipstick TRACE (*)    Leukocytes,Ua MODERATE (*)    Bacteria, UA FEW (*)    Trichomonas, UA PRESENT (*)    All other components within normal limits  CERVICOVAGINAL ANCILLARY ONLY - Abnormal; Notable for the following components:   Chlamydia Positive (*)    Candida Vaginitis Positive (*)    All other components within normal limits    EKG   Radiology No results found.  Procedures Procedures (including critical care time)  Medications Ordered in UC Medications - No data to display  Initial Impression / Assessment and Plan / UC Course  I have reviewed the triage vital signs and the nursing notes.  Pertinent labs & imaging results that were available during my care of the patient were reviewed by me and considered in my medical decision making (see chart for details).      Patient is a 28 y.o.Samantha Ramsey female  who presents for vaginal itching and  dysuria for the past 3 days.  Overall patient is well-appearing and afebrile.  Vital signs stable.  Urinalysis not consistent with acute cystitis.   Hematuria not supported on microscopy.  Urinalysis is positive for yeast and trichomonas.  Vaginal swab for yeast vaginitis and bacterial vaginitis, trichomonas, gonorrhea and chlamydia obtained.   - Treatment: As she has a listed allergy to metronidazole  will treat trichomonas with secnidazole 2 g packet - Diflucan  for 2 doses for  yeast infection  - Abstain from coitus during course of treatment.    Return precautions including abdominal pain, fever, chills, nausea, or vomiting given. Discussed MDM, treatment plan and plan for follow-up with patient who agrees with plan.   Per nurses note patient positive for chlamydia as well.  Sent in doxycycline  twice daily for 7 days.    Final Clinical Impressions(s) / UC Diagnoses   Final diagnoses:  Dysuria  Trichomonal vaginitis  Exposure to sexually transmitted disease (STD)  Vaginal itching  Yeast infection  Chlamydia     Discharge Instructions      Stop by the pharmacy to pick up your prescriptions.  Follow up with your primary care provider or return to the urgent care, if not improving.    Your STD test results will be available in the next 72 hours. If positive, someone will contact you.  You should see your results in your MyChart account.     ED Prescriptions     Medication Sig Dispense Auth. Provider   fluconazole  (DIFLUCAN ) 150 MG tablet Take 1 tablet (150 mg total) by mouth once for 1 dose. 1 tablet Agam Tuohy, DO   Secnidazole 2 g PACK Take 1 packet by mouth once for 1 dose. 1 each Rinda Rollyson, DO      PDMP not reviewed this encounter.   Malijah Lietz, DO 03/31/24 1949

## 2024-03-29 NOTE — Discharge Instructions (Signed)
 Stop by the pharmacy to pick up your prescriptions.  Follow up with your primary care provider or return to the urgent care, if not improving.    Your STD test results will be available in the next 72 hours. If positive, someone will contact you.  You should see your results in your MyChart account.

## 2024-03-29 NOTE — ED Triage Notes (Signed)
 Sx x 3 days  Vaginal itching and burning from scratching.

## 2024-03-30 LAB — CERVICOVAGINAL ANCILLARY ONLY
Bacterial Vaginitis (gardnerella): NEGATIVE
Candida Glabrata: NEGATIVE
Candida Vaginitis: POSITIVE — AB
Chlamydia: POSITIVE — AB
Comment: NEGATIVE
Comment: NEGATIVE
Comment: NEGATIVE
Comment: NEGATIVE
Comment: NEGATIVE
Comment: NORMAL
Neisseria Gonorrhea: NEGATIVE
Trichomonas: NEGATIVE

## 2024-03-30 LAB — URINE CULTURE

## 2024-03-31 ENCOUNTER — Ambulatory Visit (HOSPITAL_COMMUNITY): Payer: Self-pay

## 2024-03-31 MED ORDER — DOXYCYCLINE HYCLATE 100 MG PO TABS
100.0000 mg | ORAL_TABLET | Freq: Two times a day (BID) | ORAL | 0 refills | Status: AC
Start: 2024-03-31 — End: 2024-04-07

## 2024-04-07 ENCOUNTER — Encounter: Payer: Self-pay | Admitting: Certified Nurse Midwife

## 2024-04-07 ENCOUNTER — Ambulatory Visit (INDEPENDENT_AMBULATORY_CARE_PROVIDER_SITE_OTHER): Payer: PRIVATE HEALTH INSURANCE | Admitting: Certified Nurse Midwife

## 2024-04-07 DIAGNOSIS — F418 Other specified anxiety disorders: Secondary | ICD-10-CM | POA: Diagnosis not present

## 2024-04-07 MED ORDER — HYDROXYZINE HCL 25 MG PO TABS: 25.0000 mg | ORAL_TABLET | Freq: Every evening | ORAL | 1 refills | Status: DC | PRN

## 2024-04-07 MED ORDER — SERTRALINE HCL 50 MG PO TABS
75.0000 mg | ORAL_TABLET | Freq: Every day | ORAL | 1 refills | Status: AC
Start: 2024-04-07 — End: ?

## 2024-04-07 NOTE — Progress Notes (Signed)
 Virtual Visit via Video Note  I connected with Samantha Ramsey on 04/07/24 at   3:55 PM EDT via telephone and verified that I am speaking with the correct person using two identifiers.  Location: Patient: home Provider: [facility] office   I discussed the limitations of evaluation and management by telemedicine and the availability of in person appointments. The patient expressed understanding and agreed to proceed.    History of Present Illness:   Samantha Ramsey is a 28 y.o. G28P1011 female who presents for follow up on initiation of medication, zoloft , for depression and anxiety. She reports some days are better than others, she has noticed some benefit but still is feeling irritable daily. She reports increased appetite, but is concerned she is overeating at times. Endorses sleep disturbance, waking about every 2h overnight and hard to fall back to sleep with these wakings. Usually goes to bed around 9 to 930pm. Denies SI.      The following portions of the patient's history were reviewed and updated as appropriate: allergies, current medications, past family history, past medical history, past social history, past surgical history, and problem list.   Observations/Objective:   There were no vitals taken for this visit. Constitutional: NAD Psych: Speech even, nonlabored/pressured, demonstrates normal thought content & judgment.       04/07/2024    3:52 PM 12/31/2023    3:47 PM 06/19/2023   11:57 AM 02/06/2022    8:45 AM  GAD 7 : Generalized Anxiety Score  Nervous, Anxious, on Edge 1 0 0 0  Control/stop worrying 0 0 0 0  Worry too much - different things 1 0 0 0  Trouble relaxing 1 0 1 1  Restless 0 0 1 0  Easily annoyed or irritable 3 0 1 1  Afraid - awful might happen 0 0 0 0  Total GAD 7 Score 6 0 3 2  Anxiety Difficulty Not difficult at all Not difficult at all Not difficult at all Somewhat difficult       04/07/2024    3:55 PM 03/03/2024    8:30 AM 12/31/2023    3:46 PM  06/19/2023   11:57 AM 06/16/2023   11:22 AM  Depression screen PHQ 2/9  Decreased Interest 1 2 0 0 0  Down, Depressed, Hopeless 1 1 0 0 0  PHQ - 2 Score 2 3 0 0 0  Altered sleeping 3 2 0 1 0  Tired, decreased energy 3 2 0 1 0  Change in appetite 3 2 0 1 0  Feeling bad or failure about yourself  0 0 0 0 0  Trouble concentrating 2 3 0 1 0  Moving slowly or fidgety/restless 0 2 0 0 0  Suicidal thoughts 0 0 0 0 0  PHQ-9 Score 13 14 0 4 0  Difficult doing work/chores Somewhat difficult Somewhat difficult Not difficult at all Not difficult at all           Assessment and Plan: 1. Mixed anxiety and depressive disorder (Primary)  -Increase zoloft  to 75mg  daily, consider taking in daytime to see if insomnia is improved. -Hydroxyzine 25mg  po at bedtime prn for insomnia.     Follow Up Instructions: Follow up for medication management in one month, sooner if needed.    I discussed the assessment and treatment plan with the patient. The patient was provided an opportunity to ask questions and all were answered. The patient agreed with the plan and demonstrated an understanding of the instructions.  The patient was advised to call back or seek an in-person evaluation if the symptoms worsen or if the condition fails to improve as anticipated.   Forestine Igo, CNM

## 2024-04-08 ENCOUNTER — Encounter: Payer: Self-pay | Admitting: Certified Nurse Midwife

## 2024-04-20 ENCOUNTER — Ambulatory Visit
Admission: RE | Admit: 2024-04-20 | Discharge: 2024-04-20 | Disposition: A | Discharge status: Home / Self Care | Source: Ambulatory Visit | Attending: Physician Assistant | Admitting: Physician Assistant

## 2024-04-20 VITALS — BP 111/77 | HR 82 | Temp 99.4°F | Resp 16

## 2024-04-20 DIAGNOSIS — Z113 Encounter for screening for infections with a predominantly sexual mode of transmission: Secondary | ICD-10-CM

## 2024-04-20 DIAGNOSIS — N760 Acute vaginitis: Secondary | ICD-10-CM

## 2024-04-20 DIAGNOSIS — R300 Dysuria: Secondary | ICD-10-CM

## 2024-04-20 LAB — URINALYSIS, W/ REFLEX TO CULTURE (INFECTION SUSPECTED)
Bilirubin Urine: NEGATIVE
Glucose, UA: NEGATIVE mg/dL
Ketones, ur: NEGATIVE mg/dL
Leukocytes,Ua: NEGATIVE
Nitrite: NEGATIVE
Protein, ur: NEGATIVE mg/dL
Specific Gravity, Urine: 1.015 (ref 1.005–1.030)
WBC, UA: NONE SEEN WBC/hpf (ref 0–5)
pH: 5 (ref 5.0–8.0)

## 2024-04-20 MED ORDER — FLUCONAZOLE 150 MG PO TABS: ORAL_TABLET | ORAL | 0 refills | Status: DC

## 2024-04-20 NOTE — ED Provider Notes (Signed)
 [facility]    CSN: [id number] Arrival date & time: 04/20/24  1819      History   Chief Complaint Chief Complaint  Patient presents with   Vaginal Itching    HPI Samantha Ramsey is a 28 y.o. female presenting for vaginal itching and irritation over the past few days.  Also reports dysuria.  Patient was seen here earlier last month and diagnosed with trichomonas, chlamydia and a yeast infection.  She reports treating herself with all medications as prescribed.  Has not scheduled her reports with her previous partner or any other partners.  Says most of her symptoms have resolved.  She thinks she may have continued yeast infection with due to taking all the antibiotics.  HPI  Past Medical History:  Diagnosis Date   Chlamydia 07/2018   treated   Gonorrhea    12/15/2018, 09/2018.  Treated   History of COVID-19    HSV-1 (herpes simplex virus 1) infection    Serology postiive only. Has never had outbreak   Wears contact lenses    Yeast vaginitis     Patient Active Problem List   Diagnosis Date Noted   Mixed anxiety and depressive disorder 04/07/2024   Iliotibial band syndrome of left side 07/03/2021   History of miscarriage 06/27/2020   LGSIL on Pap smear of cervix 12/08/2019   Human herpes simplex virus type 1 (HSV-1) DNA detected 12/03/2019   Yeast infection of the vagina 03/17/2018    Past Surgical History:  Procedure Laterality Date   DILATION AND EVACUATION N/A 04/13/2020   Procedure: DILATATION AND EVACUATION;  Surgeon: Teresa Fender, MD;  Location: [facility];  Service: Gynecology;  Laterality: N/A;   MINOR EXCISION EAR CANAL MASS Left 11/01/2021   Procedure: EXCISION LEFT EAR CANAL MASS;  Surgeon: Mellody Sprout, MD;  Location: [facility];  Service: ENT;  Laterality: Left;   WISDOM TOOTH EXTRACTION  10/2016   all four;     OB History     Gravida  2   Para  1   Term  1   Preterm  0   AB  1   Living  1      SAB  1   IAB  0    Ectopic  0   Multiple  0   Live Births  1            Home Medications    Prior to Admission medications   Medication Sig Start Date End Date Taking? Authorizing Provider  fluconazole  (DIFLUCAN ) 150 MG tablet Take 1 tab po q72 hr prn yeast infection 04/20/24  Yes Floydene Hy, PA-C  hydrOXYzine  (ATARAX ) 25 MG tablet Take 1 tablet (25 mg total) by mouth at bedtime as needed (insomnia). 04/07/24   Forestine Igo, CNM  levonorgestrel  (KYLEENA ) 19.5 MG IUD 1 each by Intrauterine route once. Inserted 07/10/2021    [provider]  medroxyPROGESTERone  (PROVERA ) 10 MG tablet Take 1 tablet (10 mg total) by mouth daily. Use for ten days each month starting on Day 15 of cycle. 03/03/24   Teresa Fender, MD  sertraline  (ZOLOFT ) 50 MG tablet Take 1.5 tablets (75 mg total) by mouth daily. 04/07/24   Forestine Igo, CNM    Family History Family History  Problem Relation Age of Onset   Hypertension Mother    Healthy Father    Cervical cancer Maternal Grandmother    Ovarian cancer Maternal Grandmother    Breast cancer Neg Hx  Colon cancer Neg Hx     Social History Social History   Tobacco Use   Smoking status: Never   Smokeless tobacco: Never  Vaping Use   Vaping status: Never Used  Substance Use Topics   Alcohol use: Not Currently    Alcohol/week: 1.0 standard drink of alcohol    Types: 1 Standard drinks or equivalent per week   Drug use: No     Allergies   Flagyl  [metronidazole ]   Review of Systems Review of Systems  Constitutional:  Negative for fatigue and fever.  Gastrointestinal:  Negative for abdominal pain.  Genitourinary:  Positive for dysuria. Negative for flank pain, frequency, hematuria, urgency, vaginal bleeding, vaginal discharge and vaginal pain.       +vaginal itching  Musculoskeletal:  Negative for back pain.  Skin:  Negative for rash.     Physical Exam Triage Vital Signs ED Triage Vitals  Encounter Vitals Group     BP 04/20/24  1833 111/77     Systolic BP Percentile --      Diastolic BP Percentile --      Pulse Rate 04/20/24 1833 82     Resp 04/20/24 1833 16     Temp 04/20/24 1833 99.4 F (37.4 C)     Temp Source 04/20/24 1833 Oral     SpO2 04/20/24 1833 98 %     Weight --      Height --      Head Circumference --      Peak Flow --      Pain Score 04/20/24 1832 0     Pain Loc --      Pain Education --      Exclude from Growth Chart --    No data found.  Updated Vital Signs BP 111/77 (BP Location: Right Arm)   Pulse 82   Temp 99.4 F (37.4 C) (Oral)   Resp 16   SpO2 98%      Physical Exam Vitals and nursing note reviewed.  Constitutional:      General: She is not in acute distress.    Appearance: Normal appearance. She is not ill-appearing or toxic-appearing.  HENT:     Head: Normocephalic and atraumatic.  Eyes:     General: No scleral icterus.       Right eye: No discharge.        Left eye: No discharge.     Conjunctiva/sclera: Conjunctivae normal.  Cardiovascular:     Rate and Rhythm: Normal rate and regular rhythm.  Pulmonary:     Effort: Pulmonary effort is normal. No respiratory distress.  Abdominal:     Palpations: Abdomen is soft.     Tenderness: There is no abdominal tenderness.  Musculoskeletal:     Cervical back: Neck supple.  Skin:    General: Skin is dry.  Neurological:     General: No focal deficit present.     Mental Status: She is alert. Mental status is at baseline.     Motor: No weakness.     Gait: Gait normal.  Psychiatric:        Mood and Affect: Mood normal.        Behavior: Behavior normal.      UC Treatments / Results  Labs (all labs ordered are listed, but only abnormal results are displayed) Labs Reviewed  URINALYSIS, W/ REFLEX TO CULTURE (INFECTION SUSPECTED)  CERVICOVAGINAL ANCILLARY ONLY    EKG   Radiology No results found.  Procedures Procedures (including critical care  time)  Medications Ordered in UC Medications - No data to  display  Initial Impression / Assessment and Plan / UC Course  I have reviewed the triage vital signs and the nursing notes.  Pertinent labs & imaging results that were available during my care of the patient were reviewed by me and considered in my medical decision making (see chart for details).   28 year old female presents for dysuria and vaginal itching for the past few days.  Diagnosed with trichomonas, yeast infection and chlamydia last month.  Took all treatment and no new sexual partners or any contact with previous sexual partner.  Patient performs vaginal self swab for GC/chlamydia/trichomonas/BV and yeast.  UA performed. Not consistent with UTI. +yeast present  Will treat at this time for suspected yeast infection with Diflucan .  Will treat accordingly if any other tests are positive on the vaginal swab.  Supportive care encouraged.  Reviewed return precautions.   Final Clinical Impressions(s) / UC Diagnoses   Final diagnoses:  Acute vaginitis  Dysuria  Routine screening for STI (sexually transmitted infection)     Discharge Instructions      The most common types of vaginal infections are yeast infections and bacterial vaginosis. Neither of which are really considered to be sexually transmitted. Often a pH swab or wet prep is performed and if abnormal may reveal either type of infection. Begin metronidazole  if prescribed for possible BV infection. If there is concern for yeast infection, fluconazole  is often prescribed . Take this as directed. You may also apply topical miconazole (can be purchased OTC) externally for relief of itching. Increase rest and fluid intake. If labs sent out, we will call within 2-5 days with results and amend treatment if necessary. Always try to use pH balanced washes/wipes, urinate after intercourse, stay hydrated, and take probiotics if you are prone to vaginal infections. Return or see PCP or gynecologist for new/worsening infections.     ED  Prescriptions     Medication Sig Dispense Auth. Provider   fluconazole  (DIFLUCAN ) 150 MG tablet Take 1 tab po q72 hr prn yeast infection 2 tablet Floydene Hy, PA-C      PDMP not reviewed this encounter.   Floydene Hy, PA-C 04/20/24 1950

## 2024-04-20 NOTE — Discharge Instructions (Signed)

## 2024-04-20 NOTE — ED Triage Notes (Signed)
 Pt presents with vaginal itching and irritation x 5 days.

## 2024-04-21 LAB — CERVICOVAGINAL ANCILLARY ONLY
Bacterial Vaginitis (gardnerella): NEGATIVE
Candida Glabrata: NEGATIVE
Candida Vaginitis: NEGATIVE
Chlamydia: NEGATIVE
Comment: NEGATIVE
Comment: NEGATIVE
Comment: NEGATIVE
Comment: NEGATIVE
Comment: NEGATIVE
Comment: NORMAL
Neisseria Gonorrhea: NEGATIVE
Trichomonas: NEGATIVE

## 2024-04-27 ENCOUNTER — Other Ambulatory Visit: Payer: Self-pay | Admitting: Certified Nurse Midwife

## 2024-04-27 MED ORDER — MEDROXYPROGESTERONE ACETATE 10 MG PO TABS: 10.0000 mg | ORAL_TABLET | Freq: Every day | ORAL | 2 refills | Status: DC

## 2024-04-29 ENCOUNTER — Other Ambulatory Visit: Payer: Self-pay | Admitting: Certified Nurse Midwife

## 2024-07-08 ENCOUNTER — Ambulatory Visit

## 2024-07-12 ENCOUNTER — Ambulatory Visit
Admission: RE | Admit: 2024-07-12 | Discharge: 2024-07-12 | Disposition: A | Discharge status: Home / Self Care | Source: Ambulatory Visit | Attending: Emergency Medicine | Admitting: Emergency Medicine

## 2024-07-12 VITALS — BP 110/80 | HR 77 | Temp 99.1°F | Resp 16 | Ht 59.0 in | Wt 155.0 lb

## 2024-07-12 DIAGNOSIS — Z113 Encounter for screening for infections with a predominantly sexual mode of transmission: Secondary | ICD-10-CM | POA: Diagnosis not present

## 2024-07-12 DIAGNOSIS — N760 Acute vaginitis: Secondary | ICD-10-CM | POA: Diagnosis not present

## 2024-07-12 MED ORDER — MICONAZOLE NITRATE 2 % EX CREA: 1.0000 | TOPICAL_CREAM | Freq: Two times a day (BID) | CUTANEOUS | 0 refills | Status: DC

## 2024-07-12 MED ORDER — FLUCONAZOLE 150 MG PO TABS: 150.0000 mg | ORAL_TABLET | Freq: Once | ORAL | 1 refills | Status: AC

## 2024-07-12 NOTE — Discharge Instructions (Signed)
Take the medication as written. Give [place] a working phone number so that we can contact you if needed. Refrain from sexual contact until all of your labs have come back, symptoms have resolved, and your partner(s) are treated if necessary. Return to the ER if you get worse, have a fever >100.4, or for any concerns.   Go to www.goodrx.com  or www.costplusdrugs.com to look up your medications. This will give you a list of where you can find your prescriptions at the most affordable prices. Or ask the pharmacist what the cash price is, or if they have any other discount programs available to help make your medication more affordable. This can be less expensive than what you would pay with insurance.

## 2024-07-12 NOTE — ED Triage Notes (Signed)
 Pt presents to UC for STD testing c/o vaginal itching x4 days.

## 2024-07-12 NOTE — ED Provider Notes (Signed)
 HPI  SUBJECTIVE:  Samantha Ramsey is a 28 y.o. female who presents with 4 days of vaginal itching.  No nausea, vomiting, fevers, abdominal, back, pelvic pain, vaginal discharge, genital rash, odor, urinary complaints.  She is sexually active with a female, who is asymptomatic.  She does not have any other partners, but is not sure about him.  STDs are a concern today.  No recent antibiotics.  She tried boric acid suppositories without improvement in her symptoms.  No aggravating or alleviating factors.  She has a past medical history of gonorrhea, chlamydia, trichomonas, HSV 1, yeast and BV.  No history of HIV, syphilis.  LMP: 2 weeks ago.  Denies the possibility being pregnant.  Has an IUD.  PCP: In [place].  Past Medical History:  Diagnosis Date   Chlamydia 07/2018   treated   Gonorrhea    12/15/2018, 09/2018.  Treated   History of COVID-19    HSV-1 (herpes simplex virus 1) infection    Serology postiive only. Has never had outbreak   Wears contact lenses    Yeast vaginitis     Past Surgical History:  Procedure Laterality Date   DILATION AND EVACUATION N/A 04/13/2020   Procedure: DILATATION AND EVACUATION;  Surgeon: Connell Davies, MD;  Location: [facility];  Service: Gynecology;  Laterality: N/A;   MINOR EXCISION EAR CANAL MASS Left 11/01/2021   Procedure: EXCISION LEFT EAR CANAL MASS;  Surgeon: Edda Mt, MD;  Location: [facility];  Service: ENT;  Laterality: Left;   WISDOM TOOTH EXTRACTION  10/2016   all four;     Family History  Problem Relation Age of Onset   Hypertension Mother    Healthy Father    Cervical cancer Maternal Grandmother    Ovarian cancer Maternal Grandmother    Breast cancer Neg Hx    Colon cancer Neg Hx     Social History   Tobacco Use   Smoking status: Never   Smokeless tobacco: Never  Vaping Use   Vaping status: Never Used  Substance Use Topics   Alcohol use: Not Currently    Alcohol/week: 1.0 standard drink of alcohol    Types: 1  Standard drinks or equivalent per week   Drug use: No    No current facility-administered medications for this encounter.  Current Outpatient Medications:    fluconazole  (DIFLUCAN ) 150 MG tablet, Take 1 tablet (150 mg total) by mouth once for 1 dose. 1 tab po x 1. May repeat in 72 hours if no improvement, Disp: 2 tablet, Rfl: 1   miconazole  (MICOTIN) 2 % cream, Apply 1 Application topically 2 (two) times daily., Disp: 28.35 g, Rfl: 0   hydrOXYzine  (ATARAX ) 25 MG tablet, TAKE 1 TABLET (25 MG TOTAL) BY MOUTH EVERY DAY AT BEDTIME AS NEEDED FOR INSOMNIA, Disp: 90 tablet, Rfl: 1   levonorgestrel  (KYLEENA ) 19.5 MG IUD, 1 each by Intrauterine route once. Inserted 07/10/2021, Disp: , Rfl:    medroxyPROGESTERone  (PROVERA ) 10 MG tablet, Take 1 tablet (10 mg total) by mouth daily. Use for ten days each month starting on Day 15 of cycle., Disp: 10 tablet, Rfl: 2   sertraline  (ZOLOFT ) 50 MG tablet, Take 1.5 tablets (75 mg total) by mouth daily., Disp: 45 tablet, Rfl: 1  Allergies  Allergen Reactions   Flagyl  [Metronidazole ] Nausea And Vomiting     ROS  As noted in HPI.   Physical Exam  BP 110/80 (BP Location: Left Arm)   Pulse 77   Temp 99.1 F (37.3  C) (Oral)   Resp 16   Ht 4' 11 (1.499 m)   Wt 70.3 kg   SpO2 99%   BMI 31.31 kg/m   Constitutional: Well developed, well nourished, no acute distress Eyes:  EOMI, conjunctiva normal bilaterally HENT: Normocephalic, atraumatic,mucus membranes moist Respiratory: Normal inspiratory effort Cardiovascular: Normal rate GI: nondistended soft, nontender. No suprapubic, lower quadrant tenderness  back: No CVA tenderness GU: Deferred skin: No rash, skin intact Musculoskeletal: no deformities Neurologic: Alert & oriented x 3, no focal neuro deficits Psychiatric: Speech and behavior appropriate   ED Course   Medications - No data to display  Orders Placed This Encounter  Procedures   HIV Antibody (routine testing w rflx)    Standing  Status:   Standing    Number of Occurrences:   1   RPR    Standing Status:   Standing    Number of Occurrences:   1    No results found for this or any previous visit (from the past 24 hours). No results found.  ED Clinical Impression  1. Vaginitis and vulvovaginitis   2. Screening for STDs (sexually transmitted diseases)     ED Assessment/Plan    H&P most c/w yeast infection. Sent off swab for STDs, BV, yeast, HIV, RPR. Will not treat empirically now.  Will send home with diflucan  and miconazole  cream for yeast infection. Advised pt to refrain from sexual contact until all lab results back, symptoms resolve, and partner(s) are treated if necessary. Pt provided working phone number. Follow-up with PCP as needed. Discussed labs, MDM, plan and followup with patient. Pt agrees with plan.   Meds ordered this encounter  Medications   fluconazole  (DIFLUCAN ) 150 MG tablet    Sig: Take 1 tablet (150 mg total) by mouth once for 1 dose. 1 tab po x 1. May repeat in 72 hours if no improvement    Dispense:  2 tablet    Refill:  1   miconazole  (MICOTIN) 2 % cream    Sig: Apply 1 Application topically 2 (two) times daily.    Dispense:  28.35 g    Refill:  0    *This clinic note was created using Scientist, clinical (histocompatibility and immunogenetics). Therefore, there may be occasional mistakes despite careful proofreading.  ?     Van Knee, MD 07/13/24 1622

## 2024-07-13 LAB — HIV ANTIBODY (ROUTINE TESTING W REFLEX): HIV Screen 4th Generation wRfx: NONREACTIVE

## 2024-07-14 LAB — CERVICOVAGINAL ANCILLARY ONLY
Bacterial Vaginitis (gardnerella): POSITIVE — AB
Candida Glabrata: NEGATIVE
Candida Vaginitis: NEGATIVE
Chlamydia: NEGATIVE
Comment: NEGATIVE
Comment: NEGATIVE
Comment: NEGATIVE
Comment: NEGATIVE
Comment: NEGATIVE
Comment: NORMAL
Neisseria Gonorrhea: NEGATIVE
Trichomonas: NEGATIVE

## 2024-07-14 LAB — RPR: RPR Ser Ql: NONREACTIVE

## 2024-07-15 ENCOUNTER — Ambulatory Visit (HOSPITAL_COMMUNITY): Payer: Self-pay

## 2024-07-15 ENCOUNTER — Telehealth: Payer: Self-pay | Admitting: Family Medicine

## 2024-07-15 DIAGNOSIS — B9689 Other specified bacterial agents as the cause of diseases classified elsewhere: Secondary | ICD-10-CM

## 2024-07-15 MED ORDER — CLINDAMYCIN HCL 150 MG PO CAPS: 150.0000 mg | ORAL_CAPSULE | Freq: Two times a day (BID) | ORAL | 0 refills | Status: AC

## 2024-07-15 MED ORDER — METRONIDAZOLE 0.75 % VA GEL: 1.0000 | Freq: Every day | VAGINAL | 0 refills | Status: AC

## 2024-07-15 NOTE — Telephone Encounter (Signed)
 Vaginal swab positive for BV.  She has an allergy to metronidazole .  Clindamycin  tablets sent to the pharmacy.  Caprice Porteous, DO

## 2024-07-24 ENCOUNTER — Other Ambulatory Visit: Payer: Self-pay | Admitting: Certified Nurse Midwife

## 2024-08-13 ENCOUNTER — Encounter: Payer: Self-pay | Admitting: Certified Nurse Midwife

## 2024-10-11 ENCOUNTER — Other Ambulatory Visit (HOSPITAL_COMMUNITY)
Admission: RE | Admit: 2024-10-11 | Discharge: 2024-10-11 | Disposition: A | Discharge status: Home / Self Care | Source: Ambulatory Visit | Attending: Family Medicine | Admitting: Family Medicine

## 2024-10-11 ENCOUNTER — Encounter: Payer: Self-pay | Admitting: Family Medicine

## 2024-10-11 ENCOUNTER — Ambulatory Visit (INDEPENDENT_AMBULATORY_CARE_PROVIDER_SITE_OTHER): Admitting: Family Medicine

## 2024-10-11 VITALS — BP 120/72 | HR 78 | Resp 16 | Ht 59.5 in | Wt 165.1 lb

## 2024-10-11 DIAGNOSIS — Z1159 Encounter for screening for other viral diseases: Secondary | ICD-10-CM | POA: Diagnosis not present

## 2024-10-11 DIAGNOSIS — N393 Stress incontinence (female) (male): Secondary | ICD-10-CM

## 2024-10-11 DIAGNOSIS — Z131 Encounter for screening for diabetes mellitus: Secondary | ICD-10-CM

## 2024-10-11 DIAGNOSIS — Z0000 Encounter for general adult medical examination without abnormal findings: Secondary | ICD-10-CM | POA: Diagnosis not present

## 2024-10-11 DIAGNOSIS — Z23 Encounter for immunization: Secondary | ICD-10-CM

## 2024-10-11 DIAGNOSIS — Z113 Encounter for screening for infections with a predominantly sexual mode of transmission: Secondary | ICD-10-CM | POA: Insufficient documentation

## 2024-10-11 DIAGNOSIS — Z13220 Encounter for screening for lipoid disorders: Secondary | ICD-10-CM | POA: Diagnosis not present

## 2024-10-11 DIAGNOSIS — R635 Abnormal weight gain: Secondary | ICD-10-CM

## 2024-10-11 NOTE — Patient Instructions (Signed)

## 2024-10-11 NOTE — Progress Notes (Signed)
 Name: Samantha Ramsey   MRN: [id number]    DOB: 07-Aug-1996   Date:10/11/2024       Progress Note  Subjective  Chief Complaint  Chief Complaint  Patient presents with   Annual Exam    HPI  Patient presents for annual CPE.  Diet: trying to cook at home more often but still eats out 3-4 times a week, but tries to be mindful - like salads or hibachi  Exercise:   Last Eye Exam: completed Last Dental Exam: completed  Flowsheet Row Office Visit from 10/11/2024 in [facility]  AUDIT-C Score 1   Depression: Phq 9 is  negative    10/11/2024    2:28 PM 04/07/2024    3:55 PM 03/03/2024    8:30 AM 12/31/2023    3:46 PM 06/19/2023   11:57 AM  Depression screen PHQ 2/9  Decreased Interest 0 1 2 0 0  Down, Depressed, Hopeless 0 1 1 0 0  PHQ - 2 Score 0 2 3 0 0  Altered sleeping 0 3 2 0 1  Tired, decreased energy 0 3 2 0 1  Change in appetite 0 3 2 0 1  Feeling bad or failure about yourself  0 0 0 0 0  Trouble concentrating 0 2 3 0 1  Moving slowly or fidgety/restless 0 0 2 0 0  Suicidal thoughts 0 0 0 0 0  PHQ-9 Score 0 13  14  0  4   Difficult doing work/chores Not difficult at all Somewhat difficult Somewhat difficult Not difficult at all Not difficult at all     Data saved with a previous flowsheet row definition   Hypertension: BP Readings from Last 3 Encounters:  10/11/24 120/72  07/12/24 110/80  04/20/24 111/77   Obesity: Wt Readings from Last 3 Encounters:  10/11/24 165 lb 1.6 oz (74.9 kg)  07/12/24 155 lb (70.3 kg)  03/02/24 152 lb 11.2 oz (69.3 kg)   BMI Readings from Last 3 Encounters:  10/11/24 32.79 kg/m  07/12/24 31.31 kg/m  03/02/24 30.84 kg/m     Vaccines: reviewed with the patient. Refused HPV or flu shot  Hep C Screening: completed STD testing and prevention (HIV/chl/gon/syphilis): checking today  Intimate partner violence: negative screen  Sexual History : two partner in the past 12 months, no condoms Menstrual  History/LMP/Abnormal Bleeding: she has an IUD, cycles are light since IUD Discussed importance of follow up if any post-menopausal bleeding: not applicable  Incontinence Symptoms: positive for stress incontinence, discussed Kegel exercise   Breast cancer:  - Last Mammogram: N/A - BRCA gene screening: N/A  Osteoporosis Prevention : Discussed high calcium  and vitamin D supplementation, weight bearing exercises Bone density :not applicable   Cervical cancer screening: up-to-date  Skin cancer: Discussed monitoring for atypical lesions  Colorectal cancer: N/A   Lung cancer:  Low Dose CT Chest recommended if Age 70-80 years, 20 pack-year currently smoking OR have quit w/in 15years. Patient does not qualify for screen   ECG: N/A  Advanced Care Planning: A voluntary discussion about advance care planning including the explanation and discussion of advance directives.  Discussed health care proxy and Living will, and the patient was able to identify a health care proxy as mother .  Patient does not have a living will and power of attorney of health care   Patient Active Problem List   Diagnosis Date Noted   Mixed anxiety and depressive disorder 04/07/2024   Iliotibial band syndrome of left  side 07/03/2021   History of miscarriage 06/27/2020   LGSIL on Pap smear of cervix 12/08/2019   Human herpes simplex virus type 1 (HSV-1) DNA detected 12/03/2019   Yeast infection of the vagina 03/17/2018    Past Surgical History:  Procedure Laterality Date   DILATION AND EVACUATION N/A 04/13/2020   Procedure: DILATATION AND EVACUATION;  Surgeon: Connell Davies, MD;  Location: [facility];  Service: Gynecology;  Laterality: N/A;   MINOR EXCISION EAR CANAL MASS Left 11/01/2021   Procedure: EXCISION LEFT EAR CANAL MASS;  Surgeon: Edda Mt, MD;  Location: [facility];  Service: ENT;  Laterality: Left;   WISDOM TOOTH EXTRACTION  10/2016   all four;     Family History  Problem Relation Age of  Onset   Hypertension Mother    Healthy Father    Cervical cancer Maternal Grandmother    Ovarian cancer Maternal Grandmother    Breast cancer Neg Hx    Colon cancer Neg Hx     Social History   Socioeconomic History   Marital status: Single    Spouse name: Not on file   Number of children: 1   Years of education: Not on file   Highest education level: Some college, no degree  Occupational History   Occupation: full time consulting civil engineer    Occupation: works part time     Comment: Architect as a host   Tobacco Use   Smoking status: Never   Smokeless tobacco: Never  Vaping Use   Vaping status: Never Used  Substance and Sexual Activity   Alcohol use: Yes    Alcohol/week: 1.0 standard drink of alcohol    Types: 1 Standard drinks or equivalent per week    Comment: social   Drug use: No   Sexual activity: Yes    Partners: Male    Birth control/protection: I.U.D.    Comment: Kyleena   Other Topics Concern   Not on file  Social History Narrative   She is going [employer] and is getting a degree in water quality scientist    Social Drivers of Health   Financial Resource Strain: Low Risk  (10/11/2024)   Overall Financial Resource Strain (CARDIA)    Difficulty of Paying Living Expenses: Not hard at all  Food Insecurity: No Food Insecurity (10/11/2024)   Hunger Vital Sign    Worried About Running Out of Food in the Last Year: Never true    Ran Out of Food in the Last Year: Never true  Transportation Needs: No Transportation Needs (10/11/2024)   PRAPARE - Administrator, Civil Service (Medical): No    Lack of Transportation (Non-Medical): No  Physical Activity: Inactive (10/11/2024)   Exercise Vital Sign    Days of Exercise per Week: 0 days    Minutes of Exercise per Session: 0 min  Stress: No Stress Concern Present (10/11/2024)   [employer] of Occupational Health - Occupational Stress Questionnaire    Feeling of Stress: Only a little  Social Connections:  Moderately Isolated (10/11/2024)   Social Connection and Isolation Panel    Frequency of Communication with Friends and Family: More than three times a week    Frequency of Social Gatherings with Friends and Family: More than three times a week    Attends Religious Services: More than 4 times per year    Active Member of [employer] or Organizations: No    Attends Banker Meetings: Never    Marital Status: Never married  Catering Manager  Violence: Not At Risk (10/11/2024)   Humiliation, Afraid, Rape, and Kick questionnaire    Fear of Current or Ex-Partner: No    Emotionally Abused: No    Physically Abused: No    Sexually Abused: No     Current Outpatient Medications:    hydrOXYzine  (ATARAX ) 25 MG tablet, TAKE 1 TABLET (25 MG TOTAL) BY MOUTH EVERY DAY AT BEDTIME AS NEEDED FOR INSOMNIA, Disp: 90 tablet, Rfl: 1   levonorgestrel  (KYLEENA ) 19.5 MG IUD, 1 each by Intrauterine route once. Inserted 07/10/2021, Disp: , Rfl:    medroxyPROGESTERone  (PROVERA ) 10 MG tablet, TAKE 1 TABLET (10 MG TOTAL) BY MOUTH DAILY. USE FOR TEN DAYS EACH MONTH STARTING ON DAY 15 OF CYCLE., Disp: 30 tablet, Rfl: 2   sertraline  (ZOLOFT ) 50 MG tablet, Take 1.5 tablets (75 mg total) by mouth daily., Disp: 45 tablet, Rfl: 1   miconazole  (MICOTIN) 2 % cream, Apply 1 Application topically 2 (two) times daily. (Patient not taking: Reported on 10/11/2024), Disp: 28.35 g, Rfl: 0  Allergies  Allergen Reactions   Flagyl  [Metronidazole ] Nausea And Vomiting     ROS  Ten systems reviewed and is negative except as mentioned in HPI    Objective  Vitals:   10/11/24 1431  BP: 120/72  Pulse: 78  Resp: 16  SpO2: 100%  Weight: 165 lb 1.6 oz (74.9 kg)  Height: 4' 11.5 (1.511 m)    Body mass index is 32.79 kg/m.  Physical Exam  Constitutional: Patient appears well-developed and well-nourished. No distress.  HENT: Head: Normocephalic and atraumatic. Ears: B TMs ok, no erythema or effusion; Nose: Nose normal.  Mouth/Throat: Oropharynx is clear and moist. No oropharyngeal exudate.  Eyes: Conjunctivae and EOM are normal. Pupils are equal, round, and reactive to light. No scleral icterus.  Neck: Normal range of motion. Neck supple. No JVD present. No thyromegaly present.  Cardiovascular: Normal rate, regular rhythm and normal heart sounds.  No murmur heard. No BLE edema. Pulmonary/Chest: Effort normal and breath sounds normal. No respiratory distress. Abdominal: Soft. Bowel sounds are normal, no distension. There is no tenderness. no masses Breast: no lumps or masses, no nipple discharge or rashes FEMALE GENITALIA:  Not done  RECTAL: not done  Musculoskeletal: Normal range of motion, no joint effusions. No gross deformities Neurological: he is alert and oriented to person, place, and time. No cranial nerve deficit. Coordination, balance, strength, speech and gait are normal.  Skin: Skin is warm and dry. No rash noted. No erythema.  Psychiatric: Patient has a normal mood and affect. behavior is normal. Judgment and thought content normal.     Assessment & Plan  1. Well adult exam (Primary)  - Cervicovaginal ancillary only - Lipid panel - Hepatitis B surface antibody,qualitative - Hemoglobin A1c - RPR W/RFLX TO RPR TITER, TREPONEMAL AB, SCREEN AND DIAGNOSIS - HIV Antibody (routine testing w rflx) - CBC with Differential/Platelet - Comprehensive metabolic panel with GFR  2. Screen for STD (sexually transmitted disease)  - Cervicovaginal ancillary only - RPR W/RFLX TO RPR TITER, TREPONEMAL AB, SCREEN AND DIAGNOSIS - HIV Antibody (routine testing w rflx)  3. Diabetes mellitus screening - Hemoglobin A1c  4. Lipid screening  - Lipid panel  5. Need for hepatitis B screening test  - Hepatitis B surface antibody,qualitative  6. Need for HPV vaccine  Refused   7. Weight gain  - TSH  8. Stress incontinence  - Ambulatory referral to Physical Therapy   -USPSTF grade A and B  recommendations reviewed with  patient; age-appropriate recommendations, preventive care, screening tests, etc discussed and encouraged; healthy living encouraged; see AVS for patient education given to patient -Discussed importance of 150 minutes of physical activity weekly, eat two servings of fish weekly, eat one serving of tree nuts ( cashews, pistachios, pecans, almonds.SABRA) every other day, eat 6 servings of fruit/vegetables daily and drink plenty of water and avoid sweet beverages.   -Reviewed Health Maintenance: Yes.

## 2024-10-12 LAB — CBC WITH DIFFERENTIAL/PLATELET
Absolute Lymphocytes: 2957 {cells}/uL (ref 850–3900)
Absolute Monocytes: 454 {cells}/uL (ref 200–950)
Basophils Absolute: 23 {cells}/uL (ref 0–200)
Basophils Relative: 0.3 %
Eosinophils Absolute: 54 {cells}/uL (ref 15–500)
Eosinophils Relative: 0.7 %
HCT: 41.7 % (ref 35.9–46.0)
Hemoglobin: 13.8 g/dL (ref 11.7–15.5)
MCH: 30.3 pg (ref 27.0–33.0)
MCHC: 33.1 g/dL (ref 31.6–35.4)
MCV: 91.6 fL (ref 81.4–101.7)
MPV: 10.2 fL (ref 7.5–12.5)
Monocytes Relative: 5.9 %
Neutro Abs: 4212 {cells}/uL (ref 1500–7800)
Neutrophils Relative %: 54.7 %
Platelets: 233 Thousand/uL (ref 140–400)
RBC: 4.55 Million/uL (ref 3.80–5.10)
RDW: 12.5 % (ref 11.0–15.0)
Total Lymphocyte: 38.4 %
WBC: 7.7 Thousand/uL (ref 3.8–10.8)

## 2024-10-12 LAB — SYPHILIS: RPR W/REFLEX TO RPR TITER AND TREPONEMAL ANTIBODIES, TRADITIONAL SCREENING AND DIAGNOSIS ALGORITHM: RPR Ser Ql: NONREACTIVE

## 2024-10-12 LAB — HEPATITIS B SURFACE ANTIBODY,QUALITATIVE: Hep B S Ab: NONREACTIVE

## 2024-10-12 LAB — LIPID PANEL
Cholesterol: 204 mg/dL — ABNORMAL HIGH (ref <200)
HDL: 64 mg/dL (ref >=50)
LDL Cholesterol (Calc): 111 mg/dL — ABNORMAL HIGH
Non-HDL Cholesterol (Calc): 140 mg/dL — ABNORMAL HIGH (ref <130)
Total CHOL/HDL Ratio: 3.2 (calc) (ref <5.0)
Triglycerides: 177 mg/dL — ABNORMAL HIGH (ref <150)

## 2024-10-12 LAB — COMPREHENSIVE METABOLIC PANEL WITH GFR
AG Ratio: 1.6 (calc) (ref 1.0–2.5)
ALT: 16 U/L (ref 6–29)
AST: 18 U/L (ref 10–30)
Albumin: 4.5 g/dL (ref 3.6–5.1)
Alkaline phosphatase (APISO): 61 U/L (ref 31–125)
BUN: 16 mg/dL (ref 7–25)
CO2: 30 mmol/L (ref 20–32)
Calcium: 9.7 mg/dL (ref 8.6–10.2)
Chloride: 103 mmol/L (ref 98–110)
Creat: 0.77 mg/dL (ref 0.50–0.96)
Globulin: 2.8 g/dL (ref 1.9–3.7)
Glucose, Bld: 84 mg/dL (ref 65–99)
Potassium: 4.2 mmol/L (ref 3.5–5.3)
Sodium: 140 mmol/L (ref 135–146)
Total Bilirubin: 0.4 mg/dL (ref 0.2–1.2)
Total Protein: 7.3 g/dL (ref 6.1–8.1)
eGFR: 109 mL/min/1.73m2 (ref >=60)

## 2024-10-12 LAB — TSH: TSH: 0.98 m[IU]/L

## 2024-10-12 LAB — HEMOGLOBIN A1C
Hgb A1c MFr Bld: 5.4 % (ref <5.7)
Mean Plasma Glucose: 108 mg/dL
eAG (mmol/L): 6 mmol/L

## 2024-10-12 LAB — HIV ANTIBODY (ROUTINE TESTING W REFLEX)
HIV 1&2 Ab, 4th Generation: NONREACTIVE
HIV FINAL INTERPRETATION: NEGATIVE

## 2024-10-13 ENCOUNTER — Other Ambulatory Visit: Payer: Self-pay | Admitting: Family Medicine

## 2024-10-13 ENCOUNTER — Ambulatory Visit: Payer: Self-pay | Admitting: Family Medicine

## 2024-10-13 LAB — CERVICOVAGINAL ANCILLARY ONLY
Bacterial Vaginitis (gardnerella): NEGATIVE
Candida Glabrata: NEGATIVE
Candida Vaginitis: POSITIVE — AB
Chlamydia: NEGATIVE
Comment: NEGATIVE
Comment: NEGATIVE
Comment: NEGATIVE
Comment: NEGATIVE
Comment: NEGATIVE
Comment: NORMAL
Neisseria Gonorrhea: NEGATIVE
Trichomonas: NEGATIVE

## 2024-10-13 MED ORDER — FLUCONAZOLE 150 MG PO TABS: 150.0000 mg | ORAL_TABLET | ORAL | 0 refills | Status: AC

## 2025-10-17 ENCOUNTER — Encounter: Admitting: Family Medicine
